# Patient Record
Sex: Male | Born: 2000
Health system: Southern US, Community
[De-identification: ages and names within clinical notes are randomized; demographics above are authoritative.]

## PROBLEM LIST (undated history)

## (undated) DIAGNOSIS — R0902 Hypoxemia: Secondary | ICD-10-CM

## (undated) DIAGNOSIS — D573 Sickle-cell trait: Secondary | ICD-10-CM

## (undated) DIAGNOSIS — F840 Autistic disorder: Secondary | ICD-10-CM

## (undated) DIAGNOSIS — T85598A Other mechanical complication of other gastrointestinal prosthetic devices, implants and grafts, initial encounter: Secondary | ICD-10-CM

## (undated) DIAGNOSIS — Z0189 Encounter for other specified special examinations: Secondary | ICD-10-CM

## (undated) DIAGNOSIS — Z978 Presence of other specified devices: Secondary | ICD-10-CM

## (undated) DIAGNOSIS — R569 Unspecified convulsions: Secondary | ICD-10-CM

## (undated) HISTORY — DX: Sickle-cell trait: D57.3

## (undated) HISTORY — DX: Autistic disorder: F84.0

## (undated) HISTORY — DX: Presence of other specified devices: Z97.8

## (undated) HISTORY — DX: Encounter for other specified special examinations: Z01.89

## (undated) HISTORY — DX: Other mechanical complication of other gastrointestinal prosthetic devices, implants and grafts, initial encounter: T85.598A

## (undated) HISTORY — DX: Hypoxemia: R09.02

---
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
## 2018-01-10 ENCOUNTER — Other Ambulatory Visit: Payer: Self-pay

## 2018-01-10 MED ORDER — LEVETIRACETAM 500 MG PO TABS: 500.0000 mg | ORAL_TABLET | Freq: Two times a day (BID) | ORAL | 3 refills | Status: DC

## 2018-01-10 NOTE — Telephone Encounter (Signed)
Pts mother called nurse line requesting a refill on pts Keppra. Please advise.

## 2018-01-14 ENCOUNTER — Inpatient Hospital Stay (HOSPITAL_COMMUNITY)
Admission: EM | Admit: 2018-01-14 | Discharge: 2018-01-19 | DRG: 208 | Disposition: A | Discharge status: Home / Self Care | Payer: Medicaid Other | Attending: Family Medicine | Admitting: Family Medicine

## 2018-01-14 ENCOUNTER — Emergency Department (HOSPITAL_COMMUNITY): Payer: Medicaid Other

## 2018-01-14 ENCOUNTER — Inpatient Hospital Stay (HOSPITAL_COMMUNITY): Payer: Medicaid Other

## 2018-01-14 ENCOUNTER — Encounter (HOSPITAL_COMMUNITY): Payer: Self-pay | Admitting: Emergency Medicine

## 2018-01-14 ENCOUNTER — Other Ambulatory Visit: Payer: Self-pay

## 2018-01-14 DIAGNOSIS — I959 Hypotension, unspecified: Secondary | ICD-10-CM | POA: Diagnosis present

## 2018-01-14 DIAGNOSIS — F840 Autistic disorder: Secondary | ICD-10-CM

## 2018-01-14 DIAGNOSIS — K5900 Constipation, unspecified: Secondary | ICD-10-CM | POA: Diagnosis not present

## 2018-01-14 DIAGNOSIS — D571 Sickle-cell disease without crisis: Secondary | ICD-10-CM | POA: Diagnosis not present

## 2018-01-14 DIAGNOSIS — G40909 Epilepsy, unspecified, not intractable, without status epilepticus: Secondary | ICD-10-CM | POA: Diagnosis present

## 2018-01-14 DIAGNOSIS — Z978 Presence of other specified devices: Secondary | ICD-10-CM

## 2018-01-14 DIAGNOSIS — E876 Hypokalemia: Secondary | ICD-10-CM | POA: Diagnosis not present

## 2018-01-14 DIAGNOSIS — E872 Acidosis: Secondary | ICD-10-CM | POA: Diagnosis not present

## 2018-01-14 DIAGNOSIS — Z8249 Family history of ischemic heart disease and other diseases of the circulatory system: Secondary | ICD-10-CM

## 2018-01-14 DIAGNOSIS — D573 Sickle-cell trait: Secondary | ICD-10-CM | POA: Diagnosis present

## 2018-01-14 DIAGNOSIS — R000 Tachycardia, unspecified: Secondary | ICD-10-CM | POA: Diagnosis not present

## 2018-01-14 DIAGNOSIS — J9601 Acute respiratory failure with hypoxia: Secondary | ICD-10-CM | POA: Diagnosis not present

## 2018-01-14 DIAGNOSIS — Z825 Family history of asthma and other chronic lower respiratory diseases: Secondary | ICD-10-CM | POA: Diagnosis not present

## 2018-01-14 DIAGNOSIS — R042 Hemoptysis: Secondary | ICD-10-CM | POA: Diagnosis present

## 2018-01-14 DIAGNOSIS — Z79899 Other long term (current) drug therapy: Secondary | ICD-10-CM | POA: Diagnosis not present

## 2018-01-14 DIAGNOSIS — R0603 Acute respiratory distress: Secondary | ICD-10-CM | POA: Diagnosis present

## 2018-01-14 DIAGNOSIS — R739 Hyperglycemia, unspecified: Secondary | ICD-10-CM | POA: Diagnosis not present

## 2018-01-14 DIAGNOSIS — Z781 Physical restraint status: Secondary | ICD-10-CM

## 2018-01-14 DIAGNOSIS — I10 Essential (primary) hypertension: Secondary | ICD-10-CM | POA: Diagnosis not present

## 2018-01-14 DIAGNOSIS — J189 Pneumonia, unspecified organism: Secondary | ICD-10-CM | POA: Diagnosis present

## 2018-01-14 DIAGNOSIS — J9690 Respiratory failure, unspecified, unspecified whether with hypoxia or hypercapnia: Secondary | ICD-10-CM

## 2018-01-14 DIAGNOSIS — R40241 Glasgow coma scale score 13-15, unspecified time: Secondary | ICD-10-CM | POA: Diagnosis present

## 2018-01-14 DIAGNOSIS — R569 Unspecified convulsions: Secondary | ICD-10-CM

## 2018-01-14 DIAGNOSIS — Z9289 Personal history of other medical treatment: Secondary | ICD-10-CM

## 2018-01-14 HISTORY — DX: Hemoptysis: R04.2

## 2018-01-14 LAB — I-STAT VENOUS BLOOD GAS, ED
Acid-base deficit: 2 mmol/L (ref 0.0–2.0)
Bicarbonate: 26.1 mmol/L (ref 20.0–28.0)
O2 Saturation: 59 %
TCO2: 28 mmol/L (ref 22–32)
pCO2, Ven: 55 mmHg (ref 44.0–60.0)
pH, Ven: 7.284 (ref 7.250–7.430)
pO2, Ven: 35 mmHg (ref 32.0–45.0)

## 2018-01-14 LAB — I-STAT ARTERIAL BLOOD GAS, ED
ACID-BASE DEFICIT: 5 mmol/L — AB (ref 0.0–2.0)
Bicarbonate: 25.4 mmol/L (ref 20.0–28.0)
O2 Saturation: 100 %
PO2 ART: 224 mmHg — AB (ref 83.0–108.0)
TCO2: 27 mmol/L (ref 22–32)
pCO2 arterial: 67.4 mmHg (ref 32.0–48.0)
pH, Arterial: 7.183 — CL (ref 7.350–7.450)

## 2018-01-14 LAB — COMPREHENSIVE METABOLIC PANEL
ALK PHOS: 92 U/L (ref 38–126)
ALT: 33 U/L (ref 0–44)
ANION GAP: 9 (ref 5–15)
AST: 33 U/L (ref 15–41)
Albumin: 3.6 g/dL (ref 3.5–5.0)
BUN: 9 mg/dL (ref 6–20)
CALCIUM: 9.3 mg/dL (ref 8.9–10.3)
CO2: 26 mmol/L (ref 22–32)
CREATININE: 1.08 mg/dL (ref 0.61–1.24)
Chloride: 103 mmol/L (ref 98–111)
GFR calc Af Amer: >60 mL/min (ref >60)
Glucose, Bld: 111 mg/dL — ABNORMAL HIGH (ref 70–99)
Potassium: 4 mmol/L (ref 3.5–5.1)
Sodium: 138 mmol/L (ref 135–145)
TOTAL PROTEIN: 7.1 g/dL (ref 6.5–8.1)
Total Bilirubin: 0.5 mg/dL (ref 0.3–1.2)

## 2018-01-14 LAB — CBC WITH DIFFERENTIAL/PLATELET
Abs Immature Granulocytes: 0.1 10*3/uL (ref 0.0–0.1)
BASOS ABS: 0 10*3/uL (ref 0.0–0.1)
BASOS PCT: 0 %
EOS ABS: 0 10*3/uL (ref 0.0–0.7)
Eosinophils Relative: 0 %
HCT: 47.1 % (ref 39.0–52.0)
Hemoglobin: 15 g/dL (ref 13.0–17.0)
IMMATURE GRANULOCYTES: 1 %
Lymphocytes Relative: 32 %
Lymphs Abs: 3 10*3/uL (ref 0.7–4.0)
MCH: 24.9 pg — ABNORMAL LOW (ref 26.0–34.0)
MCHC: 31.8 g/dL (ref 30.0–36.0)
MCV: 78.1 fL (ref 78.0–100.0)
MONOS PCT: 8 %
Monocytes Absolute: 0.8 10*3/uL (ref 0.1–1.0)
NEUTROS PCT: 59 %
Neutro Abs: 5.4 10*3/uL (ref 1.7–7.7)
PLATELETS: 292 10*3/uL (ref 150–400)
RBC: 6.03 MIL/uL — AB (ref 4.22–5.81)
RDW: 15.9 % — AB (ref 11.5–15.5)
WBC: 9.2 10*3/uL (ref 4.0–10.5)

## 2018-01-14 LAB — GLUCOSE, CAPILLARY: GLUCOSE-CAPILLARY: 126 mg/dL — AB (ref 70–99)

## 2018-01-14 LAB — C-REACTIVE PROTEIN: CRP: 0.8 mg/dL (ref <1.0)

## 2018-01-14 LAB — PROTIME-INR
INR: 1.12
PROTHROMBIN TIME: 14.3 s (ref 11.4–15.2)

## 2018-01-14 LAB — D-DIMER, QUANTITATIVE (NOT AT ARMC): D DIMER QUANT: 4.47 ug{FEU}/mL — AB (ref 0.00–0.50)

## 2018-01-14 LAB — TRIGLYCERIDES: Triglycerides: 184 mg/dL — ABNORMAL HIGH (ref <150)

## 2018-01-14 LAB — LACTIC ACID, PLASMA: LACTIC ACID, VENOUS: 2.9 mmol/L — AB (ref 0.5–1.9)

## 2018-01-14 LAB — PROCALCITONIN

## 2018-01-14 LAB — I-STAT CG4 LACTIC ACID, ED: Lactic Acid, Venous: 3.35 mmol/L (ref 0.5–1.9)

## 2018-01-14 LAB — SEDIMENTATION RATE: Sed Rate: 1 mm/hr (ref 0–16)

## 2018-01-14 MED ORDER — FENTANYL CITRATE (PF) 100 MCG/2ML IJ SOLN: 50.0000 ug | Freq: Once | INTRAMUSCULAR | Status: DC

## 2018-01-14 MED ORDER — DOCUSATE SODIUM 50 MG/5ML PO LIQD: 100.0000 mg | Freq: Two times a day (BID) | ORAL | Status: DC | PRN

## 2018-01-14 MED ORDER — SODIUM CHLORIDE 0.9 % IV SOLN: 1.0000 g | Freq: Once | INTRAVENOUS | Status: DC

## 2018-01-14 MED ORDER — PROPOFOL 10 MG/ML IV BOLUS
INTRAVENOUS | Status: AC
  Filled 2018-01-14: qty 20

## 2018-01-14 MED ORDER — FENTANYL BOLUS VIA INFUSION
50.0000 ug | INTRAVENOUS | Status: DC | PRN
  Administered 2018-01-15 – 2018-01-17 (×6): 50 ug via INTRAVENOUS
  Filled 2018-01-14: qty 50

## 2018-01-14 MED ORDER — VANCOMYCIN HCL IN DEXTROSE 1-5 GM/200ML-% IV SOLN: 1000.0000 mg | Freq: Three times a day (TID) | INTRAVENOUS | Status: DC

## 2018-01-14 MED ORDER — PROPOFOL 1000 MG/100ML IV EMUL
5.0000 ug/kg/min | INTRAVENOUS | Status: DC
  Administered 2018-01-14: 1 mL via INTRAVENOUS
  Administered 2018-01-14: 20 ug/kg/min via INTRAVENOUS
  Filled 2018-01-14: qty 100

## 2018-01-14 MED ORDER — ROCURONIUM BROMIDE 50 MG/5ML IV SOLN
100.0000 mg | Freq: Once | INTRAVENOUS | Status: AC
  Administered 2018-01-14: 100 mg via INTRAVENOUS

## 2018-01-14 MED ORDER — IOPAMIDOL (ISOVUE-370) INJECTION 76%
100.0000 mL | Freq: Once | INTRAVENOUS | Status: AC | PRN
  Administered 2018-01-14: 79 mL via INTRAVENOUS

## 2018-01-14 MED ORDER — PIPERACILLIN-TAZOBACTAM 3.375 G IVPB 30 MIN
3.3750 g | Freq: Once | INTRAVENOUS | Status: AC
  Administered 2018-01-14: 3.375 g via INTRAVENOUS
  Filled 2018-01-14: qty 50

## 2018-01-14 MED ORDER — SODIUM CHLORIDE 0.9 % IV SOLN: 500.0000 mg | Freq: Once | INTRAVENOUS | Status: DC

## 2018-01-14 MED ORDER — SODIUM CHLORIDE 0.9 % IV BOLUS
500.0000 mL | Freq: Once | INTRAVENOUS | Status: AC
  Administered 2018-01-14: 500 mL via INTRAVENOUS

## 2018-01-14 MED ORDER — MIDAZOLAM HCL 2 MG/2ML IJ SOLN
2.0000 mg | INTRAMUSCULAR | Status: DC | PRN
  Administered 2018-01-15: 2 mg via INTRAVENOUS
  Filled 2018-01-14 (×2): qty 2

## 2018-01-14 MED ORDER — LORAZEPAM 2 MG/ML IJ SOLN: 0.5000 mg | Freq: Four times a day (QID) | INTRAMUSCULAR | Status: DC | PRN

## 2018-01-14 MED ORDER — PROPOFOL 1000 MG/100ML IV EMUL
INTRAVENOUS | Status: AC
  Filled 2018-01-14: qty 100

## 2018-01-14 MED ORDER — SODIUM CHLORIDE 0.9 % IV SOLN: INTRAVENOUS | Status: DC | PRN

## 2018-01-14 MED ORDER — LEVETIRACETAM IN NACL 500 MG/100ML IV SOLN
500.0000 mg | Freq: Once | INTRAVENOUS | Status: DC
  Filled 2018-01-14: qty 100

## 2018-01-14 MED ORDER — SODIUM CHLORIDE 0.9 % IV SOLN
2000.0000 mg | Freq: Once | INTRAVENOUS | Status: DC
  Filled 2018-01-14: qty 2000

## 2018-01-14 MED ORDER — PROPOFOL 10 MG/ML IV BOLUS
100.0000 mg | Freq: Once | INTRAVENOUS | Status: AC
  Administered 2018-01-14: 100 mg via INTRAVENOUS

## 2018-01-14 MED ORDER — PIPERACILLIN-TAZOBACTAM 3.375 G IVPB
3.3750 g | Freq: Three times a day (TID) | INTRAVENOUS | Status: DC
  Administered 2018-01-15 – 2018-01-18 (×10): 3.375 g via INTRAVENOUS
  Filled 2018-01-14 (×11): qty 50

## 2018-01-14 MED ORDER — FENTANYL 2500MCG IN NS 250ML (10MCG/ML) PREMIX INFUSION
0.0000 ug/h | INTRAVENOUS | Status: DC
Start: 2018-01-14 — End: 2018-01-14

## 2018-01-14 MED ORDER — LORAZEPAM 2 MG/ML IJ SOLN
INTRAMUSCULAR | Status: AC
  Filled 2018-01-14: qty 1

## 2018-01-14 MED ORDER — FENTANYL 2500MCG IN NS 250ML (10MCG/ML) PREMIX INFUSION
25.0000 ug/h | INTRAVENOUS | Status: DC
  Administered 2018-01-14 – 2018-01-15 (×2): 400 ug/h via INTRAVENOUS
  Administered 2018-01-15: 300 ug/h via INTRAVENOUS
  Administered 2018-01-15: 400 ug/h via INTRAVENOUS
  Administered 2018-01-16: 300 ug/h via INTRAVENOUS
  Administered 2018-01-16: 175 ug/h via INTRAVENOUS
  Administered 2018-01-17: 250 ug/h via INTRAVENOUS
  Filled 2018-01-14 (×6): qty 250

## 2018-01-14 MED ORDER — LEVETIRACETAM IN NACL 500 MG/100ML IV SOLN
500.0000 mg | Freq: Two times a day (BID) | INTRAVENOUS | Status: DC
  Administered 2018-01-14 – 2018-01-15 (×2): 500 mg via INTRAVENOUS
  Filled 2018-01-14 (×2): qty 100

## 2018-01-14 MED ORDER — SODIUM CHLORIDE 0.9 % IV SOLN
INTRAVENOUS | Status: DC
  Administered 2018-01-14 – 2018-01-16 (×4): via INTRAVENOUS

## 2018-01-14 MED ORDER — PIPERACILLIN-TAZOBACTAM 3.375 G IVPB: 3.3750 g | Freq: Three times a day (TID) | INTRAVENOUS | Status: DC

## 2018-01-14 MED ORDER — FENTANYL CITRATE (PF) 100 MCG/2ML IJ SOLN
50.0000 ug | Freq: Once | INTRAMUSCULAR | Status: DC
  Filled 2018-01-14: qty 2

## 2018-01-14 MED ORDER — FENTANYL CITRATE (PF) 100 MCG/2ML IJ SOLN
100.0000 ug | Freq: Once | INTRAMUSCULAR | Status: AC
  Administered 2018-01-14: 100 ug via INTRAVENOUS
  Filled 2018-01-14: qty 2

## 2018-01-14 MED ORDER — PROPOFOL 1000 MG/100ML IV EMUL
0.0000 ug/kg/min | INTRAVENOUS | Status: DC
  Administered 2018-01-14 – 2018-01-15 (×4): 50 ug/kg/min via INTRAVENOUS
  Filled 2018-01-14 (×2): qty 100

## 2018-01-14 MED ORDER — FENTANYL 2500MCG IN NS 250ML (10MCG/ML) PREMIX INFUSION
25.0000 ug/h | INTRAVENOUS | Status: DC
  Administered 2018-01-14: 100 ug/h via INTRAVENOUS
  Filled 2018-01-14: qty 250

## 2018-01-14 MED ORDER — SODIUM CHLORIDE 0.9 % IV SOLN: 250.0000 mL | INTRAVENOUS | Status: DC | PRN

## 2018-01-14 MED ORDER — FENTANYL BOLUS VIA INFUSION
50.0000 ug | INTRAVENOUS | Status: DC | PRN
  Filled 2018-01-14: qty 50

## 2018-01-14 MED ORDER — MIDAZOLAM HCL 2 MG/2ML IJ SOLN: 2.0000 mg | INTRAMUSCULAR | Status: DC | PRN

## 2018-01-14 MED ORDER — IPRATROPIUM-ALBUTEROL 0.5-2.5 (3) MG/3ML IN SOLN
RESPIRATORY_TRACT | Status: AC
  Administered 2018-01-14: 3 mL via RESPIRATORY_TRACT
  Filled 2018-01-14: qty 3

## 2018-01-14 MED ORDER — IPRATROPIUM-ALBUTEROL 0.5-2.5 (3) MG/3ML IN SOLN
3.0000 mL | Freq: Once | RESPIRATORY_TRACT | Status: AC
  Administered 2018-01-14: 3 mL via RESPIRATORY_TRACT

## 2018-01-14 MED ORDER — METHYLPREDNISOLONE SODIUM SUCC 125 MG IJ SOLR
125.0000 mg | Freq: Four times a day (QID) | INTRAMUSCULAR | Status: DC
  Administered 2018-01-14 – 2018-01-17 (×11): 125 mg via INTRAVENOUS
  Filled 2018-01-14 (×12): qty 2

## 2018-01-14 MED ORDER — LORAZEPAM 2 MG/ML IJ SOLN
1.0000 mg | Freq: Once | INTRAMUSCULAR | Status: AC
  Administered 2018-01-14: 1 mg via INTRAMUSCULAR

## 2018-01-14 NOTE — ED Notes (Signed)
Critical Care at bedside.  

## 2018-01-14 NOTE — H&P (Addendum)
.. ..  Name: Billy Ayala MRN: [id number] DOB: 2000-10-27    ADMISSION DATE:  01/14/2018 CONSULTATION DATE:  01/14/18  REFERRING MD :  EDP  CHIEF COMPLAINT:  Seizure and hemoptysis  BRIEF PATIENT DESCRIPTION: 17 yr old male with PMHx significant for Sickle cell trait and Autism presents after found having a seizure that lasted 1 minute. He was confused post event and on presentation to the ED began having hemoptysis and SOB. PCCM consulted.  SIGNIFICANT EVENTS  Seizure SOB  STUDIES:  CXR CT chest   HISTORY OF PRESENT ILLNESS:  (History obtained from EMR, patient's family including mother and grandmother and the account of other providers)  17 yr old male with PMHx significant for Sickle cell trait and Autism presents after found having a seizure that lasted 1 minute. He was confused post event for a total of 15 mins. On presentation to the ED began having hemoptysis and SOB. PCCM consulted.  Per the patient's mother he was at his baseline usual health since his last admission in June with no complaints.  A few days ago he had an abdominal pain but she denies any hematemesis or melena, diarrhea or constipation.  She reports that the patent was able to attend family events and church functions with no problem. He attended church today with no issues, it was not until afterwards his father found him on the floor with both legs shaking and not responding. He was altered for fifteen minutes following the event. He was brought to the ED  His mom reports compliance with his Keppra and states that they had a Neuro appointment for August (which was the first available). She states that there was no planned follow-up with Pulmonology. It was not until he arrived to the ED that he began having hemoptysis.   PAST MEDICAL HISTORY :   has a past medical history of Autism and Sickle cell trait (HCC).  has no past surgical history on file. Prior to Admission medications   Medication Sig Start  Date End Date Taking? Authorizing Provider  levETIRAcetam (KEPPRA) 500 MG tablet Take 1 tablet (500 mg total) by mouth 2 (two) times daily. 01/10/18  Yes Riccio, Marylene Land C, DO   No Known Allergies  FAMILY HISTORY:  family history includes Asthma in his mother and sister; Hypertension in his mother. SOCIAL HISTORY:  reports that he has never smoked. He has never used smokeless tobacco.  REVIEW OF SYSTEMS:  ( unable to obtain prior to intubation not very verbal and not c/o pain) Constitutional: Negative for fever, chills, weight loss, malaise/fatigue and diaphoresis.  HENT: Negative for hearing loss, ear pain, nosebleeds, congestion, sore throat, neck pain, tinnitus and ear discharge.   Eyes: Negative for blurred vision, double vision, photophobia, pain, discharge and redness.  Respiratory: Negative for cough, hemoptysis, sputum production, shortness of breath, wheezing and stridor.   Cardiovascular: Negative for chest pain, palpitations, orthopnea, claudication, leg swelling and PND.  Gastrointestinal: Negative for heartburn, nausea, vomiting, abdominal pain, diarrhea, constipation, blood in stool and melena.  Genitourinary: Negative for dysuria, urgency, frequency, hematuria and flank pain.  Musculoskeletal: Negative for myalgias, back pain, joint pain and falls.  Skin: Negative for itching and rash.  Neurological: Negative for dizziness, tingling, tremors, sensory change, speech change, focal weakness, seizures, loss of consciousness, weakness and headaches.  Endo/Heme/Allergies: Negative for environmental allergies and polydipsia. Does not bruise/bleed easily.  SUBJECTIVE:   VITAL SIGNS: Temp:  [99.8 F (37.7 C)] 99.8 F (37.7 C) (07/14 1849) Pulse  Rate:  [116-139] 126 (07/14 2155) Resp:  [16-55] 19 (07/14 2155) BP: (124-167)/(69-97) 131/69 (07/14 2155) SpO2:  [85 %-100 %] 100 % (07/14 2155) FiO2 (%):  [90 %] 90 % (07/14 2125) Weight:  [99.8 kg (220 lb)] 99.8 kg (220 lb) (07/14  1849)  PHYSICAL EXAMINATION: General:  Intubated and sedated Neuro:  GCS 15 prior to intubation, AAO followed commands. No focal deficits HEENT:  NCAT, ETT in oropharynx. Fundoscopic exam shows tortuous vessels no exudates or hemorrhages noted. Also examined both nares boggy mucosa small amount of blood in left nare no polyps. Cardiovascular:  Tachycardic regular rhythm S1 and S2 appreciated Lungs:  Coarse breath sounds bilaterally Abdomen:  Soft, nontender + BS Musculoskeletal:  No atrophy, no lower ext edema Skin:  Minor abrasion to posterior aspect of the left shoulder otherwise grossly intact  Recent Labs  Lab 01/14/18 1925  NA 138  K 4.0  CL 103  CO2 26  BUN 9  CREATININE 1.08  GLUCOSE 111*   Recent Labs  Lab 01/14/18 1925  HGB 15.0  HCT 47.1  WBC 9.2  PLT 292   Dg Chest Portable 1 View  Result Date: 01/14/2018 CLINICAL DATA:  Pneumonia. Evaluate endotracheal and nasogastric tube placements. EXAM: PORTABLE CHEST 1 VIEW COMPARISON:  Earlier today at 1915 hours. FINDINGS: 2146 hours. Endotracheal tube terminates 3.7 cm cm above carina.Nasogastric tube is looped in the stomach with the side port extending below the inferior aspect of the film. Normal heart size. Mild right hemidiaphragm elevation. No pleural effusion or pneumothorax. Right greater than left airspace disease may be slightly improved on the left. Relative sparing of the subpleural spaces. IMPRESSION: Appropriate position of endotracheal tube. Nasogastric tube extends beyond the inferior aspect of the film. Multifocal airspace disease, with possible improvement on the left. Electronically Signed   By: Jeronimo GreavesKyle  Talbot M.D.   On: 01/14/2018 22:09   Dg Chest Port 1 View  Result Date: 01/14/2018 CLINICAL DATA:  Cough, dyspnea and seizure.  Hemoptysis. EXAM: PORTABLE CHEST 1 VIEW COMPARISON:  12/16/2017 FINDINGS: The heart size and mediastinal contours are within normal limits. Multilobar airspace opacities throughout  both lungs consistent with multilobar pneumonia possibly related to aspiration given history of seizure. Given history of hemoptysis, pulmonary hemorrhage or stigmata of ARDS might also account for some of the opacities seen. Stigmata of pulmonary edema is within the differential as well. The visualized skeletal structures are unremarkable. IMPRESSION: Multilobar airspace disease compatible with multilobar pneumonia possibly aspiration related given history of seizure. Other possibilities may include pulmonary hemorrhage, ARDS and/or pulmonary edema. Electronically Signed   By: Tollie Ethavid  Kwon M.D.   On: 01/14/2018 19:48    ASSESSMENT / PLAN: NEURO: Seizure disorder vs syncopal episode Could be secondary to hypoxia Reviewed Neurology notes from previous admission, EEG and MRI Continue Keppra  GCS 15 Neurochecks Seizure precautions Sedation Propofol ggt and fentanyl ggt Continue with restraints  CARDIAC: Hemodynamically stable Not on any vasopressors ECHO from June 2019: EF 60-65% no evidence of systolic or diastolic dysfunction PA pressure was 37 Continue Cardiac monitoring Tachycardia - explained by inadequate sedation - HR improved with Fentanyl   PULMONARY: Acute Hypoxic Respiratory Failure Hemoptysis Diffuse Alveolar Hemorrhage? Prev bronch with serial bloody aliquots  Previous workup in June-> after high dose steroids patient had rapid improvement  No etiology was determined -  Repeated autoimmune workup No evidence of renal involvement Intubated requiring PEEP 10 TV 8 cc/kg Respiratory acidosis on initial ABG CTA- R/O PE- pending official read  Started on high dose steroids in the setting of suspected hemorrhage   ID: Sepsis- Sent following to r/o active infection RVP Blood cx UA PCT Will continue on empiric antibiotics  Endocrine: No h/o insulin dependence Check TSH If BG exceeds 180mg /dl - Phas 1 ICU gylcemic protocol in setting of high dose steroids- may need  SSI   GI: NPO OGT placed If pt remains intubated for >24 hrs will evaluate for TF H/o abdominal pain- post OGT placement no dark output from tube No signs of an active GIB Started on Pepcid for ppx  Heme: Possibly Diffuse Alveolar Hemorrhage ( autoimmune?)- sent full workup Noted family h/o scleroderma ..hemoglobin 15 ..platelets 292 Hgb<7 transfuse PRBCs Active hemoptysis No h/o coagulopathy DVT PPx-> SCDs and Lovenox [place]  RENAL Will check UA for protein In setting of alveolar hemorrhage r/o renal involvement No h/o CKD Lab Results  Component Value Date   CREATININE 1.08 01/14/2018   CREATININE 0.69 12/16/2017   CREATININE 0.80 12/15/2017  check electrolytes - goal K= 4, Mg 2 Phos 2 Pt did received IV contrast for PE study- continue on gentle IV hydration Will place indwelling foley catheter - critically ill pt for 1st 24 hrs.    I, Dr Newell Coral have personally reviewed patient's available data, including medical history, events of note, physical examination and test results as part of my evaluation. I have discussed with NP Eubanksand other care providers such as RNs and EDP. The patient is critically ill with multiple organ systems failure and requires high complexity decision making for assessment and support, frequent evaluation and titration of therapies, application of advanced monitoring technologies and extensive interpretation of multiple databases. Critical Care Time devoted to patient care services described in this note is 75 Minutes. This time reflects time of care of this signee Dr Newell Coral. This critical care time does not reflect procedure time, or teaching time or supervisory time of NP but could involve care discussion time    DISPOSITION: ICU CC TIME: 75 mins CODE STATUS: full PROGNOSIS: Guarded FAMILY:Mother and Grandmother at bedside. Mother describes that it is difficult for patient to communicate when something is wrong due to his  autism. She states that he has been well and returned to his baseline since the last admission. She expressed concern regarding his prognosis. We had an interactive dialogue regarding management, workup and expectations, she expressed understanding and agreement with care plan.   Signed Dr Newell Coral Pulmonary Critical Care Locums  01/14/2018, 10:40 PM

## 2018-01-14 NOTE — ED Notes (Signed)
I Stat [place] Acid results of 3.35 reported to Dole FoodShawn Joy PA

## 2018-01-14 NOTE — Procedures (Signed)
Intubation Procedure Note Billy Ayala [id number] [birth year]  Procedure: Intubation Indications: Airway protection and maintenance  Procedure Details Consent: Risks of procedure as well as the alternatives and risks of each were explained to the (patient/caregiver).  Consent for procedure obtained. Time Out: Verified patient identification, verified procedure, site/side was marked, verified correct patient position, special equipment/implants available, medications/allergies/relevent history reviewed, required imaging and test results available.  Performed  Maximum sterile technique was used including gloves.  MAC    Evaluation Hemodynamic Status: BP stable throughout; O2 sats: stable throughout Patient's Current Condition: stable Complications: No apparent complications Patient did tolerate procedure well. Chest X-ray ordered to verify placement.  CXR: tube position acceptable.   Chriss Driver [facility] 01/14/2018

## 2018-01-14 NOTE — ED Provider Notes (Signed)
[facility] Provider Note   CSN: [id number] Arrival date & time: 01/14/18  [id number]     History   Chief Complaint Chief Complaint  Patient presents with  . Seizures    HPI Billy Ayala is a 17 y.o. male.  HPI   Level V caveat due to patient acuity.   Billy Ayala is a 17 y.o. male, with a history of autism, presenting to the ED with reported seizure that occurred around 6 PM this evening.  Patient's father walked into the room and noted patient was lying on his back on the floor with full body shaking.  This shaking lasted for approximately 1 minute.  Patient was confused and disoriented for approximately 15 minutes afterward.  Patient is accompanied by his mother at the bedside who states patient had his first seizure June 10.  He was hospitalized at that time for pneumonia and respiratory failure.  He was placed on Keppra and told to follow-up with neurology.  They have an appointment in August, which was the earliest available.  Upon arrival in ED, patient had an episode of hemoptysis.  Complaining of shortness of breath.    Past Medical History:  Diagnosis Date  . Autism   . Sickle cell trait [facility](HCC)     Patient Active Problem List   Diagnosis Date Noted  . Seizure-like activity (HCC) 12/21/2017  . Aspiration pneumonia of both lungs due to gastric secretions (HCC)   . Obesity 08/25/2011  . Active autistic disorder 09/17/2007  . SICKLE CELL TRAIT 08/31/2006    No past surgical history on file.      Home Medications    Prior to Admission medications   Medication Sig Start Date End Date Taking? Authorizing Provider  levETIRAcetam (KEPPRA) 500 MG tablet Take 1 tablet (500 mg total) by mouth 2 (two) times daily. 01/10/18  Yes Tillman Sersiccio, Angela C, DO    Family History Family History  Problem Relation Age of Onset  . Asthma Mother   . Hypertension Mother   . Asthma Sister   . Early death Neg Hx     Social  History Social History   Tobacco Use  . Smoking status: Never Smoker  . Smokeless tobacco: Never Used  Substance Use Topics  . Alcohol use: Not on file  . Drug use: Not on file     Allergies   Patient has no known allergies.   Review of Systems Review of Systems  Unable to perform ROS: Acuity of condition     Physical Exam Updated Vital Signs Pulse (!) 128   Temp 99.8 F (37.7 C)   Resp (!) 55   SpO2 96%   Physical Exam  Constitutional: He appears well-developed and well-nourished. No distress.  HENT:  Head: Normocephalic and atraumatic.  Mouth/Throat: Oropharynx is clear and moist.  Airway appears clear.  No noted intraoral trauma or swelling.  No noted active hemorrhage.  Eyes: Conjunctivae are normal.  Neck: Neck supple.  Cardiovascular: Regular rhythm, normal heart sounds and intact distal pulses. Tachycardia present.  Pulmonary/Chest: Tachypnea noted. He is in respiratory distress. He has rhonchi (global).  Abdominal: Soft. There is no tenderness. There is no guarding.  Musculoskeletal: He exhibits no edema.  Lymphadenopathy:    He has no cervical adenopathy.  Neurological: He is alert.  Skin: Skin is warm and dry. He is not diaphoretic.     Hot to the touch  Psychiatric: He has a normal mood and affect. His  behavior is normal.  Nursing note and vitals reviewed.    ED Treatments / Results  Labs (all labs ordered are listed, but only abnormal results are displayed) Labs Reviewed  COMPREHENSIVE METABOLIC PANEL - Abnormal; Notable for the following components:      Result Value   Glucose, Bld 111 (*)    All other components within normal limits  CBC WITH DIFFERENTIAL/PLATELET - Abnormal; Notable for the following components:   RBC 6.03 (*)    MCH 24.9 (*)    RDW 15.9 (*)    All other components within normal limits  I-STAT CG4 LACTIC ACID, ED - Abnormal; Notable for the following components:   Lactic Acid, Venous 3.35 (*)    All other components  within normal limits  CULTURE, BLOOD (ROUTINE X 2)  CULTURE, BLOOD (ROUTINE X 2)  URINALYSIS, ROUTINE W REFLEX MICROSCOPIC  PROCALCITONIN  PROTIME-INR  D-DIMER, QUANTITATIVE (NOT AT [facility]  LEVETIRACETAM LEVEL  BASIC METABOLIC PANEL  LACTIC ACID, PLASMA  LACTIC ACID, PLASMA  I-STAT VENOUS BLOOD GAS, ED    EKG None  Radiology Dg Chest Port 1 View  Result Date: 01/14/2018 CLINICAL DATA:  Cough, dyspnea and seizure.  Hemoptysis. EXAM: PORTABLE CHEST 1 VIEW COMPARISON:  12/16/2017 FINDINGS: The heart size and mediastinal contours are within normal limits. Multilobar airspace opacities throughout both lungs consistent with multilobar pneumonia possibly related to aspiration given history of seizure. Given history of hemoptysis, pulmonary hemorrhage or stigmata of ARDS might also account for some of the opacities seen. Stigmata of pulmonary edema is within the differential as well. The visualized skeletal structures are unremarkable. IMPRESSION: Multilobar airspace disease compatible with multilobar pneumonia possibly aspiration related given history of seizure. Other possibilities may include pulmonary hemorrhage, ARDS and/or pulmonary edema. Electronically Signed   By: Tollie Eth M.D.   On: 01/14/2018 19:48    Procedures .Critical Care Performed by: Anselm Pancoast, PA-C Authorized by: Anselm Pancoast, PA-C   Critical care provider statement:    Critical care time (minutes):  35   Critical care time was exclusive of:  Separately billable procedures and treating other patients   Critical care was necessary to treat or prevent imminent or life-threatening deterioration of the following conditions:  Respiratory failure   Critical care was time spent personally by me on the following activities:  Development of treatment plan with patient or surrogate, discussions with consultants, evaluation of patient's response to treatment, examination of patient, obtaining history from patient or surrogate,  ordering and performing treatments and interventions, ordering and review of laboratory studies, ordering and review of radiographic studies, pulse oximetry, re-evaluation of patient's condition and review of old charts   I assumed direction of critical care for this patient from another provider in my specialty: no     (including critical care time)  Medications Ordered in ED Medications  levETIRAcetam (KEPPRA) IVPB 500 mg/100 mL premix (has no administration in time range)  methylPREDNISolone sodium succinate (SOLU-MEDROL) 125 mg/2 mL injection 125 mg (has no administration in time range)  LORazepam (ATIVAN) injection 1 mg (1 mg Intramuscular Given 01/14/18 1913)  ipratropium-albuterol (DUONEB) 0.5-2.5 (3) MG/3ML nebulizer solution 3 mL (3 mLs Nebulization Given 01/14/18 1914)     Initial Impression / Assessment and Plan / ED Course  I have reviewed the triage vital signs and the nursing notes.  Pertinent labs & imaging results that were available during my care of the patient were reviewed by me and considered in my medical decision making (see  chart for details).  Clinical Course as of Jan 15 2035  Wynelle Link Jan 14, 2018  2025 Critical care took over patient care   [SJ]    Clinical Course User Index [SJ] Anselm Pancoast, [place]    Patient presents following seizure-like activity.  He arrived in respiratory distress, tachypneic, and tachycardic.  Room air SPO2 75 to 85%.  He also had episodes of hemoptysis here in the ED.  He was able to maintain his own airway and showed improvement in SPO2 on nonrebreather.  Despite improvement in oxygenation, patient still had agitation with attempts to use the already established IV.  Ativan was used to attempt to calm the patient first, but then soft restraints were needed for short amount of time.  They were removed at the earliest possible moment.   CXR consistent with ARDS versus multifocal pneumonia versus hemorrhage.  Aspiration prior to arrival is a  possibility.  Patient admitted via critical care.  Findings and plan of care discussed with Loren Racer, MD. Dr. Ranae Palms personally evaluated and examined this patient.  Vitals:   01/14/18 1912 01/14/18 1916 01/14/18 1945 01/14/18 2000  BP:   (!) 131/92 (!) 131/93  Pulse: (!) 128  (!) 116 (!) 122  Resp: (!) 55  (!) 39 (!) 47  Temp:      SpO2: 96% 90% 99% 100%  Weight:      Height:         Final Clinical Impressions(s) / ED Diagnoses   Final diagnoses:  Seizure-like activity [facility])  Respiratory distress  Hemoptysis    ED Discharge Orders    None       Concepcion Living 01/14/18 2040    Loren Racer, MD 01/14/18 2319

## 2018-01-14 NOTE — ED Triage Notes (Signed)
Patient had witnessed seizure lasted approx 1 min by father. Per mother this was second seizure in last few months. Upon arrival patient coughed up blood.

## 2018-01-14 NOTE — Progress Notes (Addendum)
Pharmacy Antibiotic Note  Billy Ayala is a 17 y.o. male admitted on 01/14/2018 with pneumonia.  Pharmacy has been originally consulted for ceftriaxone, azithromycin, and vancomycin dosing >> changed to zosyn therapy.  WBC WNL, LA 3.35, afebrile. Presents following seizure. CXR showing multilobar airspace disease compatible with possible aspiration.   Plan: Start zosyn 3.375 g IV every 8 hours via extended infusion Monitor renal function, clinical pic, cx results.  Height: 6\' 1"  (185.4 cm) Weight: 220 lb (99.8 kg) IBW/kg (Calculated) : 79.9  Temp (24hrs), Avg:99.8 F (37.7 C), Min:99.8 F (37.7 C), Max:99.8 F (37.7 C)  Recent Labs  Lab 01/14/18 1925 01/14/18 1933  WBC 9.2  --   CREATININE 1.08  --   LATICACIDVEN  --  3.35*    Estimated Creatinine Clearance: 137.9 mL/min (by C-G formula based on SCr of 1.08 mg/dL).    No Known Allergies  Antimicrobials this admission: Zosyn 7/14>>  Dose adjustments this admission: N/A  Microbiology results: 7/14 BCx: sent  Thank you for allowing pharmacy to be a part of this patient's care.  Girard CooterKimberly Perkins, PharmD Clinical Pharmacist  Pager: [phone] Phone: [phone] 01/14/2018 8:24 PM

## 2018-01-15 DIAGNOSIS — J9601 Acute respiratory failure with hypoxia: Principal | ICD-10-CM

## 2018-01-15 LAB — URINALYSIS, ROUTINE W REFLEX MICROSCOPIC
BACTERIA UA: NONE SEEN
BILIRUBIN URINE: NEGATIVE
Glucose, UA: NEGATIVE mg/dL
KETONES UR: NEGATIVE mg/dL
LEUKOCYTES UA: NEGATIVE
Nitrite: NEGATIVE
Protein, ur: NEGATIVE mg/dL
Specific Gravity, Urine: 1.018 (ref 1.005–1.030)
pH: 5 (ref 5.0–8.0)

## 2018-01-15 LAB — POCT I-STAT 3, ART BLOOD GAS (G3+)
ACID-BASE DEFICIT: 2 mmol/L (ref 0.0–2.0)
ACID-BASE DEFICIT: 4 mmol/L — AB (ref 0.0–2.0)
ACID-BASE DEFICIT: 3 mmol/L — AB (ref 0.0–2.0)
Acid-base deficit: 6 mmol/L — ABNORMAL HIGH (ref 0.0–2.0)
BICARBONATE: 20.8 mmol/L (ref 20.0–28.0)
Bicarbonate: 24 mmol/L (ref 20.0–28.0)
Bicarbonate: 23 mmol/L (ref 20.0–28.0)
Bicarbonate: 20.8 mmol/L (ref 20.0–28.0)
O2 SAT: 92 %
O2 Saturation: 100 %
O2 Saturation: 99 %
O2 Saturation: 98 %
PCO2 ART: 46.6 mmHg (ref 32.0–48.0)
PCO2 ART: 43.8 mmHg (ref 32.0–48.0)
PH ART: 7.302 — AB (ref 7.350–7.450)
PH ART: 7.285 — AB (ref 7.350–7.450)
PH ART: 7.392 (ref 7.350–7.450)
PO2 ART: 483 mmHg — AB (ref 83.0–108.0)
PO2 ART: 153 mmHg — AB (ref 83.0–108.0)
PO2 ART: 109 mmHg — AB (ref 83.0–108.0)
Patient temperature: 98.6
Patient temperature: 98.6
TCO2: 25 mmol/L (ref 22–32)
TCO2: 24 mmol/L (ref 22–32)
TCO2: 22 mmol/L (ref 22–32)
TCO2: 22 mmol/L (ref 22–32)
pCO2 arterial: 46.8 mmHg (ref 32.0–48.0)
pCO2 arterial: 34.1 mmHg (ref 32.0–48.0)
pH, Arterial: 7.317 — ABNORMAL LOW (ref 7.350–7.450)
pO2, Arterial: 72 mmHg — ABNORMAL LOW (ref 83.0–108.0)

## 2018-01-15 LAB — CBC
HEMATOCRIT: 42.1 % (ref 39.0–52.0)
HEMOGLOBIN: 13.7 g/dL (ref 13.0–17.0)
MCH: 25.1 pg — AB (ref 26.0–34.0)
MCHC: 32.5 g/dL (ref 30.0–36.0)
MCV: 77.1 fL — ABNORMAL LOW (ref 78.0–100.0)
Platelets: 262 10*3/uL (ref 150–400)
RBC: 5.46 MIL/uL (ref 4.22–5.81)
RDW: 15.6 % — ABNORMAL HIGH (ref 11.5–15.5)
WBC: 21.4 10*3/uL — ABNORMAL HIGH (ref 4.0–10.5)

## 2018-01-15 LAB — GLUCOSE, CAPILLARY
GLUCOSE-CAPILLARY: 140 mg/dL — AB (ref 70–99)
GLUCOSE-CAPILLARY: 126 mg/dL — AB (ref 70–99)
Glucose-Capillary: 142 mg/dL — ABNORMAL HIGH (ref 70–99)
Glucose-Capillary: 132 mg/dL — ABNORMAL HIGH (ref 70–99)
Glucose-Capillary: 153 mg/dL — ABNORMAL HIGH (ref 70–99)
Glucose-Capillary: 154 mg/dL — ABNORMAL HIGH (ref 70–99)

## 2018-01-15 LAB — BASIC METABOLIC PANEL
ANION GAP: 8 (ref 5–15)
BUN: 9 mg/dL (ref 6–20)
CHLORIDE: 108 mmol/L (ref 98–111)
CO2: 23 mmol/L (ref 22–32)
Calcium: 8.5 mg/dL — ABNORMAL LOW (ref 8.9–10.3)
Creatinine, Ser: 1.13 mg/dL (ref 0.61–1.24)
GFR calc Af Amer: >60 mL/min (ref >60)
GLUCOSE: 140 mg/dL — AB (ref 70–99)
POTASSIUM: 5 mmol/L (ref 3.5–5.1)
Sodium: 139 mmol/L (ref 135–145)

## 2018-01-15 LAB — MRSA PCR SCREENING: MRSA by PCR: NEGATIVE

## 2018-01-15 LAB — IRON AND TIBC
IRON: 25 ug/dL — AB (ref 45–182)
Saturation Ratios: 8 % — ABNORMAL LOW (ref 17.9–39.5)
TIBC: 319 ug/dL (ref 250–450)
UIBC: 294 ug/dL

## 2018-01-15 LAB — FERRITIN: Ferritin: 71 ng/mL (ref 24–336)

## 2018-01-15 LAB — RESPIRATORY PANEL BY PCR
ADENOVIRUS-RVPPCR: NOT DETECTED
BORDETELLA PERTUSSIS-RVPCR: NOT DETECTED
CHLAMYDOPHILA PNEUMONIAE-RVPPCR: NOT DETECTED
CORONAVIRUS 229E-RVPPCR: NOT DETECTED
CORONAVIRUS HKU1-RVPPCR: NOT DETECTED
CORONAVIRUS NL63-RVPPCR: NOT DETECTED
Coronavirus OC43: NOT DETECTED
Influenza A: NOT DETECTED
Influenza B: NOT DETECTED
MYCOPLASMA PNEUMONIAE-RVPPCR: NOT DETECTED
Metapneumovirus: NOT DETECTED
Parainfluenza Virus 1: NOT DETECTED
Parainfluenza Virus 2: NOT DETECTED
Parainfluenza Virus 3: NOT DETECTED
Parainfluenza Virus 4: NOT DETECTED
Respiratory Syncytial Virus: NOT DETECTED
Rhinovirus / Enterovirus: NOT DETECTED

## 2018-01-15 LAB — LACTIC ACID, PLASMA
LACTIC ACID, VENOUS: 3.8 mmol/L — AB (ref 0.5–1.9)
Lactic Acid, Venous: 3.6 mmol/L (ref 0.5–1.9)
Lactic Acid, Venous: 4.3 mmol/L (ref 0.5–1.9)
Lactic Acid, Venous: 4.2 mmol/L (ref 0.5–1.9)

## 2018-01-15 LAB — FOLATE: FOLATE: 12.6 ng/mL (ref >5.9)

## 2018-01-15 LAB — VITAMIN B12: Vitamin B-12: 248 pg/mL (ref 180–914)

## 2018-01-15 LAB — HEMOGLOBIN AND HEMATOCRIT, BLOOD
HCT: 38.2 % — ABNORMAL LOW (ref 39.0–52.0)
HEMOGLOBIN: 12.2 g/dL — AB (ref 13.0–17.0)

## 2018-01-15 LAB — PHOSPHORUS: Phosphorus: 3.2 mg/dL (ref 2.5–4.6)

## 2018-01-15 LAB — RETICULOCYTES
RBC.: 4.92 MIL/uL (ref 4.22–5.81)
RETIC COUNT ABSOLUTE: 68.9 10*3/uL (ref 19.0–186.0)
Retic Ct Pct: 1.4 % (ref 0.4–3.1)

## 2018-01-15 LAB — MAGNESIUM: MAGNESIUM: 2.1 mg/dL (ref 1.7–2.4)

## 2018-01-15 MED ORDER — SODIUM CHLORIDE 0.9 % IV BOLUS
1000.0000 mL | Freq: Once | INTRAVENOUS | Status: AC
  Administered 2018-01-15: 1000 mL via INTRAVENOUS

## 2018-01-15 MED ORDER — ORAL CARE MOUTH RINSE
15.0000 mL | OROMUCOSAL | Status: DC
  Administered 2018-01-15 – 2018-01-17 (×26): 15 mL via OROMUCOSAL

## 2018-01-15 MED ORDER — LACTATED RINGERS IV BOLUS
1000.0000 mL | Freq: Once | INTRAVENOUS | Status: AC
  Administered 2018-01-15: 1000 mL via INTRAVENOUS

## 2018-01-15 MED ORDER — SODIUM CHLORIDE 0.9 % IV SOLN
750.0000 mg | Freq: Two times a day (BID) | INTRAVENOUS | Status: DC
  Administered 2018-01-15 – 2018-01-19 (×8): 750 mg via INTRAVENOUS
  Filled 2018-01-15 (×10): qty 7.5

## 2018-01-15 MED ORDER — DEXMEDETOMIDINE HCL IN NACL 400 MCG/100ML IV SOLN
0.4000 ug/kg/h | INTRAVENOUS | Status: DC
  Administered 2018-01-15 (×2): 0.4 ug/kg/h via INTRAVENOUS
  Administered 2018-01-16 (×2): 0.6 ug/kg/h via INTRAVENOUS
  Administered 2018-01-16: 0.4 ug/kg/h via INTRAVENOUS
  Administered 2018-01-17: 1 ug/kg/h via INTRAVENOUS
  Administered 2018-01-17: 0.8 ug/kg/h via INTRAVENOUS
  Administered 2018-01-17: 0.6 ug/kg/h via INTRAVENOUS
  Filled 2018-01-15 (×9): qty 100

## 2018-01-15 MED ORDER — PHENYLEPHRINE HCL-NACL 10-0.9 MG/250ML-% IV SOLN
0.0000 ug/min | INTRAVENOUS | Status: DC
  Administered 2018-01-15: 50 ug/min via INTRAVENOUS
  Administered 2018-01-15: 15 ug/min via INTRAVENOUS
  Administered 2018-01-15: 50 ug/min via INTRAVENOUS
  Administered 2018-01-15: 40 ug/min via INTRAVENOUS
  Administered 2018-01-15: 10 ug/min via INTRAVENOUS
  Filled 2018-01-15: qty 250
  Filled 2018-01-15: qty 10
  Filled 2018-01-15 (×2): qty 250

## 2018-01-15 MED ORDER — CHLORHEXIDINE GLUCONATE 0.12% ORAL RINSE (MEDLINE KIT)
15.0000 mL | Freq: Two times a day (BID) | OROMUCOSAL | Status: DC
  Administered 2018-01-15 – 2018-01-19 (×7): 15 mL via OROMUCOSAL

## 2018-01-15 MED ORDER — MIDAZOLAM HCL 2 MG/2ML IJ SOLN
INTRAMUSCULAR | Status: AC
  Administered 2018-01-15: 4 mg
  Filled 2018-01-15: qty 4

## 2018-01-15 NOTE — Procedures (Signed)
Bedside Bronchoscopy Procedure Note Billy Ayala [id number] 12-03-2000  Procedure: Bronchoscopy Indications: Diagnostic evaluation of the airways, Obtain specimens for culture and/or other diagnostic studies and Remove secretions  Procedure Details: ET Tube Size: ET Tube secured at lip (cm): Bite block in place: No In preparation for procedure, Patient hyper-oxygenated with 100 % FiO2 and Saline given via ETT (60 ml) Airway entered and the following bronchi were examined: RUL, RML, RLL, LUL, LLL and Bronchi.   Bronchoscope removed.  , Patient placed back on 40% FiO2 at conclusion of procedure.    Evaluation BP 131/81   Pulse 98   Temp 99 F (37.2 C) (Axillary)   Resp (!) 22   Ht 6\' 1"  (1.854 m)   Wt 233 lb 7.5 oz (105.9 kg)   SpO2 96%   BMI 30.80 kg/m  Breath Sounds:Rhonch O2 sats: stable throughout Patient's Current Condition: stable Specimens:  Sent  Complications: No apparent complications Patient did tolerate procedure well.   Cherylin MylarDoyle, Aaliayah Miao 01/15/2018, 1:34 PM

## 2018-01-15 NOTE — Progress Notes (Signed)
eLink Physician-Brief Progress Note Patient Name: Gabriel CarinaJustice Q Mahajan DOB: January 20, 2001 MRN: [id number]   Date of Service  01/15/2018  HPI/Events of Note  Lactic Acid = 2.9 --> 3.6. Likely d/t seizure. Hgb = 13.7. LVEF = 60-65%.  eICU Interventions  Will order: 1. Bolus with 0.9 NaCl 1 liter IV over 1 hour now.      Intervention Category Major Interventions: Acid-Base disturbance - evaluation and management  Sommer,Steven Eugene 01/15/2018, 3:34 AM

## 2018-01-15 NOTE — ED Notes (Signed)
Pt to be pushed to CT then to ICU.

## 2018-01-15 NOTE — Progress Notes (Signed)
eLink Physician-Brief Progress Note Patient Name: Billy Ayala DOB: [birth year] MRN: [id number]   Date of Service  01/15/2018  HPI/Events of Note  Lactic Adid = 3.8 --> 4.3. Hgb = 12.2 and last LVEF = 60% to 65%. No CVL or CVP.  eICU Interventions  Will order: 1. Bolus with 0.9 NaCl 1 liter IV over 1 hour now.      Intervention Category Major Interventions: Acid-Base disturbance - evaluation and management  Sommer,Steven Eugene 01/15/2018, 7:49 PM

## 2018-01-15 NOTE — Progress Notes (Addendum)
.. ..  Name: CHENEY EWART MRN: [id number] DOB: [birth year]    ADMISSION DATE:  01/14/2018 CONSULTATION DATE:  01/14/18  REFERRING MD :  EDP  CHIEF COMPLAINT:  Seizure and hemoptysis   HISTORY OF PRESENT ILLNESS:  (History obtained from EMR, patient's family including mother and grandmother and the account of other providers)  17 yr old male with PMHx significant for Sickle cell trait and Autism presents after found having a seizure that lasted 1 minute. He was confused post event for a total of 15 mins.  Mother reports compliance with his Keppra and states that they had a Neuro appointment for August (which was the first available); no planned follow-up with Pulmonology.  It was not until he arrived to the ED that he began having hemoptysis and SOB, requiring intubation.   SUBJECTIVE:  Total ~30 ml of suctioned blood since ER, not significant No other bleeding noted Remains on low dose neo for BP support On wakeup assessment this am, f/c- near baseline mental status  VITAL SIGNS: Temp:  [98.3 F (36.8 C)-99.9 F (37.7 C)] 98.9 F (37.2 C) (07/15 0743) Pulse Rate:  [67-139] 68 (07/15 0800) Resp:  [0-55] 16 (07/15 0800) BP: (75-167)/(38-97) 105/54 (07/15 0800) SpO2:  [85 %-100 %] 99 % (07/15 0800) Arterial Line BP: (80-120)/(47-64) 114/59 (07/15 0800) FiO2 (%):  [40 %-100 %] 40 % (07/15 0747) Weight:  [220 lb (99.8 kg)-233 lb 7.5 oz (105.9 kg)] 233 lb 7.5 oz (105.9 kg) (07/15 0405)  PHYSICAL EXAMINATION: General:  Critically ill young adult AA male on MV in NAD HEENT: MM pink/moist, pupils 4/reactive, anicteric, ETT/ OGT Neuro: Wakes up to verbal- mildly anxious, f/c, MAE CV:  rrr, no m/r/g PULM: even/non-labored on MV, lungs bilaterally clear- sxn without bloody secretions GI: soft, non-tender, bs active  Extremities: warm/dry, no edema  Skin: no rashes    Recent Labs  Lab 01/14/18 1925 01/15/18 0213  NA 138 139  K 4.0 5.0  CL 103 108  CO2 26 23  BUN 9 9    CREATININE 1.08 1.13  GLUCOSE 111* 140*   Recent Labs  Lab 01/14/18 1925 01/15/18 0213  HGB 15.0 13.7  HCT 47.1 42.1  WBC 9.2 21.4*  PLT 292 262   Ct Angio Chest Pe W Or Wo Contrast  Result Date: 01/14/2018 CLINICAL DATA:  17 year old male with seizure and hemoptysis. EXAM: CT ANGIOGRAPHY CHEST WITH CONTRAST TECHNIQUE: Multidetector CT imaging of the chest was performed using the standard protocol during bolus administration of intravenous contrast. Multiplanar CT image reconstructions and MIPs were obtained to evaluate the vascular anatomy. CONTRAST:  70m ISOVUE-370 IOPAMIDOL (ISOVUE-370) INJECTION 76% COMPARISON:  Chest radiograph dated 01/14/2018 FINDINGS: Cardiovascular: There is no cardiomegaly or pericardial effusion. The thoracic aorta is unremarkable. The origins of the great vessels of the aortic arch appear patent. No CT evidence of pulmonary embolism. Mediastinum/Nodes: There is no hilar or mediastinal adenopathy. No mediastinal fluid collection or hematoma. Anterior mediastinal soft tissue likely residual thymic gland. Lungs/Pleura: An endotracheal tube is noted with tip approximately 3 cm above the carina. The central airways are patent. There are large areas of consolidative changes in the right upper lobe and right lower lobe as well as diffuse bilateral confluent ground-glass airspace opacities relatively similar to prior CT of 12/12/2017. Findings may represent aspiration and ARDS. Clinical correlation is recommended. Upper Abdomen: Enteric tube within the esophagus extends into the stomach. The tube makes a single loop in the gastric fundus and extends distally into  the body of the stomach with tip beyond the inferior margin of the image but likely in the mid body of the stomach. Musculoskeletal: No chest wall abnormality. No acute or significant osseous findings. Review of the MIP images confirms the above findings. IMPRESSION: 1. Bilateral airspace consolidation and confluent  ground-glass densities relatively similar to the CT of 12/12/2017. Findings likely represent aspiration and ARDS given provided history of seizure. Clinical correlation is recommended. 2. No large pulmonary embolus. 3. Endotracheal tube above the carina and enteric tube in the stomach. Electronically Signed   By: Anner Crete M.D.   On: 01/14/2018 23:18   Dg Chest Portable 1 View  Result Date: 01/14/2018 CLINICAL DATA:  Pneumonia. Evaluate endotracheal and nasogastric tube placements. EXAM: PORTABLE CHEST 1 VIEW COMPARISON:  Earlier today at 1915 hours. FINDINGS: 2146 hours. Endotracheal tube terminates 3.7 cm cm above carina.Nasogastric tube is looped in the stomach with the side port extending below the inferior aspect of the film. Normal heart size. Mild right hemidiaphragm elevation. No pleural effusion or pneumothorax. Right greater than left airspace disease may be slightly improved on the left. Relative sparing of the subpleural spaces. IMPRESSION: Appropriate position of endotracheal tube. Nasogastric tube extends beyond the inferior aspect of the film. Multifocal airspace disease, with possible improvement on the left. Electronically Signed   By: Abigail Miyamoto M.D.   On: 01/14/2018 22:09   Dg Chest Port 1 View  Result Date: 01/14/2018 CLINICAL DATA:  Cough, dyspnea and seizure.  Hemoptysis. EXAM: PORTABLE CHEST 1 VIEW COMPARISON:  12/16/2017 FINDINGS: The heart size and mediastinal contours are within normal limits. Multilobar airspace opacities throughout both lungs consistent with multilobar pneumonia possibly related to aspiration given history of seizure. Given history of hemoptysis, pulmonary hemorrhage or stigmata of ARDS might also account for some of the opacities seen. Stigmata of pulmonary edema is within the differential as well. The visualized skeletal structures are unremarkable. IMPRESSION: Multilobar airspace disease compatible with multilobar pneumonia possibly aspiration related  given history of seizure. Other possibilities may include pulmonary hemorrhage, ARDS and/or pulmonary edema. Electronically Signed   By: Ashley Royalty M.D.   On: 01/14/2018 19:48    SIGNIFICANT EVENTS  7/14 Admit - Seizure/ SOB/ Hemoptysis/ intubated  STUDIES:  7/14 CTA PE >> 1. Bilateral airspace consolidation and confluent ground-glass densities relatively similar to the CT of 12/12/2017. Findings likely represent aspiration and ARDS given provided history of seizure. Clinical correlation is recommended. 2. No large pulmonary embolus. 3. Endotracheal tube above the carina and enteric tube in the Stomach.  CULTURES:  7/14 BCx2  >> 7/14 MRSA PCR >> neg   7/15 RVP >> 7/15 trach asp >>  Antibiotics:  7/14 zosyn >>  BRIEF PATIENT DESCRIPTION: 17 yr old male with PMHx significant for Sickle cell trait and Autism presents after found having a seizure that lasted 1 minute. He was confused post event and on presentation to the ED began having hemoptysis and SOB. PCCM consulted.  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, prev bronch in June with serial bloody aliquots; rapid improvement after steroids/ tx for aspiration; no etiology determined, autoimmune neg in June - CTA PE neg for PE; bilateral infiltrates/ GGO P:  Continue PRVC -currently on 7cc/kg (651m), rate 18 -repeat ABG noted, will increase TV to 8cc/kg, peep down to 8 and repeat ABG in  1 hour Wean PEEP / FiO2 for sats >92% CXR in am CTA- R/O PE neg Continue high dose steroids in the setting of suspected hemorrhage, solumedrol [id number] Will discuss with attending r/t bronchoscopy  Pending repeat autoimmune panel VAP bundle Pepcid for SUP See ID  ID: R/o Sepsis Send RVP Following Blood cx; add sputum cx  UA neg Continue empiric zosyn for now- PCT neg, repeat in AM, if neg AND remains afebrile, consider d/c zosyn   Endocrine: No h/o insulin dependence TSH -0.422 Continue CBG q 4 May need SSI secondary to high dose steroids  GI: No acute issues  P: NPO/ OGT Start TF in am if remains intubated Bowel regimen for PAD protocol   Heme: R/o Diffuse Alveolar Hemorrhage - Noted family h/o scleroderma - hemoglobin 15 -> 13.7 - normal coags  P:  Repeat H/H at 1600 CBC in am Check anemia panel given - low MCV Repeat autoimmune panel pending; neg CRP/ ESR Hgb<7 transfuse PRBCs Monitor bleeding DVT PPx-> SCDs and Lovenox [place]  RENAL Lactic acidosis  P:  UA neg for protein NS at 75 ml/hr Continue foley  Trend BMP / mag/ phos/ daily wt/ urinary output Replace electrolytes as indicated Avoid nephrotoxic agents, ensure adequate renal perfusion   DISPOSITION: ICU CC TIME: 75 mins CODE STATUS: full PROGNOSIS: Guarded FAMILY:  Mother updated at bedside 7/15  CCT: 4529ins  BrKennieth RadAGACNP-BC LeMatthewsgr: [phone] if no answer [phone]/15/2019, 9:44 AM

## 2018-01-15 NOTE — Procedures (Signed)
Intubation Procedure Note JENARO SOUDER [id number] 02-07-2001  Procedure: Intubation Indications: Respiratory insufficiency and Airway protection  Procedure Details Consent: Risks of procedure as well as the alternatives and risks of each were explained to the (patient/caregiver).  Consent for procedure obtained.-> explained in detail to mother and grandmother. Time Out: Verified patient identification, verified procedure, site/side was marked, verified correct patient position, special equipment/implants available, medications/allergies/relevent history reviewed, required imaging and test results available.  Performed  Maximum sterile technique was used including gloves, hand hygiene and mask.  MAC and 3    Evaluation Hemodynamic Status: BP stable throughout; O2 sats: transiently fell during during procedure Patient's Current Condition: stable Complications: No apparent complications Patient did tolerate procedure well. Chest X-ray ordered to verify placement.  CXR: tube position acceptable.   Rise Paganini Breasia Karges 01/15/2018

## 2018-01-15 NOTE — Progress Notes (Signed)
Per [place], MD keep patient on IV Propofol at and Fentanyl at over night.

## 2018-01-15 NOTE — Progress Notes (Signed)
CRITICAL VALUE ALERT  Critical Value:  Lactic acid 4.2  Date & Time Notied:  01/15/18 10:53 PM  Provider Notified: elink  Orders Received/Actions taken: see MAR

## 2018-01-15 NOTE — Progress Notes (Signed)
CRITICAL VALUE ALERT  Critical Value:  Lactic acid 4.3  Date & Time Notied:  01/15/18 7:14 PM  Provider Notified: elink  Orders Received/Actions taken: see MAR

## 2018-01-15 NOTE — Progress Notes (Signed)
CRITICAL VALUE ALERT  Critical Value:  Lactic acid 3.6  Date & Time Notied:  01/15/18  0255  Provider Notified: Pola CornELink   Orders Received/Actions taken:

## 2018-01-15 NOTE — Procedures (Signed)
Arterial Catheter Insertion Procedure Note Gabriel CarinaJustice Q Tarquinio [id number] 2001/03/27  Procedure: Insertion of Arterial Catheter  Indications: Blood pressure monitoring  Procedure Details Consent: Unable to obtain consent because of altered level of consciousness. Time Out: Verified patient identification, verified procedure, site/side was marked, verified correct patient position, special equipment/implants available, medications/allergies/relevent history reviewed, required imaging and test results available.  Performed  Maximum sterile technique was used including antiseptics, cap, gloves, gown, hand hygiene, mask and sheet. Skin prep: Chlorhexidine; local anesthetic administered 20 gauge catheter was inserted into left radial artery using the Seldinger technique. ULTRASOUND GUIDANCE USED: NO Evaluation Blood flow good; BP tracing good. Complications: No apparent complications.   Donato Schultzllis, Coralee Edberg [facility] 01/15/2018

## 2018-01-15 NOTE — Progress Notes (Signed)
eLink Physician-Brief Progress Note Patient Name: Billy Ayala DOB: 08-18-2000 MRN: [id number]   Date of Service  01/15/2018  HPI/Events of Note  Lactic Acid = 4.3 --> 4.2 post 0.9 NaCl bolus.   eICU Interventions  Will order: 1. Bolus with 0.9 NaCl 1 liter IV over 1 hour now.  2. Continue to trend Lactic Acid.      Intervention Category Major Interventions: Acid-Base disturbance - evaluation and management  Sommer,Steven Eugene 01/15/2018, 10:59 PM

## 2018-01-15 NOTE — Procedures (Signed)
Video Bronchoscopy Procedure Note  Date of Operation: 01/15/2018  Pre-op Diagnosis: Hemoptysis  Post-op Diagnosis: Same  Surgeon: Baltazar Apo  Assistants: none  Anesthesia: conscious sedation, moderate sedation  Meds Given: fentanyl 100 Mcg +400 mcg/h infusion, versed 5 mg in divided doses, Precedex infusion increased to 1.2  Operation: Flexible video fiberoptic bronchoscopy and biopsies.  Estimated Blood Loss: None  Complications: none noted  Indications and History: Billy Ayala is 17 y.o. with history of poorly understood bilateral diffuse alveolar infiltrates associated with hemoptysis.  He has been treated with corticosteroids with some resolution approximate 1 month ago.  Off immunosuppression his infiltrates and his hemoptysis have returned.  Recommendation was to perform video fiberoptic bronchoscopy. The risks, benefits, complications, treatment options and expected outcomes were discussed with the patient.  The possibilities of pneumothorax, pneumonia, reaction to medication, pulmonary aspiration, perforation of a viscus, bleeding, failure to diagnose a condition and creating a complication requiring transfusion or operation were discussed with the patient who freely signed the consent.    Description of Procedure: The patient was seen in the Preoperative Area, was examined and was deemed appropriate to proceed.  The patient was identified as [name] and the procedure verified as Flexible Video Fiberoptic Bronchoscopy.  A Time Out was held and the above information confirmed.   Conscious sedation was initiated as indicated above. The video fiberoptic bronchoscope was introduced via the endotracheal tube and a general inspection was performed which showed blood in all airways without any clear active source.  Normal trachea, normal main carina. The R sided airways were inspected and showed normal RUL, BI, RML and RLL with the exception of the bleeding.  There were no  endobronchial lesions. The L side was then inspected. The LLL, Lingular and LUL airways were normal with the exception of bleeding from every lobe, every segment..   Bronchoalveolar lavage was performed in the anterior segment of the right upper lobe with 60 cc of normal saline instilled and approximately 20 cc of bloody return.  This will be sent for hematocrit, microbiology, cytology.  The patient tolerated the procedure well. The bronchoscope was removed. There were no obvious complications.   Samples: 1.  BAL from the anterior segment of the right upper lobe  Plans:  We will review the cytology, microbiology results with the patient and family when they become available.   Baltazar Apo, MD, PhD 01/15/2018, 1:33 PM [facility] [phone] or if no answer [phone]

## 2018-01-15 NOTE — Progress Notes (Signed)
  Social Note  Came by to see Billy Ayala. Unfortunately he has had a second episode of presumed seizure with more hemoptysis. Prior to this, family was questioning the seizure diagnosis after last hospital stay. Hard to say at this point if seizures are main issue or there is an underlying lung problem that is causing hypoxia and then seizures. Discussed this with mother at bedside. Hopeful for answers. Appreciate excellent care by CCM. Will assume care once he is transitioned out of the ICU.  Dolores PattyAngela Tela Kotecki, DO PGY-2, [facility] 01/15/2018 7:51 AM  FAMILY MEDICINE TEACHING SERVICE Patient  - Please contact intern pager [phone] (via phone or AMION, login: [email or web address]) for questions regarding care. Text pages welcome. Please DO NOT page attending.

## 2018-01-16 ENCOUNTER — Inpatient Hospital Stay (HOSPITAL_COMMUNITY): Payer: Medicaid Other

## 2018-01-16 ENCOUNTER — Encounter (HOSPITAL_COMMUNITY): Payer: Self-pay | Admitting: *Deleted

## 2018-01-16 LAB — BLOOD GAS, ARTERIAL
ACID-BASE DEFICIT: 2.4 mmol/L — AB (ref 0.0–2.0)
BICARBONATE: 21.3 mmol/L (ref 20.0–28.0)
Drawn by: 347191
FIO2: 40
LHR: 22 {breaths}/min
MECHVT: 640 mL
O2 Saturation: 95.4 %
PEEP/CPAP: 8 cmH2O
Patient temperature: 99.6
pCO2 arterial: 34.3 mmHg (ref 32.0–48.0)
pH, Arterial: 7.413 (ref 7.350–7.450)
pO2, Arterial: 80.1 mmHg — ABNORMAL LOW (ref 83.0–108.0)

## 2018-01-16 LAB — LACTIC ACID, PLASMA
LACTIC ACID, VENOUS: 4.3 mmol/L — AB (ref 0.5–1.9)
LACTIC ACID, VENOUS: 2.9 mmol/L — AB (ref 0.5–1.9)
Lactic Acid, Venous: 3.7 mmol/L (ref 0.5–1.9)

## 2018-01-16 LAB — GLUCOSE, CAPILLARY
GLUCOSE-CAPILLARY: 162 mg/dL — AB (ref 70–99)
GLUCOSE-CAPILLARY: 176 mg/dL — AB (ref 70–99)
GLUCOSE-CAPILLARY: 181 mg/dL — AB (ref 70–99)
GLUCOSE-CAPILLARY: 222 mg/dL — AB (ref 70–99)
Glucose-Capillary: 180 mg/dL — ABNORMAL HIGH (ref 70–99)
Glucose-Capillary: 163 mg/dL — ABNORMAL HIGH (ref 70–99)

## 2018-01-16 LAB — MICROALBUMIN / CREATININE URINE RATIO
Creatinine, Urine: 62.1 mg/dL
Microalb Creat Ratio: 93.6 mg/g creat — ABNORMAL HIGH (ref 0.0–30.0)
Microalb, Ur: 58.1 ug/mL — ABNORMAL HIGH

## 2018-01-16 LAB — RENAL FUNCTION PANEL
ANION GAP: 10 (ref 5–15)
Albumin: 3 g/dL — ABNORMAL LOW (ref 3.5–5.0)
BUN: 16 mg/dL (ref 6–20)
CALCIUM: 8.7 mg/dL — AB (ref 8.9–10.3)
CHLORIDE: 112 mmol/L — AB (ref 98–111)
CO2: 21 mmol/L — AB (ref 22–32)
CREATININE: 1.03 mg/dL (ref 0.61–1.24)
GFR calc Af Amer: >60 mL/min (ref >60)
GFR calc non Af Amer: >60 mL/min (ref >60)
GLUCOSE: 180 mg/dL — AB (ref 70–99)
Phosphorus: 2.6 mg/dL (ref 2.5–4.6)
Potassium: 4 mmol/L (ref 3.5–5.1)
Sodium: 143 mmol/L (ref 135–145)

## 2018-01-16 LAB — ANTI-DNA ANTIBODY, DOUBLE-STRANDED

## 2018-01-16 LAB — MAGNESIUM
MAGNESIUM: 2.1 mg/dL (ref 1.7–2.4)
Magnesium: 1.9 mg/dL (ref 1.7–2.4)
Magnesium: 2.1 mg/dL (ref 1.7–2.4)

## 2018-01-16 LAB — HEPATIC FUNCTION PANEL
ALBUMIN: 2.9 g/dL — AB (ref 3.5–5.0)
ALK PHOS: 63 U/L (ref 38–126)
ALT: 25 U/L (ref 0–44)
AST: 23 U/L (ref 15–41)
BILIRUBIN TOTAL: 1.1 mg/dL (ref 0.3–1.2)
Bilirubin, Direct: 0.2 mg/dL (ref 0.0–0.2)
Indirect Bilirubin: 0.9 mg/dL (ref 0.3–0.9)
Total Protein: 6.2 g/dL — ABNORMAL LOW (ref 6.5–8.1)

## 2018-01-16 LAB — CBC
HCT: 37.4 % — ABNORMAL LOW (ref 39.0–52.0)
Hemoglobin: 12.1 g/dL — ABNORMAL LOW (ref 13.0–17.0)
MCH: 24.7 pg — ABNORMAL LOW (ref 26.0–34.0)
MCHC: 32.4 g/dL (ref 30.0–36.0)
MCV: 76.5 fL — ABNORMAL LOW (ref 78.0–100.0)
PLATELETS: 243 10*3/uL (ref 150–400)
RBC: 4.89 MIL/uL (ref 4.22–5.81)
RDW: 15.6 % — ABNORMAL HIGH (ref 11.5–15.5)
WBC: 15.5 10*3/uL — AB (ref 4.0–10.5)

## 2018-01-16 LAB — PHOSPHORUS
Phosphorus: 4.6 mg/dL (ref 2.5–4.6)
Phosphorus: 3.7 mg/dL (ref 2.5–4.6)

## 2018-01-16 LAB — C4 COMPLEMENT: COMPLEMENT C4, BODY FLUID: 26 mg/dL (ref 14–44)

## 2018-01-16 LAB — C3 COMPLEMENT: C3 COMPLEMENT: 145 mg/dL (ref 82–167)

## 2018-01-16 LAB — ANA: Anti Nuclear Antibody(ANA): NEGATIVE

## 2018-01-16 LAB — CYCLIC CITRUL PEPTIDE ANTIBODY, IGG/IGA: CCP Antibodies IgG/IgA: 12 units (ref 0–19)

## 2018-01-16 LAB — LEVETIRACETAM LEVEL: LEVETIRACETAM: 3.8 ug/mL — AB (ref 10.0–40.0)

## 2018-01-16 LAB — ANCA TITERS
C-ANCA: <1:20 {titer}
P-ANCA: <1:20 {titer}

## 2018-01-16 LAB — ANTI-SCLERODERMA ANTIBODY: Scleroderma (Scl-70) (ENA) Antibody, IgG: <0.2 AI (ref 0.0–0.9)

## 2018-01-16 LAB — MPO/PR-3 (ANCA) ANTIBODIES: ANCA Proteinase 3: <3.5 U/mL (ref 0.0–3.5)

## 2018-01-16 LAB — PROCALCITONIN: PROCALCITONIN: 3.51 ng/mL

## 2018-01-16 MED ORDER — VITAL AF 1.2 CAL PO LIQD
1500.0000 mL | ORAL | Status: DC
  Administered 2018-01-16: 1500 mL
  Filled 2018-01-16 (×3): qty 1500

## 2018-01-16 MED ORDER — FUROSEMIDE 10 MG/ML IJ SOLN
20.0000 mg | Freq: Once | INTRAMUSCULAR | Status: AC
  Administered 2018-01-16: 20 mg via INTRAVENOUS
  Filled 2018-01-16: qty 2

## 2018-01-16 MED ORDER — BISACODYL 10 MG RE SUPP: 10.0000 mg | Freq: Every day | RECTAL | Status: DC | PRN

## 2018-01-16 MED ORDER — SODIUM CHLORIDE 0.9 % IV BOLUS
1000.0000 mL | Freq: Once | INTRAVENOUS | Status: AC
  Administered 2018-01-16: 1000 mL via INTRAVENOUS

## 2018-01-16 MED ORDER — VITAL HIGH PROTEIN PO LIQD
1000.0000 mL | ORAL | Status: DC
  Administered 2018-01-16: 1000 mL

## 2018-01-16 MED ORDER — PRO-STAT SUGAR FREE PO LIQD
30.0000 mL | Freq: Two times a day (BID) | ORAL | Status: DC
  Administered 2018-01-16: 30 mL
  Filled 2018-01-16: qty 30

## 2018-01-16 MED ORDER — DOCUSATE SODIUM 50 MG/5ML PO LIQD
100.0000 mg | Freq: Two times a day (BID) | ORAL | Status: DC
  Administered 2018-01-16 – 2018-01-18 (×4): 100 mg
  Filled 2018-01-16 (×4): qty 10

## 2018-01-16 MED ORDER — FAMOTIDINE IN NACL 20-0.9 MG/50ML-% IV SOLN
20.0000 mg | INTRAVENOUS | Status: DC
  Administered 2018-01-16 – 2018-01-17 (×2): 20 mg via INTRAVENOUS
  Filled 2018-01-16 (×2): qty 50

## 2018-01-16 MED ORDER — HYDRALAZINE HCL 20 MG/ML IJ SOLN
10.0000 mg | Freq: Four times a day (QID) | INTRAMUSCULAR | Status: DC | PRN
  Administered 2018-01-16 – 2018-01-17 (×3): 10 mg via INTRAVENOUS
  Filled 2018-01-16 (×3): qty 1

## 2018-01-16 MED ORDER — LACTATED RINGERS IV SOLN
INTRAVENOUS | Status: DC
  Administered 2018-01-16 (×2): via INTRAVENOUS

## 2018-01-16 MED ORDER — INSULIN ASPART 100 UNIT/ML ~~LOC~~ SOLN
1.0000 [IU] | SUBCUTANEOUS | Status: DC
  Administered 2018-01-16 (×3): 2 [IU] via SUBCUTANEOUS
  Administered 2018-01-16: 3 [IU] via SUBCUTANEOUS
  Administered 2018-01-16: 2 [IU] via SUBCUTANEOUS
  Administered 2018-01-17: 3 [IU] via SUBCUTANEOUS
  Administered 2018-01-17: 2 [IU] via SUBCUTANEOUS

## 2018-01-16 NOTE — Progress Notes (Addendum)
.. ..  Name: Billy Ayala MRN: [id number] DOB: Oct 31, 2000    ADMISSION DATE:  01/14/2018 CONSULTATION DATE:  01/14/18  REFERRING MD :  EDP  CHIEF COMPLAINT:  Seizure and hemoptysis   HISTORY OF PRESENT ILLNESS:  (History obtained from EMR, patient's family including mother and grandmother and the account of other providers)  17 yr old male with PMHx significant for Sickle cell trait and Autism presents after found having a seizure that lasted 1 minute. He was confused post event for a total of 15 mins.  Mother reports compliance with his Keppra and states that they had a Neuro appointment for August (which was the first available); no planned follow-up with Pulmonology.  It was not until he arrived to the ED that he began having hemoptysis and SOB, requiring intubation.   SUBJECTIVE:  Increasing Lactic overnight treated with 3L NS, last lactic 3.7 Hemodynamically stable, mildly hypertensive Total suctioned secretions ~100 over last 24 hours, remains bloody Remains on precedex 0.6 and fentanyl gtt at 125 mcg/hr, requiring restraints as he frequently reaches for ETT, but otherwise has been able to f/c, no neurological events overnight Remains on PEEP 8/ 40%  VITAL SIGNS: Temp:  [98.9 F (37.2 C)-99.6 F (37.6 C)] 99.5 F (37.5 C) (07/16 0712) Pulse Rate:  [69-125] 89 (07/16 0712) Resp:  [0-23] 20 (07/16 0712) BP: (99-159)/(58-120) 150/120 (07/16 0712) SpO2:  [93 %-100 %] 98 % (07/16 0712) Arterial Line BP: (110-166)/(57-98) 166/98 (07/16 0700) FiO2 (%):  [40 %] 40 % (07/16 0712) Weight:  [240 lb 11.9 oz (109.2 kg)] 240 lb 11.9 oz (109.2 kg) (07/16 0500)  PHYSICAL EXAMINATION: General:  Critically ill young AA male on MV sedated, NAD HEENT: MM pink/moist, pupils 3/reactive, anicteric, ETT/ OGT  Neuro: sedated, opens eyes to voice, f/c in all extremities  CV: RR IR no m/r/g PULM: even/non-labored on MV, currently on PSV 10/8, lungs bilaterally coarse throughout, minimal  secretions GI: soft, non-tender, hypo BS  Extremities: warm/dry, trace edema Skin: no rashes    Recent Labs  Lab 01/14/18 1925 01/15/18 0213 01/16/18 0516  NA 138 139 143  K 4.0 5.0 4.0  CL 103 108 112*  CO2 26 23 21*  BUN _0 CREATININE 1.08 1.13 1.03  GLUCOSE 111* 140* 180*   Recent Labs  Lab 01/14/18 1925 01/15/18 0213 01/15/18 1223 01/16/18 0516  HGB 15.0 13.7 12.2* 12.1*  HCT 47.1 42.1 38.2* 37.4*  WBC 9.2 21.4*  --  15.5*  PLT 292 262  --  243   Ct Angio Chest Pe W Or Wo Contrast  Result Date: 01/14/2018 CLINICAL DATA:  17 year old male with seizure and hemoptysis. EXAM: CT ANGIOGRAPHY CHEST WITH CONTRAST TECHNIQUE: Multidetector CT imaging of the chest was performed using the standard protocol during bolus administration of intravenous contrast. Multiplanar CT image reconstructions and MIPs were obtained to evaluate the vascular anatomy. CONTRAST:  31m ISOVUE-370 IOPAMIDOL (ISOVUE-370) INJECTION 76% COMPARISON:  Chest radiograph dated 01/14/2018 FINDINGS: Cardiovascular: There is no cardiomegaly or pericardial effusion. The thoracic aorta is unremarkable. The origins of the great vessels of the aortic arch appear patent. No CT evidence of pulmonary embolism. Mediastinum/Nodes: There is no hilar or mediastinal adenopathy. No mediastinal fluid collection or hematoma. Anterior mediastinal soft tissue likely residual thymic gland. Lungs/Pleura: An endotracheal tube is noted with tip approximately 3 cm above the carina. The central airways are patent. There are large areas of consolidative changes in the right upper lobe and right lower lobe as  well as diffuse bilateral confluent ground-glass airspace opacities relatively similar to prior CT of 12/12/2017. Findings may represent aspiration and ARDS. Clinical correlation is recommended. Upper Abdomen: Enteric tube within the esophagus extends into the stomach. The tube makes a single loop in the gastric fundus and extends  distally into the body of the stomach with tip beyond the inferior margin of the image but likely in the mid body of the stomach. Musculoskeletal: No chest wall abnormality. No acute or significant osseous findings. Review of the MIP images confirms the above findings. IMPRESSION: 1. Bilateral airspace consolidation and confluent ground-glass densities relatively similar to the CT of 12/12/2017. Findings likely represent aspiration and ARDS given provided history of seizure. Clinical correlation is recommended. 2. No large pulmonary embolus. 3. Endotracheal tube above the carina and enteric tube in the stomach. Electronically Signed   By: Anner Crete M.D.   On: 01/14/2018 23:18   Dg Chest Port 1 View  Result Date: 01/16/2018 CLINICAL DATA:  17 year old male status post seizure.  Hemoptysis. EXAM: PORTABLE CHEST 1 VIEW COMPARISON:  Chest CTA 01/14/2018 and earlier. FINDINGS: Portable AP semi upright view at 0420 hours. Stable endotracheal tube, tip at the level the clavicles. Enteric tube loops in the proximal stomach and continues distally, tip not included. Stable lung volumes. Mediastinal contours remain within normal limits. Confluent bilateral basilar predominant pulmonary opacity seen to reflect a combination of consolidation and centrilobular ground-glass on 01/14/2018 appears regressed. Confluent residual in both lower lungs. No superimposed pneumothorax. No pleural effusion is evident. Paucity of bowel gas in the upper abdomen. No acute osseous abnormality identified. IMPRESSION: 1.  Stable lines and tubes. 2. Improved bilateral ventilation with regression of confluent bilateral pulmonary opacity since 01/14/2018, compatible with improving pneumonia or ARDS. Electronically Signed   By: Genevie Ann M.D.   On: 01/16/2018 06:21   Dg Chest Portable 1 View  Result Date: 01/14/2018 CLINICAL DATA:  Pneumonia. Evaluate endotracheal and nasogastric tube placements. EXAM: PORTABLE CHEST 1 VIEW COMPARISON:   Earlier today at 1915 hours. FINDINGS: 2146 hours. Endotracheal tube terminates 3.7 cm cm above carina.Nasogastric tube is looped in the stomach with the side port extending below the inferior aspect of the film. Normal heart size. Mild right hemidiaphragm elevation. No pleural effusion or pneumothorax. Right greater than left airspace disease may be slightly improved on the left. Relative sparing of the subpleural spaces. IMPRESSION: Appropriate position of endotracheal tube. Nasogastric tube extends beyond the inferior aspect of the film. Multifocal airspace disease, with possible improvement on the left. Electronically Signed   By: Abigail Miyamoto M.D.   On: 01/14/2018 22:09   Dg Chest Port 1 View  Result Date: 01/14/2018 CLINICAL DATA:  Cough, dyspnea and seizure.  Hemoptysis. EXAM: PORTABLE CHEST 1 VIEW COMPARISON:  12/16/2017 FINDINGS: The heart size and mediastinal contours are within normal limits. Multilobar airspace opacities throughout both lungs consistent with multilobar pneumonia possibly related to aspiration given history of seizure. Given history of hemoptysis, pulmonary hemorrhage or stigmata of ARDS might also account for some of the opacities seen. Stigmata of pulmonary edema is within the differential as well. The visualized skeletal structures are unremarkable. IMPRESSION: Multilobar airspace disease compatible with multilobar pneumonia possibly aspiration related given history of seizure. Other possibilities may include pulmonary hemorrhage, ARDS and/or pulmonary edema. Electronically Signed   By: Ashley Royalty M.D.   On: 01/14/2018 19:48    SIGNIFICANT EVENTS  7/14 Admit - Seizure/ SOB/ Hemoptysis/ intubated  STUDIES:  7/14 CTA PE >>  1. Bilateral airspace consolidation and confluent ground-glass densities relatively similar to the CT of 12/12/2017. Findings likely represent aspiration and ARDS given provided history of seizure. Clinical correlation is recommended. 2. No large  pulmonary embolus. 3. Endotracheal tube above the carina and enteric tube in the Stomach.  CULTURES:  7/14 BCx2  >>  7/14 MRSA PCR >> neg   7/15 RVP >> negative  7/15 trach asp >> 7/15 BAL >> 7/15 BAL CMV >> 7/15 BAL viral  >> 7/15 BAL cytology >  Antibiotics:  7/14 zosyn >>  BRIEF PATIENT DESCRIPTION: 17 yr old male with PMHx significant for Sickle cell trait and Autism presents after found having a seizure that lasted 1 minute. He was confused post event and on presentation to the ED began having hemoptysis and SOB, intubated for airway protection.  ASSESSMENT / PLAN:  PULMONARY: Acute Hypoxic Respiratory Failure Hemoptysis-  Concern for DAH vs vasculitis/ autoimmune process, prev bronch in June with serial bloody aliquots; rapid improvement after steroids/ tx for aspiration; no etiology determined, autoimmune neg in June - CTA PE neg for PE; bilateral infiltrates/ GGO - 7/16 CXR with stable ETT/OGT, improving bilateral opacities  P:  Continue PRVC - 8cc/kg, rate 22 ABG review- no changes  Daily SBT, will discuss with attending possibility of extubation today Intermittent CXR VAP bundle Pepcid for SUP  Continue solumedrol 196m q 6 for now See ID   Pending autoimmune panel C3/ C4 > normal ESR/ CRP  >  normal ANA >> ANCA titers>> ANCA antibodies >> Anti-phos lipid >> DS DNA >. CCP antibody >  sSa antibody >  NEURO: Seizure disorder - dx 12/2017 Hx Autism  - CTH and MRI 12/2017 normal  - spoke with neurology 7/15 with recs to increase keppra dose and if no further events, follow-up as outpatient as scheduled in August P: Continue Keppra 750 mg BID Ongoing neuro checks  Seizure precautions PAD protocol for RASS goal: 0/ -1  Attempt to minimize fentanyl , continue precedex gtt Daily wake up assessments if further seizure activity noted/suspected, will need to formally consult neurology   CARDIAC: Hypotension- resolved   - TTE 12/2017 - normal; PAP 37 P:    Tele monitoring Off neo MAP goal > 65 Apresoline PRN SBP > 180  ID: R/o Sepsis - PCT initially negative, today elevated at 3.51- unclear the significance given that patient remains afebrile, improving WBC, and improving CXR P:  RVP negative Following cultures - blood, sputum, BAL Continue  Zosyn, given MRSA PCR neg, we need to add vanc at this point Trend WBC/ fever curve    Endocrine: Hyperglycemia - in the setting of high dose steroids Continue CBG q 4 Add SSI  GI: No acute issues  - last BM prior to admit P: NPO Start TF Bowel regimen for PAD protocol - scheduled colace, prn dulcolax    Heme: Hemoptysis  - Noted family h/o scleroderma - hemoglobin 15 -> 13.7 -> 12.2 ->12.1  - near gram drop daily - will monitor  - iron studies show low iron and saturation ratios, normal TIBC/ ferritin P:  Monitor bleeding CBC in am autoimmune panel pending  Transfuse for Hgb<7  DVT PPx-> SCDs and Lovenox [place]  RENAL Lactic acidosis  Mild hyperchloremic acidosis  - continue elevated lactate despite IVF and patient being mildly hypertensive  - + 8.2 L/ wt up >10 lbs since admit if correct P:  Change from NS to LR at 75 ml/hr Will recheck LFTs to ensure normal  hepatic function but could be type A given significant hemoptysis  Trend BMP / mag/ phos/ daily wt/ urinary output Replace electrolytes as indicated Avoid nephrotoxic agents, ensure adequate renal perfusion   DISPOSITION: ICU CODE STATUS: Full  PROGNOSIS: Guarded FAMILY:  Mother updated at bedside 7/16.  CCT: 45 mins  Kennieth Rad, AGACNP-BC [facility] Pgr: [phone] or if no answer [phone] 01/16/2018, 9:52 AM

## 2018-01-16 NOTE — Progress Notes (Addendum)
CRITICAL VALUE ALERT  Critical Value:  Lactic acid 3.4  Date & Time Notied:  01/16/18 6:27 AM  Provider Notified: Dr. Arsenio LoaderSommer  Orders Received/Actions taken: continue to monitor at this time; trend lactic acids

## 2018-01-16 NOTE — Progress Notes (Signed)
CRITICAL VALUE ALERT  Critical Value:  Lactic acid 4.3  Date & Time Notied:  01/16/18 2:26 AM  Provider Notified: elink  Orders Received/Actions taken: see mar

## 2018-01-16 NOTE — Progress Notes (Signed)
Initial Nutrition Assessment  DOCUMENTATION CODES:   Obesity unspecified  INTERVENTION:    Vital AF 1.2 at 90 ml/h (2160 ml per day)  Provides 2592 kcal, 162 gm protein, 1752 ml free water daily  NUTRITION DIAGNOSIS:   Inadequate oral intake related to inability to eat as evidenced by NPO status.  GOAL:   Provide needs based on ASPEN/SCCM guidelines  MONITOR:   Vent status, TF tolerance, Labs, I & O's  REASON FOR ASSESSMENT:   Ventilator, Consult Enteral/tube feeding initiation and management  ASSESSMENT:   17 yo male with PMH of autism and sickle cell trait who was admitted on 7/14 s/p seizure, hemoptysis. Required intubation in the ED.   Discussed patient with RN today. Patient to remain intubated today. Blood was found in his sputum. Patient is currently intubated on ventilator support MV: 19.6 L/min Temp (24hrs), Avg:99.3 F (37.4 C), Min:98.9 F (37.2 C), Max:99.6 F (37.6 C)   Labs reviewed. Lactic acid 3.7 (H) --> 2.9 (H) CBG's: 180-163 Medications reviewed and include Colace, Solu-medrol. Weight is up ~11% within the past month.   NUTRITION - FOCUSED PHYSICAL EXAM:    Most Recent Value  Orbital Region  No depletion  Upper Arm Region  No depletion  Thoracic and Lumbar Region  Unable to assess  Buccal Region  No depletion  Temple Region  No depletion  Clavicle Bone Region  No depletion  Clavicle and Acromion Bone Region  No depletion  Scapular Bone Region  Unable to assess  Dorsal Hand  Unable to assess  Patellar Region  No depletion  Anterior Thigh Region  No depletion  Posterior Calf Region  No depletion  Edema (RD Assessment)  None  Hair  Reviewed  Eyes  Unable to assess  Mouth  Unable to assess  Skin  Reviewed  Nails  Unable to assess       Diet Order:   Diet Order           Diet NPO time specified  Diet effective now          EDUCATION NEEDS:   No education needs have been identified at this time  Skin:  Skin  Assessment: Reviewed RN Assessment  Last BM:  PTA  Height:   Ht Readings from Last 1 Encounters:  01/14/18 6\' 1"  (1.854 m) (90 %, Z= 1.26)*   * Growth percentiles are based on CDC (Boys, 2-20 Years) data.    Weight:   Wt Readings from Last 1 Encounters:  01/16/18 240 lb 11.9 oz (109.2 kg) (99 %, Z= 2.29)*   * Growth percentiles are based on CDC (Boys, 2-20 Years) data.    Ideal Body Weight:  83.6 kg  BMI:  Body mass index is 31.76 kg/m.  96th percentile on admission = obesity  Estimated Nutritional Needs:   Kcal:  2540   Protein:  165 gm  Fluid:  2.5 L    Joaquin CourtsKimberly Frederick Marro, RD, LDN, CNSC Pager [phone] After Hours Pager [phone]

## 2018-01-16 NOTE — Care Management Note (Signed)
Case Management Note  Patient Details  Name: Gabriel CarinaJustice Q Toelle MRN: [id number] Date of Birth: May 29, 2001  Subjective/Objective:  Form home, 17 yr old male with PMHx significant for Sickle cell trait and Autism presents after found having a seizure that lasted 1 minute. He was confused post event and on presentation to the ED began having hemoptysis and SOB, intubated for airway protection                   Action/Plan: NCM will follow for dc needs.   Expected Discharge Date:                  Expected Discharge Plan:     In-House Referral:     Discharge planning Services  CM Consult  Post Acute Care Choice:    Choice offered to:     DME Arranged:    DME Agency:     HH Arranged:    HH Agency:     Status of Service:  In process, will continue to follow  If discussed at Long Length of Stay Meetings, dates discussed:    Additional Comments:  Leone Havenaylor, Aleanna Menge Clinton, RN 01/16/2018, 5:08 PM

## 2018-01-16 NOTE — Progress Notes (Signed)
  Social Note  Came by to see Billy Ayala. He's intubated but awake and tracks me with his eyes. Mom at bedside, hopeful for answers. Appreciate CCM's excellent care. Will assume care once he is transitioned to the floor.   Dolores PattyAngela Joshuajames Moehring, DO PGY-2, [facility] 01/16/2018 7:33 AM   FAMILY MEDICINE TEACHING SERVICE Patient  - Please contact intern pager [phone] (via phone or AMION, login: [email or web address]) for questions regarding care. Text pages welcome. Please DO NOT page attending.

## 2018-01-16 NOTE — Progress Notes (Signed)
eLink Physician-Brief Progress Note Patient Name: Gabriel CarinaJustice Q Colgate DOB: [birth year] MRN: [id number]   Date of Service  01/16/2018  HPI/Events of Note  Lactic Acid = 4.2 --> 4.3 --> 3.4  eICU Interventions  Continue to trend lactic acid.      Intervention Category Major Interventions: Acid-Base disturbance - evaluation and management  Sommer,Steven Eugene 01/16/2018, 6:29 AM

## 2018-01-17 ENCOUNTER — Inpatient Hospital Stay (HOSPITAL_COMMUNITY): Payer: Medicaid Other

## 2018-01-17 LAB — RENAL FUNCTION PANEL
ALBUMIN: 2.9 g/dL — AB (ref 3.5–5.0)
ANION GAP: 10 (ref 5–15)
BUN: 20 mg/dL (ref 6–20)
CHLORIDE: 109 mmol/L (ref 98–111)
CO2: 24 mmol/L (ref 22–32)
Calcium: 8.5 mg/dL — ABNORMAL LOW (ref 8.9–10.3)
Creatinine, Ser: 0.99 mg/dL (ref 0.61–1.24)
GFR calc Af Amer: >60 mL/min (ref >60)
GLUCOSE: 231 mg/dL — AB (ref 70–99)
PHOSPHORUS: 1.2 mg/dL — AB (ref 2.5–4.6)
POTASSIUM: 3.3 mmol/L — AB (ref 3.5–5.1)
Sodium: 143 mmol/L (ref 135–145)

## 2018-01-17 LAB — CBC
HEMATOCRIT: 38.1 % — AB (ref 39.0–52.0)
HEMOGLOBIN: 12.2 g/dL — AB (ref 13.0–17.0)
MCH: 24.6 pg — ABNORMAL LOW (ref 26.0–34.0)
MCHC: 32 g/dL (ref 30.0–36.0)
MCV: 77 fL — ABNORMAL LOW (ref 78.0–100.0)
Platelets: 265 10*3/uL (ref 150–400)
RBC: 4.95 MIL/uL (ref 4.22–5.81)
RDW: 15.5 % (ref 11.5–15.5)
WBC: 16.3 10*3/uL — ABNORMAL HIGH (ref 4.0–10.5)

## 2018-01-17 LAB — GLUCOSE, CAPILLARY
GLUCOSE-CAPILLARY: 223 mg/dL — AB (ref 70–99)
GLUCOSE-CAPILLARY: 190 mg/dL — AB (ref 70–99)
GLUCOSE-CAPILLARY: 184 mg/dL — AB (ref 70–99)
Glucose-Capillary: 144 mg/dL — ABNORMAL HIGH (ref 70–99)
Glucose-Capillary: 128 mg/dL — ABNORMAL HIGH (ref 70–99)
Glucose-Capillary: 122 mg/dL — ABNORMAL HIGH (ref 70–99)

## 2018-01-17 LAB — ANTIPHOSPHOLIPID SYNDROME EVAL, BLD
DRVVT: 34.8 s (ref 0.0–47.0)
PTT Lupus Anticoagulant: 29.9 s (ref 0.0–51.9)
Phosphatydalserine, IgA: 3 APS IgA (ref 0–20)
Phosphatydalserine, IgG: 4 GPS IgG (ref 0–11)
Phosphatydalserine, IgM: 8 MPS IgM (ref 0–25)

## 2018-01-17 LAB — PHOSPHORUS: Phosphorus: 1.2 mg/dL — ABNORMAL LOW (ref 2.5–4.6)

## 2018-01-17 LAB — CULTURE, BAL-QUANTITATIVE: CULTURE: NO GROWTH

## 2018-01-17 LAB — MAGNESIUM: MAGNESIUM: 2.1 mg/dL (ref 1.7–2.4)

## 2018-01-17 LAB — CULTURE, BAL-QUANTITATIVE W GRAM STAIN

## 2018-01-17 LAB — PROCALCITONIN: PROCALCITONIN: 2.35 ng/mL

## 2018-01-17 MED ORDER — PREDNISONE 20 MG PO TABS
50.0000 mg | ORAL_TABLET | Freq: Every day | ORAL | Status: DC
  Administered 2018-01-18 – 2018-01-19 (×2): 50 mg via ORAL
  Filled 2018-01-17: qty 2
  Filled 2018-01-17: qty 1

## 2018-01-17 MED ORDER — ALBUTEROL SULFATE (2.5 MG/3ML) 0.083% IN NEBU: 2.5000 mg | INHALATION_SOLUTION | RESPIRATORY_TRACT | Status: DC | PRN

## 2018-01-17 MED ORDER — INSULIN ASPART 100 UNIT/ML ~~LOC~~ SOLN
2.0000 [IU] | SUBCUTANEOUS | Status: DC | PRN
  Administered 2018-01-17: 4 [IU] via SUBCUTANEOUS
  Administered 2018-01-17: 2 [IU] via SUBCUTANEOUS
  Filled 2018-01-17 (×2): qty 0.07

## 2018-01-17 MED ORDER — POTASSIUM PHOSPHATES 15 MMOLE/5ML IV SOLN
20.0000 mmol | Freq: Once | INTRAVENOUS | Status: AC
  Administered 2018-01-17: 20 mmol via INTRAVENOUS
  Filled 2018-01-17: qty 6.67

## 2018-01-17 MED ORDER — ACETAMINOPHEN 160 MG/5ML PO SOLN
650.0000 mg | Freq: Four times a day (QID) | ORAL | Status: DC | PRN
  Administered 2018-01-17: 650 mg
  Filled 2018-01-17: qty 20.3

## 2018-01-17 NOTE — Progress Notes (Addendum)
..  Name: Billy Ayala MRN: [id number] DOB: August 06, 2000    ADMISSION DATE:  01/14/2018 CONSULTATION DATE:  01/14/18  REFERRING MD :  EDP  CHIEF COMPLAINT:  Seizure and hemoptysis   HISTORY OF PRESENT ILLNESS:  (History obtained from EMR, patient's family including mother and grandmother and the account of other providers)  17 yr old male with PMHx significant for Sickle cell trait and Autism presents after found having a seizure that lasted 1 minute. He was confused post event for a total of 15 mins.  Mother reports compliance with his Keppra and states that they had a Neuro appointment for August (which was the first available); no planned follow-up with Pulmonology.  It was not until he arrived to the ED that he began having hemoptysis and SOB, requiring intubation.   SUBJECTIVE:  Tolerating 5/5 PS Remains on Precedex and fentanyl infusions Overnight course complicated by agitation.   VITAL SIGNS: Temp:  [99.1 F (37.3 C)-99.7 F (37.6 C)] 99.7 F (37.6 C) (07/17 0700) Pulse Rate:  [62-125] 83 (07/17 0830) Resp:  [11-30] 12 (07/17 0830) BP: (105-170)/(82-106) 142/96 (07/17 0830) SpO2:  [92 %-100 %] 98 % (07/17 0830) Arterial Line BP: (149-170)/(85-102) 167/94 (07/17 0830) FiO2 (%):  [40 %] 40 % (07/17 0705) Weight:  [108.6 kg (239 lb 6.7 oz)] 108.6 kg (239 lb 6.7 oz) (07/17 0335)  PHYSICAL EXAMINATION: General:  Teenage male in vent in NAD HEENT: [place]/AT, PERRL, no JVD Neuro: sedated, opens eyes to voice, interacts purposefully.  CV: IRIR, no MRG PULM: clear bilateral breath sounds with minimal bloody secretions.  GI: soft, non-tender, hypoactive, BS  Extremities: warm/dry, trace edema Skin: no rashes    Recent Labs  Lab 01/15/18 0213 01/16/18 0516 01/17/18 0334  NA 139 143 143  K 5.0 4.0 3.3*  CL 108 112* 109  CO2 23 21* 24  BUN _0 CREATININE 1.13 1.03 0.99  GLUCOSE 140* 180* 231*   Recent Labs  Lab 01/15/18 0213 01/15/18 1223 01/16/18 0516  01/17/18 0334  HGB 13.7 12.2* 12.1* 12.2*  HCT 42.1 38.2* 37.4* 38.1*  WBC 21.4*  --  15.5* 16.3*  PLT 262  --  243 265   Dg Chest Port 1 View  Result Date: 01/16/2018 CLINICAL DATA:  17 year old male status post seizure.  Hemoptysis. EXAM: PORTABLE CHEST 1 VIEW COMPARISON:  Chest CTA 01/14/2018 and earlier. FINDINGS: Portable AP semi upright view at 0420 hours. Stable endotracheal tube, tip at the level the clavicles. Enteric tube loops in the proximal stomach and continues distally, tip not included. Stable lung volumes. Mediastinal contours remain within normal limits. Confluent bilateral basilar predominant pulmonary opacity seen to reflect a combination of consolidation and centrilobular ground-glass on 01/14/2018 appears regressed. Confluent residual in both lower lungs. No superimposed pneumothorax. No pleural effusion is evident. Paucity of bowel gas in the upper abdomen. No acute osseous abnormality identified. IMPRESSION: 1.  Stable lines and tubes. 2. Improved bilateral ventilation with regression of confluent bilateral pulmonary opacity since 01/14/2018, compatible with improving pneumonia or ARDS. Electronically Signed   By: Genevie Ann M.D.   On: 01/16/2018 06:21    SIGNIFICANT EVENTS  7/14 Admit - Seizure/ SOB/ Hemoptysis/ intubated  STUDIES:  7/14 CTA PE >> 1. Bilateral airspace consolidation and confluent ground-glass densities relatively similar to the CT of 12/12/2017. Findings likely represent aspiration and ARDS given provided history of seizure. Clinical correlation is recommended. 2. No large pulmonary embolus. 3. Endotracheal tube above the carina and enteric  tube in the Stomach.  CULTURES:  7/14 BCx2  >>  7/14 MRSA PCR >> neg   7/15 RVP >> negative  7/15 trach asp >> 7/15 BAL >> 7/15 BAL CMV >> 7/15 BAL viral  >> 7/15 BAL cytology >  Antibiotics:  7/14 zosyn >>  BRIEF PATIENT DESCRIPTION: 17 yr old male with PMHx significant for Sickle cell trait and Autism  presents after found having a seizure that lasted 1 minute. He was confused post event and on presentation to the ED began having hemoptysis and SOB, intubated for airway protection.  ASSESSMENT / PLAN:  Acute Hypoxic Respiratory Failure Hemoptysis-  Concern for DAH vs vasculitis/ autoimmune process, prev bronch in June with serial bloody aliquots; rapid improvement after steroids/ tx for aspiration; no etiology determined, autoimmune neg in June and negative again here this admission. Responding to steroids. -Weaning PS 5/5 -Hopeful for extubation today -VAP bundle -Pepcid for SUP  -Continue solumedrol 162m q 6 for now  Pending autoimmune panel C3/ C4 > normal ESR/ CRP  >  normal ANA >> neg ANCA titers>> neg ANCA antibodies >> neg Anti-phos lipid >> neg, wnl DS DNA >.neg CCP antibody > wnl sSa antibody >  NEURO: Seizure disorder - dx 12/2017 Hx Autism  - CTH and MRI 12/2017 normal  - spoke with neurology 7/15 with recs to increase keppra dose and if no further events, follow-up as outpatient as scheduled in August P: -Continue increased Keppra dose 750 mg BID through outpatient follow up.  -Seizure precautions -PAD protocol for RASS goal: 0/ -1  -Attempt to minimize fentanyl , continue precedex gtt -Daily wake up assessments -If further seizure activity noted/suspected, will need to formally consult neurology   CARDIOLOGY Hypertension:  -Hope this will improve after extubation, if not will need to add some antihypertensives. -Tele monitoring -DC art line - May need diuresis at some point.   ID: R/o Sepsis - PCT initially negative, today elevated at 3.51- unclear the significance given that patient remains afebrile, improving WBC, and improving CXR P:  RVP negative Following cultures - blood, sputum, BAL Continue  Zosyn complete 5 day course. > 7/19 Trend WBC/ fever curve     Endocrine: Hyperglycemia - in the setting of high dose steroids Continue CBG q  4 Increase SSI to resistant scale  GI: No acute issues  - last BM prior to admit P: NPO TF on hold. Bowel regimen for PAD protocol - scheduled colace, prn dulcolax    Heme: Hemoptysis  - Noted family h/o scleroderma - hemoglobin 15 -> 13.7 -> 12.2 ->12.1  - near gram drop daily - will monitor  - iron studies show low iron and saturation ratios, normal TIBC/ ferritin P:  Monitor bleeding CBC in am autoimmune panel pending  Transfuse for Hgb<7  DVT PPx-> SCDs and Lovenox [place] If no clear etiology discovered should probably have rheumatology follow up as outpatient.   RENAL Hypokalemia Lactic acidosis  > improved - + 8.2 L/ wt up >10 lbs since admit if correct P:  Change from NS to LR at 75 ml/hr Trend BMP / mag/ phos/ daily wt/ urinary output Avoid nephrotoxic agents, ensure adequate renal perfusion Replace K today.   PGeorgann Housekeeper AGACNP-BC [facility] Pager [phone] [phone] 01/17/2018 9:36 AM   Attending Note:  I have examined patient, reviewed labs, studies and notes. I have discussed the case with PJaclynn Guarneri and I agree with the data and plans as amended above.  18 year old man with a history of sickle cell trait, autism.  He is hospitalized with his second bout of acute respiratory failure in the setting of acute quick onset bilateral pulmonary infiltrates and hemoptysis.  Etiology unclear.  He has a negative autoimmune panel from the initial hospitalization and no positive autoimmune studies at this time either.  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  Is also been speculated that he may have experienced intermittent aspiration events that caused an acute injury, infiltrates and bleeding.  On both occasions he is responded quickly and significantly to corticosteroids.  Respiratory virus panel negative, viral culture from his BAL as well as other cultures are all pending.  His chest x-ray  today is markedly improved.  He is tolerating pressure support and meets criteria for extubation.   Vitals:   01/17/18 1300 01/17/18 1330 01/17/18 1400 01/17/18 1500  BP: 139/90 (!) 149/97 129/88 139/85  Pulse: 65 (!) 56 (!) 55 (!) 55  Resp: 16 (!) 21 (!) 21 18  Temp: 99.1 F (37.3 C)     TempSrc: Oral     SpO2: 97% 98% 97% 97%  Weight:      Height:      On evaluation he is calm on mechanical ventilation, is tolerating pressure support without difficulty.  No significant blood seen his endotracheal tube.  He has some coarse bilateral breath sounds, no wheezing.  His heart regular without a murmur.  His abdomen is soft, nontender, benign with positive bowel sounds.  He has no significant edema.  Difficult to assess whether he can follow all commands but he is able to communicate with his mother who is at bedside.  We will plan to extubate today.  Continue his corticosteroids, decreased from high-dose Solu-Medrol to prednisone 50 mg daily beginning on 7/17.  We will need to plan for a stable chronic dose for him to continue for several weeks post hospitalization.  He will need to be followed for any recurrence once prednisone has been tapered off.  If he does have recurrent infiltrates it may be beneficial to consider VATS biopsy if we still do not have a diagnosis.  Clearly he will need to continue swallowing precautions.  His mother helps him with this. Continue zosyn for now but can likely d/c on 7/18 if no positive cx data from his BAL.   Ok to transfer out of the ICU 7/17 pm. We will ask FPTS to take on his care. We will continue to follow him as a pulm consult. He will need to be seen in our office as an outpt.    Independent critical care time is 35 minutes.   Baltazar Apo, MD, PhD 01/17/2018, 3:53 PM [facility] [phone] or if no answer [phone]

## 2018-01-17 NOTE — Procedures (Signed)
Extubation Procedure Note  Patient Details:   Name: Billy Ayala DOB: March 11, 2001 MRN: [id number]   Airway Documentation:    Vent end date: 01/17/18 Vent end time: 1155   Evaluation  O2 sats: stable throughout Complications: No apparent complications Patient did tolerate procedure well. Bilateral Breath Sounds: Clear, Diminished   No PT is non verbal  PT was extubated to a 3L [place]  Strong cough  sats are stable  RT to monitor   Ezrie Bunyan, Duane LopeJeffrey D 01/17/2018, 11:56 AM

## 2018-01-17 NOTE — Progress Notes (Addendum)
Family Medicine Teaching Service Daily Progress Note Intern Pager: [phone]  Patient name: Billy Ayala Medical record number: [id number] Date of birth: 09-27-2000 Age: 17 y.o. Gender: male  Primary Care Provider: Steve Rattler, DO Consultants: Critical Care Code Status: Full  Pt Overview and Major Events to Date:  7/14 admitted for pneumonia following seizure activity 7/14 intubated, ICU 7/15 bronchoscopy performed with BAL 7/17 extubated 7/18 moved to floor  Assessment and Plan: Chest is [place] is an 17 year old male with previous medical history of autism spectrum disorder, seizures, and sickle cell trait who was admitted on 7/14 for hemoptysis and SOB following seizure-like activity.  Patient was intubated for airway protection and has been cared for in the ICU from 7/14 to 7/17.  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  Bronchoscopy showed diffuse blood throughout lungs  Autoimmune work-up so far includes: C3/ C4: normal, ESR/ CRP:  normal , ANA: neg , ANCA titers: neg , ANCA antibodies: neg , Anti-phos lipid: neg / wnl, DS DNA: neg, CCP antibody: wnl , sSa antibody: pending. Hgb 16 (7/14) ->12.2(7/18) - Transfuse for Hgb below 7 - Monitor hemoglobin, 15 (7/14) ->12.2(7/18) - DCed Solu-Medrol 125 mg every 6 hours - Prednisone 50 mg Q Daily  - Will require steroid taper, pulm recs appreciated - SSa pending, remainder of autoimmune panel above - f/u pulm - f/u rheum  Pneumonia On admission patient was afebrile, WBC WNL, SOB, lactic acid 3.35 and a chest x-ray consistent with multilobar pneumonia.  CTA on 7/14 revealed groundglass densities and bilateral airspace consolidation that may represent aspiration and ARDS. lactic acid 3.35->2.9->4.2->4.3->3.4->2.9.  Low suspicion for pneumonia at this point but will complete  course of antibiotics ending on 7/19. - Zosyn 7/14 - 7/19 - no significant Cx results to date  Acute hypoxic respiratory failure s/p intubation for airway protection Patient presented to the ED shortly after a seizure episode.  In the ED is found to have a lactate of 3.35 in addition to having an episode of hematemesis and was actively short of breath.  Critical care was consulted patient was intubated for airway protection and started on high-dose steroids. - Extubated 7/17 - SLP - albuterol Q 4 hrs PRN  Seizure disorder Patient was recently diagnosed with epilepsy takes Keppra 500 mg twice daily at home.  Patient presented to the ED shortly after a seizure episode followed by hematemesis.  -Keppra increased to 750 mg BID  Hyperglycemia in the setting of high dose steroids No PMH of diabetes or hyperglycemia.  Patient has been on SSI he in the ICU. - aspart 2-7 units Q 4 hrs PRN  Hypertension No medical history of hypertension pressures generally controlled in ICU.  3 doses of hydralazine given during ICU stay. - hydralazine for systolic>180  Constipation - Docusate - bisacodyl suppository PRN  FEN/GI: this fluids PPx: none  Disposition: Stable  Subjective:  Patient seen today resting comfortably in bed.  Most difficult to communicate with patient.  He does not appear to be in any acute distress.  No new problems or overnight problems per nurse.  Objective: Temp:  [98.5 F (36.9 C)-101.3 F (38.5 C)] 98.5 F (36.9 C) (07/17 1500) Pulse Rate:  [55-125] 67 (07/17 1600) Resp:  [11-24] 17 (07/17 1600) BP: (129-161)/(83-111) 136/93 (07/17 1600) SpO2:  [95 %-100 %] 95 % (07/17 1600) Arterial Line BP: (149-173)/(85-101) 171/97 (07/17 1030) FiO2 (%):  [  40 %] 40 % (07/17 0705) Weight:  [239 lb 6.7 oz (108.6 kg)] 239 lb 6.7 oz (108.6 kg) (07/17 0335) Physical Exam: General: Alert and cooperative and appears to be in no acute distress. Resting comfortably in hospital bed.  Soft  restraints still on from intubation. HEENT: Neck non-tender without lymphadenopathy, masses or thyromegaly Cardio: Normal A1 and S2, no S3 or S4. Rhythm and rate regular. No murmurs or rubs.   Pulm: minimal crackles in middle lung fields. Normal respiratory effort Abdomen: Bowel sounds normal. Abdomen soft and non-tender.  Extremities: No peripheral edema. Warm/ well perfused.  Strong radial and pedal pulses. Neuro: Cranial nerves grossly intact  Laboratory: Recent Labs  Lab 01/15/18 0213 01/15/18 1223 01/16/18 0516 01/17/18 0334  WBC 21.4*  --  15.5* 16.3*  HGB 13.7 12.2* 12.1* 12.2*  HCT 42.1 38.2* 37.4* 38.1*  PLT 262  --  243 265   Recent Labs  Lab 01/14/18 1925 01/15/18 0213 01/16/18 0516 01/16/18 1054 01/17/18 0334  NA 138 139 143  --  143  K 4.0 5.0 4.0  --  3.3*  CL 103 108 112*  --  109  CO2 26 23 21*  --  24  BUN _0 --  20  CREATININE 1.08 1.13 1.03  --  0.99  CALCIUM 9.3 8.5* 8.7*  --  8.5*  PROT 7.1  --   --  6.2*  --   BILITOT 0.5  --   --  1.1  --   ALKPHOS 92  --   --  63  --   ALT 33  --   --  25  --   AST 33  --   --  23  --   GLUCOSE 111* 140* 180*  --  231*     Imaging/Diagnostic Tests: Ct Angio Chest Pe W Or Wo Contrast  Result Date: 01/14/2018 CLINICAL DATA:  17 year old male with seizure and hemoptysis. EXAM: CT ANGIOGRAPHY CHEST WITH CONTRAST TECHNIQUE: Multidetector CT imaging of the chest was performed using the standard protocol during bolus administration of intravenous contrast. Multiplanar CT image reconstructions and MIPs were obtained to evaluate the vascular anatomy. CONTRAST:  20m ISOVUE-370 IOPAMIDOL (ISOVUE-370) INJECTION 76% COMPARISON:  Chest radiograph dated 01/14/2018 FINDINGS: Cardiovascular: There is no cardiomegaly or pericardial effusion. The thoracic aorta is unremarkable. The origins of the great vessels of the aortic arch appear patent. No CT evidence of pulmonary embolism. Mediastinum/Nodes: There is no hilar or  mediastinal adenopathy. No mediastinal fluid collection or hematoma. Anterior mediastinal soft tissue likely residual thymic gland. Lungs/Pleura: An endotracheal tube is noted with tip approximately 3 cm above the carina. The central airways are patent. There are large areas of consolidative changes in the right upper lobe and right lower lobe as well as diffuse bilateral confluent ground-glass airspace opacities relatively similar to prior CT of 12/12/2017. Findings may represent aspiration and ARDS. Clinical correlation is recommended. Upper Abdomen: Enteric tube within the esophagus extends into the stomach. The tube makes a single loop in the gastric fundus and extends distally into the body of the stomach with tip beyond the inferior margin of the image but likely in the mid body of the stomach. Musculoskeletal: No chest wall abnormality. No acute or significant osseous findings. Review of the MIP images confirms the above findings. IMPRESSION: 1. Bilateral airspace consolidation and confluent ground-glass densities relatively similar to the CT of 12/12/2017. Findings likely represent aspiration and ARDS given provided history of seizure. Clinical correlation  is recommended. 2. No large pulmonary embolus. 3. Endotracheal tube above the carina and enteric tube in the stomach. Electronically Signed   By: Anner Crete M.D.   On: 01/14/2018 23:18   Dg Chest Port 1 View  Result Date: 01/17/2018 CLINICAL DATA:  Respiratory failure.  Ventilator support. EXAM: PORTABLE CHEST 1 VIEW COMPARISON:  01/16/2018 FINDINGS: Endotracheal tube tip is 4 cm above the carina. Nasogastric tube enters the stomach. Edema is diminished. Some persistent bilateral lower lobe density consistent with mild edema/atelectasis. No effusions. No worsening or new finding. IMPRESSION: Radiographic improvement with less alveolar density. Electronically Signed   By: Nelson Chimes M.D.   On: 01/17/2018 10:31   Dg Chest Port 1 View  Result  Date: 01/16/2018 CLINICAL DATA:  17 year old male status post seizure.  Hemoptysis. EXAM: PORTABLE CHEST 1 VIEW COMPARISON:  Chest CTA 01/14/2018 and earlier. FINDINGS: Portable AP semi upright view at 0420 hours. Stable endotracheal tube, tip at the level the clavicles. Enteric tube loops in the proximal stomach and continues distally, tip not included. Stable lung volumes. Mediastinal contours remain within normal limits. Confluent bilateral basilar predominant pulmonary opacity seen to reflect a combination of consolidation and centrilobular ground-glass on 01/14/2018 appears regressed. Confluent residual in both lower lungs. No superimposed pneumothorax. No pleural effusion is evident. Paucity of bowel gas in the upper abdomen. No acute osseous abnormality identified. IMPRESSION: 1.  Stable lines and tubes. 2. Improved bilateral ventilation with regression of confluent bilateral pulmonary opacity since 01/14/2018, compatible with improving pneumonia or ARDS. Electronically Signed   By: Genevie Ann M.D.   On: 01/16/2018 06:21   Dg Chest Portable 1 View  Result Date: 01/14/2018 CLINICAL DATA:  Pneumonia. Evaluate endotracheal and nasogastric tube placements. EXAM: PORTABLE CHEST 1 VIEW COMPARISON:  Earlier today at 1915 hours. FINDINGS: 2146 hours. Endotracheal tube terminates 3.7 cm cm above carina.Nasogastric tube is looped in the stomach with the side port extending below the inferior aspect of the film. Normal heart size. Mild right hemidiaphragm elevation. No pleural effusion or pneumothorax. Right greater than left airspace disease may be slightly improved on the left. Relative sparing of the subpleural spaces. IMPRESSION: Appropriate position of endotracheal tube. Nasogastric tube extends beyond the inferior aspect of the film. Multifocal airspace disease, with possible improvement on the left. Electronically Signed   By: Abigail Miyamoto M.D.   On: 01/14/2018 22:09   Dg Chest Port 1 View  Result Date:  01/14/2018 CLINICAL DATA:  Cough, dyspnea and seizure.  Hemoptysis. EXAM: PORTABLE CHEST 1 VIEW COMPARISON:  12/16/2017 FINDINGS: The heart size and mediastinal contours are within normal limits. Multilobar airspace opacities throughout both lungs consistent with multilobar pneumonia possibly related to aspiration given history of seizure. Given history of hemoptysis, pulmonary hemorrhage or stigmata of ARDS might also account for some of the opacities seen. Stigmata of pulmonary edema is within the differential as well. The visualized skeletal structures are unremarkable. IMPRESSION: Multilobar airspace disease compatible with multilobar pneumonia possibly aspiration related given history of seizure. Other possibilities may include pulmonary hemorrhage, ARDS and/or pulmonary edema. Electronically Signed   By: Ashley Royalty M.D.   On: 01/14/2018 19:48     Matilde Haymaker, MD 01/17/2018, 4:08 PM PGY-1, [facility] Intern pager: [phone], text pages welcome

## 2018-01-17 NOTE — Progress Notes (Signed)
  Social Note  Visited [place] and spoke with mother. No answers on labs so far. Neno remains intubated but is on light sedation. Hopeful for extubation today. Appreciate excellent care from CCM and happy to take over once he is transitioned to the medical floor.  Dolores PattyAngela Odus Clasby, DO PGY-2, [facility] 01/17/2018 8:11 AM   FAMILY MEDICINE TEACHING SERVICE  - Please contact intern pager [phone] (via phone or AMION, login: [email or web address]) for questions regarding care. Text pages welcome. Please DO NOT page attending.

## 2018-01-18 DIAGNOSIS — D571 Sickle-cell disease without crisis: Secondary | ICD-10-CM

## 2018-01-18 DIAGNOSIS — Z9289 Personal history of other medical treatment: Secondary | ICD-10-CM

## 2018-01-18 LAB — CBC
HEMATOCRIT: 42.3 % (ref 39.0–52.0)
Hemoglobin: 14 g/dL (ref 13.0–17.0)
MCH: 25.3 pg — ABNORMAL LOW (ref 26.0–34.0)
MCHC: 33.1 g/dL (ref 30.0–36.0)
MCV: 76.4 fL — AB (ref 78.0–100.0)
Platelets: 267 10*3/uL (ref 150–400)
RBC: 5.54 MIL/uL (ref 4.22–5.81)
RDW: 15.5 % (ref 11.5–15.5)
WBC: 19.3 10*3/uL — AB (ref 4.0–10.5)

## 2018-01-18 LAB — GLUCOSE, CAPILLARY
GLUCOSE-CAPILLARY: 116 mg/dL — AB (ref 70–99)
GLUCOSE-CAPILLARY: 128 mg/dL — AB (ref 70–99)
Glucose-Capillary: 114 mg/dL — ABNORMAL HIGH (ref 70–99)

## 2018-01-18 LAB — BASIC METABOLIC PANEL
Anion gap: 11 (ref 5–15)
BUN: 16 mg/dL (ref 6–20)
CALCIUM: 8.7 mg/dL — AB (ref 8.9–10.3)
CHLORIDE: 101 mmol/L (ref 98–111)
CO2: 29 mmol/L (ref 22–32)
CREATININE: 0.84 mg/dL (ref 0.61–1.24)
GFR calc Af Amer: >60 mL/min (ref >60)
GFR calc non Af Amer: >60 mL/min (ref >60)
GLUCOSE: 112 mg/dL — AB (ref 70–99)
Potassium: 3.9 mmol/L (ref 3.5–5.1)
Sodium: 141 mmol/L (ref 135–145)

## 2018-01-18 LAB — MAGNESIUM: Magnesium: 2.2 mg/dL (ref 1.7–2.4)

## 2018-01-18 LAB — PHOSPHORUS: Phosphorus: 4.5 mg/dL (ref 2.5–4.6)

## 2018-01-18 MED ORDER — SODIUM CHLORIDE 0.9 % IV SOLN
600.0000 mg | Freq: Once | INTRAVENOUS | Status: AC
  Administered 2018-01-18: 600 mg via INTRAVENOUS
  Filled 2018-01-18: qty 10

## 2018-01-18 MED ORDER — MIDAZOLAM HCL 5 MG/ML IJ SOLN: 1.0000 mg | INTRAMUSCULAR | Status: DC | PRN

## 2018-01-18 MED ORDER — LORAZEPAM 2 MG/ML IJ SOLN: 4.0000 mg | INTRAMUSCULAR | Status: DC | PRN

## 2018-01-18 MED ORDER — LORAZEPAM 2 MG/ML IJ SOLN: 1.0000 mg | Freq: Four times a day (QID) | INTRAMUSCULAR | Status: DC | PRN

## 2018-01-18 NOTE — Progress Notes (Signed)
..  Name: Billy Ayala MRN: [id number] DOB: 03/21/2001    ADMISSION DATE:  01/14/2018 CONSULTATION DATE:  01/14/18  REFERRING MD :  EDP  CHIEF COMPLAINT:  Seizure and hemoptysis   HISTORY OF PRESENT ILLNESS:  (History obtained from EMR, patient's family including mother and grandmother and the account of other providers)  17 yr old male with PMHx significant for Sickle cell trait and Autism presents after found having a seizure that lasted 1 minute. He was confused post event for a total of 15 mins.  Mother reports compliance with his Keppra and states that they had a Neuro appointment for August (which was the first available); no planned follow-up with Pulmonology.  It was not until he arrived to the ED that he began having hemoptysis and SOB, requiring intubation.   SUBJECTIVE:  Tolerating extubation well. Dry cough started this AM. No other complaints.   VITAL SIGNS: Temp:  [97.6 F (36.4 C)-101.3 F (38.5 C)] 97.6 F (36.4 C) (07/18 0733) Pulse Rate:  [52-80] 65 (07/18 0300) Resp:  [12-26] 18 (07/18 0300) BP: (122-149)/(75-111) 126/83 (07/18 0300) SpO2:  [95 %-98 %] 98 % (07/18 0300) Arterial Line BP: (165-173)/(90-100) 171/97 (07/17 1030) Weight:  [109.7 kg (241 lb 13.5 oz)] 109.7 kg (241 lb 13.5 oz) (07/18 0500)  PHYSICAL EXAMINATION: General:  Teenage male resting comfortably in bed.  HEENT: [place]/AT, PERRL, no JVD Neuro: Alert, pleasant. Follows commands.  CV: RRR, no MRG PULM: Clear bilateral breath sounds. No stridor.  GI: soft, non-tender, hypoactive, BS  Extremities: warm/dry, no deformity.  Skin: no rashes    Recent Labs  Lab 01/16/18 0516 01/17/18 0334 01/18/18 0455  NA 143 143 141  K 4.0 3.3* 3.9  CL 112* 109 101  CO2 21* 24 29  BUN _0 CREATININE 1.03 0.99 0.84  GLUCOSE 180* 231* 112*   Recent Labs  Lab 01/15/18 0213 01/15/18 1223 01/16/18 0516 01/17/18 0334  HGB 13.7 12.2* 12.1* 12.2*  HCT 42.1 38.2* 37.4* 38.1*  WBC 21.4*  --   15.5* 16.3*  PLT 262  --  243 265   Dg Chest Port 1 View  Result Date: 01/17/2018 CLINICAL DATA:  Respiratory failure.  Ventilator support. EXAM: PORTABLE CHEST 1 VIEW COMPARISON:  01/16/2018 FINDINGS: Endotracheal tube tip is 4 cm above the carina. Nasogastric tube enters the stomach. Edema is diminished. Some persistent bilateral lower lobe density consistent with mild edema/atelectasis. No effusions. No worsening or new finding. IMPRESSION: Radiographic improvement with less alveolar density. Electronically Signed   By: Nelson Chimes M.D.   On: 01/17/2018 10:31    SIGNIFICANT EVENTS  7/14 Admit - Seizure/ SOB/ Hemoptysis/ intubated  STUDIES:  7/14 CTA PE >> 1. Bilateral airspace consolidation and confluent ground-glass densities relatively similar to the CT of 12/12/2017. Findings likely represent aspiration and ARDS given provided history of seizure. Clinical correlation is recommended. 2. No large pulmonary embolus. 3. Endotracheal tube above the carina and enteric tube in the Stomach.  CULTURES:  7/14 BCx2  >>  7/14 MRSA PCR >> neg   7/15 RVP >> negative  7/15 trach asp >> 7/15 BAL >> neg 7/15 BAL CMV >> neg prelim >>> 7/15 BAL viral  >> neg prelim >>> 7/15 BAL cytology > negative  Antibiotics:  7/14 zosyn > 7/18  Discussion: 17 yr old male with PMHx significant for Sickle cell trait and Autism presents after found having a seizure that lasted 1 minute. He was confused post event and on presentation  to the ED began having hemoptysis and SOB, intubated for airway protection. 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 He improved once again with steroids and was able to be extubated 7/17. Autoimmune serologic workup negative.   ASSESSMENT / PLAN:  Acute Hypoxic Respiratory Failure Hemoptysis-  Concern for DAH vs vasculitis/ autoimmune process, prev bronch in June with serial bloody aliquots; rapid improvement after steroids/ tx for  aspiration; no etiology determined, autoimmune neg in June and negative again here this admission. Responding to steroids.  Pending autoimmune panel C3/ C4 > normal ESR/ CRP  >  normal ANA >> neg ANCA titers>> neg ANCA antibodies >> neg Anti-phos lipid >> neg, wnl DS DNA > neg CCP antibody > wnl sSa antibody >  Plan: Prednisone 65m started 7/18 Would continue steroids until outpatient follow up.  Keep 598mfor now and will plan to taper in a couple days if his course continues its current trajectory. Outpatient pulmonary follow up arranged for 8/1.  Seizure disorder - dx 12/2017, Autism - CTH and MRI 12/2017 normal  Plan: -Continue increased Keppra dose 750 mg BID through outpatient follow up in August per neurology.  -If further seizure activity noted/suspected, will need to formally consult neurology   ? Aspiration pneumonia. Doubt. RVP negative. BAL so far negative Plan: Continue to follow cultures.  DC zosyn today. S/p 5 days.   PaGeorgann HousekeeperAGACNP-BC [facility] Pager [phone] ([phone]/18/2019 8:39 AM

## 2018-01-19 DIAGNOSIS — F840 Autistic disorder: Secondary | ICD-10-CM

## 2018-01-19 DIAGNOSIS — D571 Sickle-cell disease without crisis: Secondary | ICD-10-CM

## 2018-01-19 DIAGNOSIS — Z9289 Personal history of other medical treatment: Secondary | ICD-10-CM

## 2018-01-19 LAB — CBC
HEMATOCRIT: 44.3 % (ref 39.0–52.0)
HEMOGLOBIN: 14.3 g/dL (ref 13.0–17.0)
MCH: 24.6 pg — AB (ref 26.0–34.0)
MCHC: 32.3 g/dL (ref 30.0–36.0)
MCV: 76.2 fL — ABNORMAL LOW (ref 78.0–100.0)
Platelets: 312 10*3/uL (ref 150–400)
RBC: 5.81 MIL/uL (ref 4.22–5.81)
RDW: 15.1 % (ref 11.5–15.5)
WBC: 12.8 10*3/uL — ABNORMAL HIGH (ref 4.0–10.5)

## 2018-01-19 LAB — BASIC METABOLIC PANEL
Anion gap: 11 (ref 5–15)
BUN: 12 mg/dL (ref 6–20)
CHLORIDE: 101 mmol/L (ref 98–111)
CO2: 30 mmol/L (ref 22–32)
Calcium: 8.6 mg/dL — ABNORMAL LOW (ref 8.9–10.3)
Creatinine, Ser: 0.85 mg/dL (ref 0.61–1.24)
GFR calc Af Amer: >60 mL/min (ref >60)
GFR calc non Af Amer: >60 mL/min (ref >60)
Glucose, Bld: 99 mg/dL (ref 70–99)
Potassium: 3.5 mmol/L (ref 3.5–5.1)
Sodium: 142 mmol/L (ref 135–145)

## 2018-01-19 LAB — GLUCOSE, CAPILLARY
Glucose-Capillary: 105 mg/dL — ABNORMAL HIGH (ref 70–99)
Glucose-Capillary: 116 mg/dL — ABNORMAL HIGH (ref 70–99)

## 2018-01-19 LAB — CULTURE, BLOOD (ROUTINE X 2)
Culture: NO GROWTH
Culture: NO GROWTH
Special Requests: ADEQUATE
Special Requests: ADEQUATE

## 2018-01-19 MED ORDER — FAMOTIDINE 20 MG PO TABS
20.0000 mg | ORAL_TABLET | Freq: Every day | ORAL | Status: DC
  Administered 2018-01-19: 20 mg via ORAL
  Filled 2018-01-19: qty 1

## 2018-01-19 MED ORDER — LEVETIRACETAM 750 MG PO TABS: 750.0000 mg | ORAL_TABLET | Freq: Two times a day (BID) | ORAL | 0 refills | Status: DC

## 2018-01-19 MED ORDER — PREDNISONE 20 MG PO TABS: 30.0000 mg | ORAL_TABLET | Freq: Every day | ORAL | Status: DC

## 2018-01-19 MED ORDER — PREDNISONE 10 MG PO TABS: 30.0000 mg | ORAL_TABLET | Freq: Every day | ORAL | 0 refills | Status: AC

## 2018-01-19 MED ORDER — PREDNISONE 10 MG PO TABS: 50.0000 mg | ORAL_TABLET | Freq: Every day | ORAL | 0 refills | Status: DC

## 2018-01-19 NOTE — Progress Notes (Signed)
SATURATION QUALIFICATIONS: (This note is used to comply with regulatory documentation for home oxygen)  Patient Saturations on Room Air at Rest = 100%  Patient Saturations on Room Air while Ambulating = 100%  Pt does not require any oxygen and does not wear oxygen at home. Respiratory status back to baseline. No signs of respiratory distress. Will continue to monitor pt closely. Jillyn HiddenStone,Kaymen Adrian R, RN

## 2018-01-19 NOTE — Evaluation (Signed)
Clinical/Bedside Swallow Evaluation Patient Details  Name: Billy Ayala MRN: [id number] Date of Birth: 11-Sep-2000  Today's Date: 01/19/2018 Time: SLP Start Time (ACUTE ONLY): 0848 SLP Stop Time (ACUTE ONLY): 0859 SLP Time Calculation (min) (ACUTE ONLY): 11 min  Past Medical History:  Past Medical History:  Diagnosis Date  . Autism   . Sickle cell trait (HCC)    Past Surgical History: No past surgical history on file. HPI:  17 yr old male with PMHx significant for Sickle cell trait and Autism presents after found having a seizure that lasted 1 minute. He was confused post event and on presentation to the ED 7/14 began having hemoptysis and SOB, intubated for airway protection. 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 He improved once again with steroids and was able to be extubated 7/17.    Assessment / Plan / Recommendation Clinical Impression  Pt's oropharyngeal swallow appears functional without overt signs of aspiration or dysphagia despite mildly impulsive intake. He takes large bites of food and drinks large volumes at a time, but he also takes the time to properly masticate and swallow his food before taking the next bite. His voice is soft but his mom says this is close to baseline. She does have concerns that he may be experiencing an increase in reflux and is interested in management for this - MD may wish to consider further assessment. No further acute SLP needs identified; will sign off. SLP Visit Diagnosis: Dysphagia, unspecified (R13.10)    Aspiration Risk  Mild aspiration risk    Diet Recommendation Regular;Thin liquid   Liquid Administration via: Cup;Straw Medication Administration: Whole meds with liquid Supervision: Patient able to self feed;Intermittent supervision to cue for compensatory strategies Compensations: Slow rate;Small sips/bites;Minimize environmental distractions Postural Changes: Seated upright at 90  degrees;Remain upright for at least 30 minutes after po intake    Other  Recommendations Oral Care Recommendations: Oral care BID   Follow up Recommendations None      Frequency and Duration            Prognosis        Swallow Study   General HPI: 17 yr old male with PMHx significant for Sickle cell trait and Autism presents after found having a seizure that lasted 1 minute. He was confused post event and on presentation to the ED 7/14 began having hemoptysis and SOB, intubated for airway protection. 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 He improved once again with steroids and was able to be extubated 7/17.  Type of Study: Bedside Swallow Evaluation Previous Swallow Assessment: none in chart Diet Prior to this Study: Regular;Thin liquids Temperature Spikes Noted: No Respiratory Status: Room air History of Recent Intubation: Yes Length of Intubations (days): 3 days Date extubated: 01/17/18 Behavior/Cognition: Alert;Cooperative;Pleasant mood Oral Care Completed by SLP: No Oral Cavity - Dentition: Adequate natural dentition Vision: Functional for self-feeding Self-Feeding Abilities: Able to feed self Patient Positioning: Upright in bed Baseline Vocal Quality: Low vocal intensity(mother says close to baseline)    Oral/Motor/Sensory Function Overall Oral Motor/Sensory Function: (appears functional)   Ice Chips Ice chips: Not tested   Thin Liquid Thin Liquid: Within functional limits Presentation: Self Fed(can)    Nectar Thick Nectar Thick Liquid: Not tested   Honey Thick Honey Thick Liquid: Not tested   Puree Puree: Not tested   Solid   GO   Solid: Within functional limits Presentation:  Self Georjean Mode 01/19/2018,9:44 AM  Maxcine Ham, M.A. CCC-SLP [phone]

## 2018-01-19 NOTE — Progress Notes (Signed)
Family Medicine Teaching Service Daily Progress Note Intern Pager: [phone]  Patient name: Billy Ayala Medical record number: [id number] Date of birth: 2000-08-19 Age: 17 y.o. Gender: male  Primary Care Provider: Steve Rattler, DO Consultants: Critical Care Code Status: Full  Pt Overview and Major Events to Date:  7/14 admitted for pneumonia following seizure activity 7/14 intubated, ICU 7/15 bronchoscopy performed with BAL 7/17 extubated 7/18 moved to floor 7/19 completed zosyn (7/14-7/19)  Assessment and Plan: Chest is Lina is an 17 year old male with previous medical history of autism spectrum disorder, seizures, and sickle cell trait who was admitted on 7/14 for hemoptysis and SOB following seizure-like activity.  Patient was intubated for airway protection and has been cared for in the ICU from 7/14 to 7/17.  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  Bronchoscopy showed diffuse blood throughout lungs  Autoimmune work-up so far includes: C3/ C4: normal, ESR/ CRP:  normal , ANA: neg , ANCA titers: neg , ANCA antibodies: neg , Anti-phos lipid: neg / wnl, DS DNA: neg, CCP antibody: wnl , SSa antibody: pending. Hgb 16 (7/14) ->12.2(7/18) - Transfuse for Hgb below 7 - Monitor hemoglobin, 15 (7/14) ->12.2(7/18) - DCed Solu-Medrol 125 mg every 6 hours - Prednisone 50 mg Q Daily  - Will require steroid taper, CC recs to taper in a couple days - SSa pending, remainder of autoimmune panel above - f/u pulm 8/1 - f/u rheum  Pneumonia On admission patient was afebrile, WBC WNL, SOB, lactic acid 3.35 and a chest x-ray consistent with multilobar pneumonia.  CTA on 7/14 revealed groundglass densities and bilateral airspace consolidation that may represent aspiration and ARDS. lactic acid 3.35->2.9->4.2->4.3->3.4->2.9.  Low  suspicion for pneumonia at this point but will complete course of antibiotics ending on 7/19. - Zosyn 7/14 - 7/19 - no significant Cx results to date  Acute hypoxic respiratory failure s/p intubation for airway protection Patient presented to the ED shortly after a seizure episode.  In the ED is found to have a lactate of 3.35 in addition to having an episode of hematemesis and was actively short of breath.  Critical care was consulted patient was intubated for airway protection and started on high-dose steroids. - Extubated 7/17 - SLP - albuterol Q 4 hrs PRN  Seizure disorder Patient was recently diagnosed with epilepsy takes Keppra 500 mg twice daily at home.  Patient presented to the ED shortly after a seizure episode followed by hematemesis.  -Keppra increased to 750 mg BID  Hyperglycemia in the setting of high dose steroids No PMH of diabetes or hyperglycemia.  Patient has been on SSI he in the ICU. - aspart 2-7 units Q 4 hrs PRN  Hypertension No medical history of hypertension pressures generally controlled in ICU.  3 doses of hydralazine given during ICU stay. - hydralazine for systolic>180  Constipation - Docusate - bisacodyl suppository PRN  FEN/GI: this fluids PPx: none  Disposition: DC home pending OOB and walking  Subjective:  Caeden was resting comfortably in bed this morning when I checked on him.  He had no new complaints today.  The nurse was with him and reported no acute events overnight but has been breathing well without issue.  Spoke with mom on the phone around 9:00 who asked the chest is receive something for reflux typically takes Protonix p.o. at home.  Objective: Temp:  [97.6 F (36.4 C)-98.5 F (  36.9 C)] 98.4 F (36.9 C) (07/19 0447) Pulse Rate:  [61-91] 81 (07/19 0447) Resp:  [16-27] 18 (07/19 0447) BP: (123-151)/(81-109) 150/88 (07/19 0447) SpO2:  [90 %-100 %] 98 % (07/19 0447) Weight:  [241 lb 13.5 oz (109.7 kg)] 241 lb 13.5 oz (109.7 kg)  (07/19 0447) Physical Exam: General: Alert and cooperative and appears to be in no acute distress HEENT: Neck non-tender without lymphadenopathy, masses or thyromegaly Cardio: Normal A1 and S2, no S3 or S4. Rhythm and rate regular. No murmurs or rubs.   Pulm: Clear to auscultation bilaterally, no crackles, wheezing, or diminished breath sounds. Normal respiratory effort Abdomen: Bowel sounds normal. Abdomen soft and non-tender.  Extremities: No peripheral edema. Warm/ well perfused.  Strong radial and pedal pulses. Neuro: Cranial nerves grossly intact  Laboratory: Recent Labs  Lab 01/16/18 0516 01/17/18 0334 01/18/18 0845  WBC 15.5* 16.3* 19.3*  HGB 12.1* 12.2* 14.0  HCT 37.4* 38.1* 42.3  PLT 243 265 267   Recent Labs  Lab 01/14/18 1925  01/16/18 0516 01/16/18 1054 01/17/18 0334 01/18/18 0455  NA 138   < > 143  --  143 141  K 4.0   < > 4.0  --  3.3* 3.9  CL 103   < > 112*  --  109 101  CO2 26   < > 21*  --  24 29  BUN 9   < > 16  --  20 16  CREATININE 1.08   < > 1.03  --  0.99 0.84  CALCIUM 9.3   < > 8.7*  --  8.5* 8.7*  PROT 7.1  --   --  6.2*  --   --   BILITOT 0.5  --   --  1.1  --   --   ALKPHOS 92  --   --  63  --   --   ALT 33  --   --  25  --   --   AST 33  --   --  23  --   --   GLUCOSE 111*   < > 180*  --  231* 112*   < > = values in this interval not displayed.     Imaging/Diagnostic Tests: Ct Angio Chest Pe W Or Wo Contrast  Result Date: 01/14/2018 CLINICAL DATA:  17 year old male with seizure and hemoptysis. EXAM: CT ANGIOGRAPHY CHEST WITH CONTRAST TECHNIQUE: Multidetector CT imaging of the chest was performed using the standard protocol during bolus administration of intravenous contrast. Multiplanar CT image reconstructions and MIPs were obtained to evaluate the vascular anatomy. CONTRAST:  66m ISOVUE-370 IOPAMIDOL (ISOVUE-370) INJECTION 76% COMPARISON:  Chest radiograph dated 01/14/2018 FINDINGS: Cardiovascular: There is no cardiomegaly or pericardial  effusion. The thoracic aorta is unremarkable. The origins of the great vessels of the aortic arch appear patent. No CT evidence of pulmonary embolism. Mediastinum/Nodes: There is no hilar or mediastinal adenopathy. No mediastinal fluid collection or hematoma. Anterior mediastinal soft tissue likely residual thymic gland. Lungs/Pleura: An endotracheal tube is noted with tip approximately 3 cm above the carina. The central airways are patent. There are large areas of consolidative changes in the right upper lobe and right lower lobe as well as diffuse bilateral confluent ground-glass airspace opacities relatively similar to prior CT of 12/12/2017. Findings may represent aspiration and ARDS. Clinical correlation is recommended. Upper Abdomen: Enteric tube within the esophagus extends into the stomach. The tube makes a single loop in the gastric fundus and extends distally into the  body of the stomach with tip beyond the inferior margin of the image but likely in the mid body of the stomach. Musculoskeletal: No chest wall abnormality. No acute or significant osseous findings. Review of the MIP images confirms the above findings. IMPRESSION: 1. Bilateral airspace consolidation and confluent ground-glass densities relatively similar to the CT of 12/12/2017. Findings likely represent aspiration and ARDS given provided history of seizure. Clinical correlation is recommended. 2. No large pulmonary embolus. 3. Endotracheal tube above the carina and enteric tube in the stomach. Electronically Signed   By: Anner Crete M.D.   On: 01/14/2018 23:18   Dg Chest Port 1 View  Result Date: 01/17/2018 CLINICAL DATA:  Respiratory failure.  Ventilator support. EXAM: PORTABLE CHEST 1 VIEW COMPARISON:  01/16/2018 FINDINGS: Endotracheal tube tip is 4 cm above the carina. Nasogastric tube enters the stomach. Edema is diminished. Some persistent bilateral lower lobe density consistent with mild edema/atelectasis. No effusions. No  worsening or new finding. IMPRESSION: Radiographic improvement with less alveolar density. Electronically Signed   By: Nelson Chimes M.D.   On: 01/17/2018 10:31   Dg Chest Port 1 View  Result Date: 01/16/2018 CLINICAL DATA:  17 year old male status post seizure.  Hemoptysis. EXAM: PORTABLE CHEST 1 VIEW COMPARISON:  Chest CTA 01/14/2018 and earlier. FINDINGS: Portable AP semi upright view at 0420 hours. Stable endotracheal tube, tip at the level the clavicles. Enteric tube loops in the proximal stomach and continues distally, tip not included. Stable lung volumes. Mediastinal contours remain within normal limits. Confluent bilateral basilar predominant pulmonary opacity seen to reflect a combination of consolidation and centrilobular ground-glass on 01/14/2018 appears regressed. Confluent residual in both lower lungs. No superimposed pneumothorax. No pleural effusion is evident. Paucity of bowel gas in the upper abdomen. No acute osseous abnormality identified. IMPRESSION: 1.  Stable lines and tubes. 2. Improved bilateral ventilation with regression of confluent bilateral pulmonary opacity since 01/14/2018, compatible with improving pneumonia or ARDS. Electronically Signed   By: Genevie Ann M.D.   On: 01/16/2018 06:21   Dg Chest Portable 1 View  Result Date: 01/14/2018 CLINICAL DATA:  Pneumonia. Evaluate endotracheal and nasogastric tube placements. EXAM: PORTABLE CHEST 1 VIEW COMPARISON:  Earlier today at 1915 hours. FINDINGS: 2146 hours. Endotracheal tube terminates 3.7 cm cm above carina.Nasogastric tube is looped in the stomach with the side port extending below the inferior aspect of the film. Normal heart size. Mild right hemidiaphragm elevation. No pleural effusion or pneumothorax. Right greater than left airspace disease may be slightly improved on the left. Relative sparing of the subpleural spaces. IMPRESSION: Appropriate position of endotracheal tube. Nasogastric tube extends beyond the inferior aspect  of the film. Multifocal airspace disease, with possible improvement on the left. Electronically Signed   By: Abigail Miyamoto M.D.   On: 01/14/2018 22:09   Dg Chest Port 1 View  Result Date: 01/14/2018 CLINICAL DATA:  Cough, dyspnea and seizure.  Hemoptysis. EXAM: PORTABLE CHEST 1 VIEW COMPARISON:  12/16/2017 FINDINGS: The heart size and mediastinal contours are within normal limits. Multilobar airspace opacities throughout both lungs consistent with multilobar pneumonia possibly related to aspiration given history of seizure. Given history of hemoptysis, pulmonary hemorrhage or stigmata of ARDS might also account for some of the opacities seen. Stigmata of pulmonary edema is within the differential as well. The visualized skeletal structures are unremarkable. IMPRESSION: Multilobar airspace disease compatible with multilobar pneumonia possibly aspiration related given history of seizure. Other possibilities may include pulmonary hemorrhage, ARDS and/or pulmonary edema. Electronically Signed  By: Ashley Royalty M.D.   On: 01/14/2018 19:48     Matilde Haymaker, MD 01/19/2018, 5:47 AM PGY-1, [place] Intern pager: [phone], text pages welcome

## 2018-01-19 NOTE — Progress Notes (Signed)
Patient was up walking to the bathroom.

## 2018-01-19 NOTE — Progress Notes (Signed)
 Name: Billy Ayala MRN: [id number] DOB: 08/31/2000    ADMISSION DATE:  01/14/2018 CONSULTATION DATE:  01/14/18  REFERRING MD :  EDP  CHIEF COMPLAINT:  Seizure and hemoptysis   HISTORY OF PRESENT ILLNESS:  (History obtained from EMR, patient's family including mother and grandmother and the account of other providers)  16 yr old male with PMHx significant for Sickle cell trait and Autism presents after found having a seizure that lasted 1 minute. He was confused post event for a total of 15 mins.  Mother reports compliance with his Keppra and states that they had a Neuro appointment for August (which was the first available); no planned follow-up with Pulmonology.  It was not until he arrived to the ED that he began having hemoptysis and SOB, requiring intubation.   SUBJECTIVE:  No complaints. No acute events overnight. Mom reports he did cough up some "old blood" this morning.   VITAL SIGNS: Temp:  [97.6 F (36.4 C)-98.5 F (36.9 C)] 97.6 F (36.4 C) (07/19 0706) Pulse Rate:  [70-91] 83 (07/19 0706) Resp:  [14-27] 14 (07/19 0706) BP: (125-151)/(81-109) 128/85 (07/19 0706) SpO2:  [96 %-100 %] 99 % (07/19 0706) Weight:  [109.7 kg (241 lb 13.5 oz)] 109.7 kg (241 lb 13.5 oz) (07/19 0447)  PHYSICAL EXAMINATION: General:  Teenage male resting comfortably in bed.  HEENT: [place]/AT, PERRL, no JVD Neuro: Alert, pleasant, follows commands CV: RRR, no MRG PULM: Clear bilateral breath sounds  GI: Soft, non-tender, Extremities: warm/dry, no deformity.  Skin: Grossly intact. No rash   Recent Labs  Lab 01/17/18 0334 01/18/18 0455 01/19/18 0619  NA 143 141 142  K 3.3* 3.9 3.5  CL 109 101 101  CO2 24 29 30  BUN 20 16 12  CREATININE 0.99 0.84 0.85  GLUCOSE 231* 112* 99   Recent Labs  Lab 01/17/18 0334 01/18/18 0845 01/19/18 0619  HGB 12.2* 14.0 14.3  HCT 38.1* 42.3 44.3  WBC 16.3* 19.3* 12.8*  PLT 265 267 312   No results found.  SIGNIFICANT EVENTS  7/14 Admit -  Seizure/ SOB/ Hemoptysis/ intubated  STUDIES:  7/14 CTA PE >> 1. Bilateral airspace consolidation and confluent ground-glass densities relatively similar to the CT of 12/12/2017. Findings likely represent aspiration and ARDS given provided history of seizure. Clinical correlation is recommended. 2. No large pulmonary embolus. 3. Endotracheal tube above the carina and enteric tube in the Stomach.  CULTURES:  7/14 BCx2  > negative 7/14 MRSA PCR > neg   7/15 RVP > negative  7/15 trach asp > 7/15 BAL > neg 7/15 BAL CMV > neg prelim > 7/15 BAL viral  > neg prelim > 7/15 BAL cytology > negative  Antibiotics:  7/14 zosyn > 7/18  Discussion: 16 yr old male with PMHx significant for Sickle cell trait and Autism presents after found having a seizure that lasted 1 minute. He was confused post event and on presentation to the ED began having hemoptysis and SOB, intubated for airway protection. 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 He improved once again with steroids and was able to be extubated 7/17. Autoimmune serologic workup negative.   ASSESSMENT / PLAN:  Acute Hypoxic Respiratory Failure Hemoptysis-  Concern for DAH vs vasculitis/ autoimmune process, prev bronch in June with serial bloody aliquots; rapid improvement after steroids/ tx for aspiration; no etiology determined, autoimmune workup negative in June and negative again here this admission. Responding to steroids.  Pending   autoimmune panel C3/ C4 > normal ESR/ CRP  >  normal ANA >> neg ANCA titers>> neg ANCA antibodies >> neg Anti-phos lipid >> neg, wnl DS DNA > neg CCP antibody > wnl  Plan: -Prednisone started 7/18. Plan 50 mg x 4 days, then 40 mg x 4 days, then 30 mg -daily until follow up in pulmonary clinic on 8/1.  Seizure disorder - dx 12/2017 - CTH and MRI 12/2017 normal  Autism Plan: -Continue increased Keppra dose 750 mg BID through outpatient follow up in August per  neurology.   ? Aspiration pneumonia. Doubt. RVP negative. BAL so far negative. CMV and Viral still pending. Plan: -Continue to follow cultures.  -ABX off 7/18.   Georgann Housekeeper, AGACNP-BC [facility] Pulmonology/Critical Care Pager [phone] or [phone]  01/19/2018 11:23 AM

## 2018-01-19 NOTE — Progress Notes (Signed)
Pt being discharged home via wheelchair with family. Pt alert and oriented x4. VSS. Pt c/o no pain at this time. No signs of respiratory distress. Education complete and care plans resolved. IVx2 removed with catheter intact and pt tolerated well. No further issues at this time. Pt to follow up with PCP. Cayli Escajeda R, RN 

## 2018-01-19 NOTE — Progress Notes (Signed)
Patient is up walking to the bathroom

## 2018-01-19 NOTE — Discharge Summary (Signed)
[facility] Discharge Summary  Patient name: Billy Ayala Medical record number: [id number] Date of birth: [birth year] Age: 17 y.o. Gender: male Date of Admission: 01/14/2018  Date of Discharge: 01/19/2018 Admitting Physician: Kandice Hams, MD  Primary Care Provider: Steve Rattler, DO Consultants: Pulmonology/Critical Care  Indication for Hospitalization: hemoptysis and SOB following seizure   Discharge Diagnoses/Problem List:  Patient Active Problem List   Diagnosis Date Noted  . History of ETT   . Hb-SS disease without crisis ([place])   . Hemoptysis 01/14/2018  . Endotracheally intubated   . Respiratory distress   . Seizure-like activity ([place]) 12/21/2017  . Respiratory failure ([place])   . Aspiration pneumonia of both lungs due to gastric secretions ([place])   . Obesity 08/25/2011  . Autism spectrum disorder 09/17/2007  . SICKLE CELL TRAIT 08/31/2006     Disposition: DC home  Discharge Condition: Stable  Discharge Exam:  General: Alert and cooperative and appears to be in no acute distress HEENT: Neck non-tender without lymphadenopathy, masses or thyromegaly Cardio: Normal A1 and S2, no S3 or S4. Rhythm and rate regular. No murmurs or rubs.   Pulm: Clear to auscultation bilaterally, no crackles, wheezing, or diminished breath sounds. Normal respiratory effort Abdomen: Bowel sounds normal. Abdomen soft and non-tender.  Extremities: No peripheral edema. Warm/ well perfused.  Strong radial and pedal pulses. Neuro: Cranial nerves grossly intact  Brief Hospital Course:  Billy Ayala is an 17 year old man with a medical history significant for autism spectrum disorder, recent onset of seizures and sickle cell trait who came into the hospital with hemoptysis following a seizure episode on 7/14.  He had recently been hospitalized for a similar episode.  Shortly following his arrival at the ED he is found with hemoptysis and shortness of breath at  which time critical care was called and he was intubated in order to protect his airway.  The differential for his hemoptysis and shortness of breath was most concerning for pneumonia versus DAH from possible autoimmune etiology.  For his pneumonia, he was treated with Zosyn from 7/14 to 7/19.  An autoimmune panel was sent to work up [facility], results of this panel have been unremarkable.  He was cared for in the ICU from 7/14 to 7/18.  On 7/15 a bronchoscopy a bronchoscopy with BAL was performed.  The bronchoscopy revealed diffuse bleeding throughout the lungs, labs on the BAL have been unremarkable to date.  On 7/17 he was extubated without complication and on 1/32 his care was transferred from ICU to floor. On 7/19 he completed his course of antibiotics and no longer required hospital level care.  Mr. Route did not experience any seizure like activity during his time in the hospital.  On 7/19 he was discharged home with close follow-up with pulmonology, neurology and his PCP.  Blood cultures and cultures of BAL have been unremarkable to date.  Issues for Follow Up:  1. Follow up with pulmonology regarding Diffuse Alveolar Hemorrhage further work up and prevention of future episodes. Appointment with Dr. Vaughan Browner on 8/1. 2. Follow up with Neuro regarding appropriate diagnosis and treatment of recent onset seizure disorder. Keppra was increased during hospitalization from 500 mg to 750 mg.  Appointment with Dr. Leta Baptist on 8//27/2019 3. Follow up with PCP regarding further coordination of care and blood glucose management on current steroid regimen.  Appointment with Dr. Vanetta Shawl on 02/21/2018.  Significant Procedures:  7/14-7/17 - intubated 7/15 - bronchoscopy with BAL  Significant Labs and  Imaging:  C3/ C4: normal ESR/ TXM:IWOEHO  ANA:neg  ANCA titers:neg  ANCA antibodies:neg  Anti-phos lipid:neg / wnl Anti-DS DNA: neg CCP antibody:wnl  Scleroderma Scl-70: negative CMV: neg  Recent Labs   Lab 01/17/18 0334 01/18/18 0845 01/19/18 0619  WBC 16.3* 19.3* 12.8*  HGB 12.2* 14.0 14.3  HCT 38.1* 42.3 44.3  PLT 265 267 312   Recent Labs  Lab 01/14/18 1925  01/15/18 0213 01/16/18 0516 01/16/18 1054 01/16/18 1721 01/17/18 0334 01/18/18 0455 01/19/18 0619  NA 138  --  139 143  --   --  143 141 142  K 4.0  --  5.0 4.0  --   --  3.3* 3.9 3.5  CL 103  --  108 112*  --   --  109 101 101  CO2 26  --  23 21*  --   --  _0 GLUCOSE 111*  --  140* 180*  --   --  231* 112* 99  BUN 9  --  9 16  --   --  _1 CREATININE 1.08  --  1.13 1.03  --   --  0.99 0.84 0.85  CALCIUM 9.3  --  8.5* 8.7*  --   --  8.5* 8.7* 8.6*  MG  --    < > 2.1 1.9 2.1 2.1 2.1 2.2  --   PHOS  --    < > 3.2 2.6 4.6 3.7 1.2*  1.2* 4.5  --   ALKPHOS 92  --   --   --  63  --   --   --   --   AST 33  --   --   --  23  --   --   --   --   ALT 33  --   --   --  25  --   --   --   --   ALBUMIN 3.6  --   --  3.0* 2.9*  --  2.9*  --   --    < > = values in this interval not displayed.    Marland Kitchen  Results/Tests Pending at Time of Discharge:  Results for orders placed or performed during the hospital encounter of 01/14/18  Blood Culture (routine x 2)     Status: None   Collection Time: 01/14/18  7:55 PM  Result Value Ref Range Status   Specimen Description BLOOD LEFT ANTECUBITAL  Final   Special Requests   Final    BOTTLES DRAWN AEROBIC AND ANAEROBIC Blood Culture adequate volume   Culture   Final    NO GROWTH 5 DAYS Performed at [facility], [address], [place], [place] [address]    Report Status 01/19/2018 FINAL  Final  Blood Culture (routine x 2)     Status: None   Collection Time: 01/14/18  8:12 PM  Result Value Ref Range Status   Specimen Description BLOOD RIGHT ANTECUBITAL  Final   Special Requests   Final    BOTTLES DRAWN AEROBIC AND ANAEROBIC Blood Culture adequate volume   Culture   Final    NO GROWTH 5 DAYS Performed at [facility], [place] [address] [place], [place]  [address]    Report Status 01/19/2018 FINAL  Final  MRSA PCR Screening     Status: None   Collection Time: 01/14/18 11:07 PM  Result Value Ref Range Status   MRSA by PCR NEGATIVE NEGATIVE  Final    Comment:        The GeneXpert MRSA Assay (FDA approved for NASAL specimens only), is one component of a comprehensive MRSA colonization surveillance program. It is not intended to diagnose MRSA infection nor to guide or monitor treatment for MRSA infections. Performed at [facility], [place] [address], [place], [place] [address]   Respiratory Panel by PCR     Status: None   Collection Time: 01/15/18  9:55 AM  Result Value Ref Range Status   Adenovirus NOT DETECTED NOT DETECTED Final   Coronavirus 229E NOT DETECTED NOT DETECTED Final   Coronavirus HKU1 NOT DETECTED NOT DETECTED Final   Coronavirus NL63 NOT DETECTED NOT DETECTED Final   Coronavirus OC43 NOT DETECTED NOT DETECTED Final   Metapneumovirus NOT DETECTED NOT DETECTED Final   Rhinovirus / Enterovirus NOT DETECTED NOT DETECTED Final   Influenza A NOT DETECTED NOT DETECTED Final   Influenza B NOT DETECTED NOT DETECTED Final   Parainfluenza Virus 1 NOT DETECTED NOT DETECTED Final   Parainfluenza Virus 2 NOT DETECTED NOT DETECTED Final   Parainfluenza Virus 3 NOT DETECTED NOT DETECTED Final   Parainfluenza Virus 4 NOT DETECTED NOT DETECTED Final   Respiratory Syncytial Virus NOT DETECTED NOT DETECTED Final   Bordetella pertussis NOT DETECTED NOT DETECTED Final   Chlamydophila pneumoniae NOT DETECTED NOT DETECTED Final   Mycoplasma pneumoniae NOT DETECTED NOT DETECTED Final  Cytomegalovirus (CMV) Culture     Status: None   Collection Time: 01/15/18  1:30 PM  Result Value Ref Range Status   Cytomegalovirus (CMV) Culture Comment  Final    Comment: (NOTE) Preliminary Report: No Cytomegalovirus isolated at 24 hours. Next report to follow after 1 week. Performed At: [facility] [place], [place]  [id number] Rush Farmer MD VF:[id number]    Source of Sample BRONCHIAL ALVEOLAR LAVAGE  Final    Comment: Performed at [facility], [place] [address], [place], [place] [address]  Culture, bal-quantitative     Status: None   Collection Time: 01/15/18  1:32 PM  Result Value Ref Range Status   Specimen Description BRONCHIAL ALVEOLAR LAVAGE  Final   Special Requests NONE  Final   Gram Stain   Final    ABUNDANT WBC PRESENT,BOTH PMN AND MONONUCLEAR NO ORGANISMS SEEN    Culture   Final    NO GROWTH 2 DAYS Performed at [facility], [address], [place], [place] [address]    Report Status 01/17/2018 FINAL  Final  Virus culture     Status: None   Collection Time: 01/15/18  1:34 PM  Result Value Ref Range Status   Viral Culture Comment  Final    Comment: (NOTE) Preliminary Report: No virus isolated at 4 days.  Next report to follow after 7 days. Performed At: [facility] [place], [place] [id number] Rush Farmer MD DD:[id number]    Source of Sample [place]  Final     Discharge Medications:  Allergies as of 01/19/2018   No Known Allergies     Medication List    TAKE these medications   levETIRAcetam 750 MG tablet Commonly known as:  KEPPRA Take 1 tablet (750 mg total) by mouth 2 (two) times daily. What changed:    medication strength  how much to take   predniSONE 10 MG tablet Commonly known as:  DELTASONE Take 5 tablets (50 mg total) by mouth daily with breakfast for 8 days.  Starting 7/20: For 2 days, take 5 pills (50 mg) once a day For the next 4 days, take 4 pills (40 mg) once a day For the next 4 days, take 3 pills (30 mg) once a day Start taking on:  01/20/2018       Discharge Instructions: Please refer to Patient Instructions section of EMR for full details.  Patient was counseled important signs and symptoms that should prompt return to medical care, changes in medications, dietary instructions, activity  restrictions, and follow up appointments.   Follow-Up Appointments: Follow-up Information    Marshell Garfinkel, MD Follow up on 02/01/2018.   Specialty:  Pulmonary Disease Why:  9:00 AM [facility] 2nd [facility].  Contact information: [address] [place] [address] [phone]           Matilde Haymaker, MD 01/19/2018, 1:56 PM PGY-1, [email or web address]

## 2018-01-23 LAB — CYTOMEGALOVIRUS (CMV) CULTURE - CMVCUL

## 2018-01-23 LAB — VIRUS CULTURE

## 2018-02-01 ENCOUNTER — Encounter: Payer: Self-pay | Admitting: Pulmonary Disease

## 2018-02-01 ENCOUNTER — Ambulatory Visit (INDEPENDENT_AMBULATORY_CARE_PROVIDER_SITE_OTHER): Payer: Medicaid Other | Admitting: Pulmonary Disease

## 2018-02-01 ENCOUNTER — Ambulatory Visit (INDEPENDENT_AMBULATORY_CARE_PROVIDER_SITE_OTHER)
Admission: RE | Admit: 2018-02-01 | Discharge: 2018-02-01 | Disposition: A | Discharge status: Home / Self Care | Payer: Medicaid Other | Source: Ambulatory Visit | Attending: Pulmonary Disease | Admitting: Pulmonary Disease

## 2018-02-01 ENCOUNTER — Telehealth: Payer: Self-pay | Admitting: Pulmonary Disease

## 2018-02-01 VITALS — BP 120/74 | HR 114 | Ht 74.0 in | Wt 218.4 lb

## 2018-02-01 DIAGNOSIS — J849 Interstitial pulmonary disease, unspecified: Secondary | ICD-10-CM

## 2018-02-01 DIAGNOSIS — R042 Hemoptysis: Secondary | ICD-10-CM

## 2018-02-01 NOTE — Progress Notes (Deleted)
Billy Ayala    [id number]    2001-05-26  Primary Care Physician:Riccio, Marcell Anger, DO  Referring Physician: Tillman Sers, DO [address] [place], [place] [address]  Chief complaint:  ***  HPI:  ***  Pets: Occupation: Exposures: Smoking history: Travel history: Relevant family history:  Outpatient Encounter Medications as of 02/01/2018  Medication Sig  . levETIRAcetam (KEPPRA) 750 MG tablet Take 1 tablet (750 mg total) by mouth 2 (two) times daily.  . predniSONE (DELTASONE) 10 MG tablet Take 3 tablets (30 mg total) by mouth daily with breakfast.   No facility-administered encounter medications on file as of 02/01/2018.     Allergies as of 02/01/2018  . (No Known Allergies)    Past Medical History:  Diagnosis Date  . Autism   . Sickle cell trait (HCC)     No past surgical history on file.  Family History  Problem Relation Age of Onset  . Asthma Mother   . Hypertension Mother   . Asthma Sister   . Early death Neg Hx     Social History   Socioeconomic History  . Marital status: Single    Spouse name: Not on file  . Number of children: Not on file  . Years of education: Not on file  . Highest education level: Not on file  Occupational History  . Not on file  Social Needs  . Financial resource strain: Not on file  . Food insecurity:    Worry: Not on file    Inability: Not on file  . Transportation needs:    Medical: Not on file    Non-medical: Not on file  Tobacco Use  . Smoking status: Never Smoker  . Smokeless tobacco: Never Used  Substance and Sexual Activity  . Alcohol use: Not on file  . Drug use: Not on file  . Sexual activity: Not on file  Lifestyle  . Physical activity:    Days per week: Not on file    Minutes per session: Not on file  . Stress: Not on file  Relationships  . Social connections:    Talks on phone: Not on file    Gets together: Not on file    Attends religious service: Not on file    Active member  of club or organization: Not on file    Attends meetings of clubs or organizations: Not on file    Relationship status: Not on file  . Intimate partner violence:    Fear of current or ex partner: Not on file    Emotionally abused: Not on file    Physically abused: Not on file    Forced sexual activity: Not on file  Other Topics Concern  . Not on file  Social History Narrative   Lives with mom, three half brothers and boyfriend      Goes to [employer] full school days (6th grader)    Review of systems: Review of Systems  Constitutional: Negative for fever and chills.  HENT: Negative.   Eyes: Negative for blurred vision.  Respiratory: as per HPI  Cardiovascular: Negative for chest pain and palpitations.  Gastrointestinal: Negative for vomiting, diarrhea, blood per rectum. Genitourinary: Negative for dysuria, urgency, frequency and hematuria.  Musculoskeletal: Negative for myalgias, back pain and joint pain.  Skin: Negative for itching and rash.  Neurological: Negative for dizziness, tremors, focal weakness, seizures and loss of consciousness.  Endo/Heme/Allergies: Negative for environmental allergies.  Psychiatric/Behavioral: Negative  for depression, suicidal ideas and hallucinations.  All other systems reviewed and are negative.  Physical Exam: Blood pressure 120/74, pulse (!) 114, height 6\' 2"  (1.88 m), weight 218 lb 6.4 oz (99.1 kg), SpO2 98 %. Gen:      No acute distress HEENT:  EOMI, sclera anicteric Neck:     No masses; no thyromegaly Lungs:    Clear to auscultation bilaterally; normal respiratory effort*** CV:         Regular rate and rhythm; no murmurs Abd:      + bowel sounds; soft, non-tender; no palpable masses, no distension Ext:    No edema; adequate peripheral perfusion Skin:      Warm and dry; no rash Neuro: alert and oriented x 3 Psych: normal mood and affect  Data Reviewed: ***  Assessment:  ***  Plan/Recommendations: ***   Chilton GreathousePraveen Francetta Ilg MD   Pulmonary and Critical Care 02/01/2018, 9:10 AM  CC: Tillman Sersiccio, Angela C, DO

## 2018-02-01 NOTE — Patient Instructions (Signed)
We will start reducing the prednisone. Reduce to 20 mg daily for 2 weeks and then 10 mg daily for 2 weeks and then stop altogether I will see back in clinic in 2 months for reassessment with labs and a repeat CT If there is any recurrence of hemoptysis then let us know as soon as possible.

## 2018-02-01 NOTE — Progress Notes (Addendum)
Billy Ayala    [id number]    09/27/2000  Primary Care Physician:Riccio, Gardiner Rhyme, DO  Referring Physician: Steve Rattler, DO [address] [place], [place] [address]  Chief complaint:  Hemoptysis  HPI: Mr. Odriscoll is a 17yo M w/ PMH of autism and sickle cell trait and possible seizure disorder presenting to clinic for follow up after recent hospitalization for seizure like activity. This is his second seizure-like activity in recent months and shortly after arrival, he was found to have hemoptysis and shortness of breath and was intubated. He was presumed to have aspiration pneumonia and treated empirically with Zosyn. CT showed brown glass opacities bilaterally and consolidation at bases. He underwent bronchoscopy w/ BAL on 7/15 which showed diffuse bleeding throughout the lungs. His symptoms improved with steroids and antibiotics and he was discharged on 7/18 after 4 day stay in ICU. He did not have any seizure-like activity during his admission and he was discharged with prednisone 53m. His labs during his admission for autoimmune work up for interstitial lung disease came out negative. No abnormal organisms found on sputum culture.   Since discharge, he has been taking his Keppra and prenisone as prescribed. No additional episodes of seizure-like activity. Denies any new episodes of dyspnea or hemoptysis. No fevers, chills, nausea, vomiting, shortness of breath or palpitations  Pets: None Occupation: Student Exposures: None Smoking history: None Travel history: None Relevant family history: None  Outpatient Encounter Medications as of 02/01/2018  Medication Sig  . levETIRAcetam (KEPPRA) 750 MG tablet Take 1 tablet (750 mg total) by mouth 2 (two) times daily.  . predniSONE (DELTASONE) 10 MG tablet Take 3 tablets (30 mg total) by mouth daily with breakfast.   No facility-administered encounter medications on file as of 02/01/2018.     Allergies as of 02/01/2018  .  (No Known Allergies)    Past Medical History:  Diagnosis Date  . Autism   . Sickle cell trait ([facility]     No past surgical history on file.  Family History  Problem Relation Age of Onset  . Asthma Mother   . Hypertension Mother   . Asthma Sister   . Early death Neg Hx     Social History   Socioeconomic History  . Marital status: Single    Spouse name: Not on file  . Number of children: Not on file  . Years of education: Not on file  . Highest education level: Not on file  Occupational History  . Not on file  Social Needs  . Financial resource strain: Not on file  . Food insecurity:    Worry: Not on file    Inability: Not on file  . Transportation needs:    Medical: Not on file    Non-medical: Not on file  Tobacco Use  . Smoking status: Never Smoker  . Smokeless tobacco: Never Used  Substance and Sexual Activity  . Alcohol use: Not on file  . Drug use: Not on file  . Sexual activity: Not on file  Lifestyle  . Physical activity:    Days per week: Not on file    Minutes per session: Not on file  . Stress: Not on file  Relationships  . Social connections:    Talks on phone: Not on file    Gets together: Not on file    Attends religious service: Not on file    Active member of club or organization: Not on  file    Attends meetings of clubs or organizations: Not on file    Relationship status: Not on file  . Intimate partner violence:    Fear of current or ex partner: Not on file    Emotionally abused: Not on file    Physically abused: Not on file    Forced sexual activity: Not on file  Other Topics Concern  . Not on file  Social History Narrative   Lives with mom, three half brothers and boyfriend      Goes to [employer] full school days (6th grader)    Review of systems: Review of Systems  Constitutional: Negative for fever and chills.  HENT: Negative.   Eyes: Negative for blurred vision.  Respiratory: as per HPI  Cardiovascular: Negative for chest  pain and palpitations.  Gastrointestinal: Negative for vomiting, diarrhea, blood per rectum. Genitourinary: Negative for dysuria, urgency, frequency and hematuria.  Musculoskeletal: Negative for myalgias, back pain and joint pain.  Skin: Negative for itching and rash.  Neurological: Negative for dizziness, tremors, focal weakness, seizures and loss of consciousness.  Endo/Heme/Allergies: Negative for environmental allergies.  Psychiatric/Behavioral: Negative for depression, suicidal ideas and hallucinations.  All other systems reviewed and are negative.  Physical Exam: Blood pressure 120/74, pulse (!) 114, height _0  (1.88 m), weight 218 lb 6.4 oz (99.1 kg), SpO2 98 %. Gen:      No acute distress HEENT:  EOMI, sclera anicteric Neck:     No masses; no thyromegaly Lungs:    Clear to auscultation bilaterally; normal respiratory effort, No wheeze, no rales. CV:         Regular rate and rhythm; no murmurs Abd:      + bowel sounds; soft, non-tender; no palpable masses, no distension Ext:    No edema; adequate peripheral perfusion, No Raynauds Skin:      Warm and dry; no rash Neuro: alert and oriented x 3 Psych: normal mood and affect  Data Reviewed: CT angio 12/08/2017-no pulmonary embolism, diffuse bilateral groundglass opacities in the upper lobe and lower lobe consolidation CT angio 01/14/2018- no pulmonary embolism, redemonstration of diffuse bilateral groundglass opacities, bilateral consolidations. I have reviewed the images personally.  Bronch 12/13/2017, 01/15/2018 Path-negative  Micro 7/14 BCx2  > negative 7/14 MRSA PCR > neg            7/15 RVP > negative  7/15 trach asp > 7/15 BAL > neg 7/15 BAL CMV > neg  7/15 BAL viral  > neg 7/15 BAL cytology > negative  Labs 12/13/2017 ANA, ANCA, ACE, GBM-negative  01/14/2018 ANA, ANCA, ACE, CCP, double-stranded DNA, ANCA, complement, Scl 70-negative  Echocardiogram 12/12/17 LVEF 60 to 65%, mild RV dilation.  RV systolic function  is normal PA peak pressure 37.  No significant valvular abnormalities.  Assessment:  Dyspnea and Hemoptysis 2/2 aspiration pneumonia vs auto-immune interstitial lung disease - Patient's symptoms improved with prednisone and antibiotics but auto-immune panel were negative while on steroids and BAL culture were negative for pathogenic organisms.  Plan/Recommendations:  - Begin Prednisone taper: 53m starting tomorrow for 2 weeks and then 144mafter that for additional 2 weeks - Repeat CT chest after prednisone course is completed - Repeat X-ray today - F/u in 6 weeks  JoGilberto BetterPGY1  02/01/2018, 9:51 AM   Attending note: I have seen and examined the patient. History, labs and imaging reviewed.  [age]ear old with recurrent respiratory failure, hemoptysis, bilateral infiltrates, consolidation in the setting of possible seizures, aspiration.  He has  responded to prednisone at both times raising the question of autoimmune pneumonitis although serologies in the past were negative.  In clinic today he appears asymptomatic with no more recurrences of altered mental status, seizures, hemoptysis.  He has follow-up with neurology later this month.  Blood pressure 120/74, pulse (!) 114, height _0  (1.88 m), weight 218 lb 6.4 oz (99.1 kg), SpO2 98 %. Gen:      No acute distress HEENT:  EOMI, sclera anicteric Neck:     No masses; no thyromegaly Lungs:    Clear to auscultation bilaterally; normal respiratory effort CV:         Regular rate and rhythm; no murmurs Abd:      + bowel sounds; soft, non-tender; no palpable masses, no distension Ext:    No edema; adequate peripheral perfusion Skin:      Warm and dry; no rash Neuro: alert and oriented x 3 Psych: normal mood and affect   Assessment/Plan: 17 year old with recurrent episodes of respiratory failure, lung hemorrhage, bilateral infiltrates of unclear etiology. Can be ARDS, aspiration in the setting of altered mental status, seizures There  are no relevant history of exposures or symptoms of connective tissue disease  Rule out autoimmune pneumonitis. Get CXR today He is currently on prednisone at 30 mg We will start slow taper by 10 mg every 2 weeks. Repeat autoimmune serologies, high-res CT once he is off steroids If there are persistent abnormalities on imaging or if serologies turn positive then he will need a surgical lung biopsy  Discussed this in detail with patient, his mother and grandmother.  Marshell Garfinkel MD [facility]. 02/01/2018, 4:16 PM  CC: Steve Rattler, DO

## 2018-02-16 ENCOUNTER — Other Ambulatory Visit: Payer: Self-pay | Admitting: *Deleted

## 2018-02-16 MED ORDER — LEVETIRACETAM 750 MG PO TABS: 750.0000 mg | ORAL_TABLET | Freq: Two times a day (BID) | ORAL | 3 refills | Status: DC

## 2018-02-16 NOTE — Telephone Encounter (Signed)
Pt mom informed. Fleeger, Maryjo RochesterJessica Dawn, CMA

## 2018-02-16 NOTE — Telephone Encounter (Signed)
Pt will run out tomorrow.   Billy Ayala, Billy RochesterJessica Ayala, CMA

## 2018-02-21 ENCOUNTER — Ambulatory Visit (INDEPENDENT_AMBULATORY_CARE_PROVIDER_SITE_OTHER): Payer: Medicaid Other | Admitting: Family Medicine

## 2018-02-21 ENCOUNTER — Other Ambulatory Visit: Payer: Self-pay

## 2018-02-21 ENCOUNTER — Encounter: Payer: Self-pay | Admitting: Family Medicine

## 2018-02-21 VITALS — BP 110/80 | HR 90 | Temp 98.6°F | Wt 224.0 lb

## 2018-02-21 DIAGNOSIS — Z09 Encounter for follow-up examination after completed treatment for conditions other than malignant neoplasm: Secondary | ICD-10-CM | POA: Diagnosis present

## 2018-02-21 NOTE — Progress Notes (Signed)
    Subjective:    Patient ID: Billy Ayala, male    DOB: [birth year], 17 y.o.   MRN: [id number]   CC: follow up hospitalization  Patient here with mom. Reports he is doing well today.  Hospitalized mid-July for another seizure like episode with diffuse alveolar hemorrhage. Has been on keppra and prednisone taper per pulm. Plan per pulm is to taper off steroids then repeat chest CT and serologies for autoimmune cause of hemoptysis. There is a CT scan ordered for October 1st by Dr. Isaiah SergeMannam. He has an upcoming appointment with neurology next week. Mother reports they have decided to send him to school, he will have adequate supervision there and he enjoys going. She has placed some restrictions on him especially with getting overheated. She is happy as he seems to be feeling well. She is giving him H2 blocker twice a day to help with any stomach irritation that may occur with the steroid use.   Smoking status reviewed- non-smoker  Review of Systems- no cough, no hemoptysis, increased appetite, normal urine and stool output   Objective:  BP 110/80   Pulse 90   Temp 98.6 F (37 C) (Oral)   Wt 224 lb (101.6 kg)   SpO2 99%   BMI 28.76 kg/m  Vitals and nursing note reviewed  General: well nourished, in no acute distress HEENT: normocephalic, MMM Cardiac: RRR, clear S1 and S2, no murmurs, rubs, or gallops Respiratory: clear to auscultation bilaterally, no increased work of breathing Extremities: no edema or cyanosis Skin: warm and dry, no rashes noted Neuro: alert and interactive, no focal deficits   Assessment & Plan:   1. Hospital discharge follow-up Billy Ayala appears to be doing well. He is on steroid taper with plan to repeat chest CT and serologies per pulm. He sees Neurology next week. Mom has no concerns and Billy Ayala is acting like his happy normal self. Will have them follow up in 2-3 months to check in, otherwise asked mom to call with any needs or questions. Erminio's mother  indicates understanding of these issues and agrees with the plan.  Return in about 3 months (around 05/24/2018).   Dolores PattyAngela Akhilesh Sassone, DO Family Medicine Resident PGY-3

## 2018-02-21 NOTE — Patient Instructions (Signed)
   It was great seeing you today! Please let me know if I can help in any way. Keep going with the current medications. If you have questions or concerns please do not hesitate to call at [phone].  Dolores PattyAngela Laren Whaling, DO PGY-3, [facility] 02/21/2018 2:15 PM

## 2018-02-27 ENCOUNTER — Ambulatory Visit (INDEPENDENT_AMBULATORY_CARE_PROVIDER_SITE_OTHER): Payer: Medicaid Other | Admitting: Diagnostic Neuroimaging

## 2018-02-27 ENCOUNTER — Encounter

## 2018-02-27 ENCOUNTER — Encounter: Payer: Self-pay | Admitting: Diagnostic Neuroimaging

## 2018-02-27 VITALS — BP 132/86 | HR 89 | Ht 74.0 in | Wt 217.0 lb

## 2018-02-27 DIAGNOSIS — R042 Hemoptysis: Secondary | ICD-10-CM

## 2018-02-27 DIAGNOSIS — G40909 Epilepsy, unspecified, not intractable, without status epilepticus: Secondary | ICD-10-CM | POA: Diagnosis not present

## 2018-02-27 DIAGNOSIS — F840 Autistic disorder: Secondary | ICD-10-CM | POA: Diagnosis not present

## 2018-02-27 DIAGNOSIS — R0489 Hemorrhage from other sites in respiratory passages: Secondary | ICD-10-CM

## 2018-02-27 NOTE — Progress Notes (Signed)
[facility]  PATIENT: Billy Ayala DOB: 2000-08-02  REFERRING CLINICIAN: A Riccio, DO HISTORY FROM: mother, grandmother, chart review, patient REASON FOR VISIT: new consult    HISTORICAL  CHIEF COMPLAINT:  Chief Complaint  Patient presents with  . New Patient (Initial Visit)    Room 7 with mom and grandmother   . Possible Seizures Activity    Here to determine if patient is having seizures. Grandmother states patient has been to the hospital (last visit 01/14/2018) but tests have came back negative for seizures     HISTORY OF PRESENT ILLNESS:   17 year old male here for evaluation of seizures.  History of autism.  June 2019 patient was at home, collapsed and had generalized convulsions.  Patient was taken to the emergency room and then began to cough and vomit blood.  Patient was also diagnosed with diffuse alveolar hemorrhage.  He was started on antiseizure medication.  No specific etiology for hemoptysis was found.  July 2019 patient returned to the hospital for another seizure.  Again patient began to have pulmonary edema and hemoptysis.  Patient was immediately started on steroids and antiseizure medication dose was increased.  Since that time patient has been doing well.  No further seizures or hemoptysis.  He has been following up with PCP and pulmonary clinic.  He has additional pulmonary testing scheduled for October 2019.  No prior history of seizures.  There is family history of seizure in patient's father and paternal aunt.  No side effects of antiseizure medication at this time.   REVIEW OF SYSTEMS: Full 14 system review of systems performed and negative with exception of: As per HPI.  ALLERGIES: No Known Allergies  HOME MEDICATIONS: Outpatient Medications Prior to Visit  Medication Sig Dispense Refill  . levETIRAcetam (KEPPRA) 750 MG tablet Take 1 tablet (750 mg total) by mouth 2 (two) times daily. 180 tablet 3  . predniSONE  (DELTASONE) 10 MG tablet Take 10 mg by mouth daily with breakfast.     No facility-administered medications prior to visit.     PAST MEDICAL HISTORY: Past Medical History:  Diagnosis Date  . Autism   . Sickle cell trait (HCC)     PAST SURGICAL HISTORY: History reviewed. No pertinent surgical history.  FAMILY HISTORY: Family History  Problem Relation Age of Onset  . Asthma Mother   . Hypertension Father   . Sickle cell trait Father   . Bipolar disorder Father   . Asthma Sister   . Hypertension Maternal Grandmother   . Early death Neg Hx     SOCIAL HISTORY: Social History   Socioeconomic History  . Marital status: Single    Spouse name: Not on file  . Number of children: Not on file  . Years of education: Not on file  . Highest education level: Not on file  Occupational History  . Not on file  Social Needs  . Financial resource strain: Not on file  . Food insecurity:    Worry: Not on file    Inability: Not on file  . Transportation needs:    Medical: Not on file    Non-medical: Not on file  Tobacco Use  . Smoking status: Never Smoker  . Smokeless tobacco: Never Used  Substance and Sexual Activity  . Alcohol use: Never    Frequency: Never  . Drug use: Never  . Sexual activity: Not on file  Lifestyle  . Physical activity:    Days per week: Not on file  Minutes per session: Not on file  . Stress: Not on file  Relationships  . Social connections:    Talks on phone: Not on file    Gets together: Not on file    Attends religious service: Not on file    Active member of club or organization: Not on file    Attends meetings of clubs or organizations: Not on file    Relationship status: Not on file  . Intimate partner violence:    Fear of current or ex partner: Not on file    Emotionally abused: Not on file    Physically abused: Not on file    Forced sexual activity: Not on file  Other Topics Concern  . Not on file  Social History Narrative   Lives  with mom, three half brothers and boyfriend      Goes to [employer] full school days (6th grader)   Right handed    No caffeine      PHYSICAL EXAM  GENERAL EXAM/CONSTITUTIONAL: Vitals:  Vitals:   02/27/18 1452  BP: 132/86  Pulse: 89  Weight: 217 lb (98.4 kg)  Height: 6\' 2"  (1.88 m)     Body mass index is 27.86 kg/m. Wt Readings from Last 3 Encounters:  02/27/18 217 lb (98.4 kg) (97 %, Z= 1.86)*  02/21/18 224 lb (101.6 kg) (98 %, Z= 2.00)*  02/01/18 218 lb 6.4 oz (99.1 kg) (97 %, Z= 1.89)*   * Growth percentiles are based on CDC (Boys, 2-20 Years) data.     Patient is in no distress; well developed, nourished and groomed; neck is supple  CARDIOVASCULAR:  Examination of carotid arteries is normal; no carotid bruits  Regular rate and rhythm, no murmurs  Examination of peripheral vascular system by observation and palpation is normal  EYES:  Ophthalmoscopic exam of optic discs and posterior segments is normal; no papilledema or hemorrhages  No exam data present  MUSCULOSKELETAL:  Gait, strength, tone, movements noted in Neurologic exam below  NEUROLOGIC: MENTAL STATUS:  No flowsheet data found.  awake, alert, oriented to person  [facility] memory   normal attention and concentration  DECR FLUENCY; FOLLOW SIMPLE COMMANDS  fund of knowledge appropriate  CRANIAL NERVE:   2nd - no papilledema on fundoscopic exam  2nd, 3rd, 4th, 6th - pupils equal and reactive to light, visual fields full to confrontation, extraocular muscles intact, no nystagmus  5th - facial sensation symmetric  7th - facial strength symmetric  8th - hearing intact  9th - palate elevates symmetrically, uvula midline  11th - shoulder shrug symmetric  12th - tongue protrusion midline  MOTOR:   normal bulk and tone, full strength in the BUE, BLE  SENSORY:   normal and symmetric to light touch  COORDINATION:   finger-nose-finger, fine finger movements normal  REFLEXES:    deep tendon reflexes present and symmetric  GAIT/STATION:   narrow based gait     DIAGNOSTIC DATA (LABS, IMAGING, TESTING) - I reviewed patient records, labs, notes, testing and imaging myself where available.  Lab Results  Component Value Date   WBC 12.8 (H) 01/19/2018   HGB 14.3 01/19/2018   HCT 44.3 01/19/2018   MCV 76.2 (L) 01/19/2018   PLT 312 01/19/2018      Component Value Date/Time   NA 142 01/19/2018 0619   K 3.5 01/19/2018 0619   CL 101 01/19/2018 0619   CO2 30 01/19/2018 0619   GLUCOSE 99 01/19/2018 0619   BUN 12 01/19/2018 1610  CREATININE 0.85 01/19/2018 0619   CALCIUM 8.6 (L) 01/19/2018 0619   PROT 6.2 (L) 01/16/2018 1054   ALBUMIN 2.9 (L) 01/17/2018 0334   AST 23 01/16/2018 1054   ALT 25 01/16/2018 1054   ALKPHOS 63 01/16/2018 1054   BILITOT 1.1 01/16/2018 1054   GFRNONAA >60 01/19/2018 0619   GFRAA >60 01/19/2018 0619   Lab Results  Component Value Date   TRIG 184 (H) 01/14/2018   No results found for: HGBA1C Lab Results  Component Value Date   VITAMINB12 248 01/15/2018   Lab Results  Component Value Date   TSH 0.422 12/14/2017    12/13/17 EEG - This is a normal sleep/sedated EEG for the patients stated age.  There were no focal, hemispheric or lateralizing features.  No epileptiform activity was recorded.  Comment cannot be made on waking rhythm as the patient was sedated with Versed and Precedex.  A normal EEG does not exclude the diagnosis of a seizure disorder and if seizure remains high on the list of differential diagnosis, a repeat EEG when off of sedation may be of value.  Correlate clinically.  12/13/17 MRI brain [I reviewed images myself and agree with interpretation. -VRP]  - Unremarkable appearance of the brain.    ASSESSMENT AND PLAN  17 y.o. year old male here with autism and seizure x2.  At this point it is unclear whether patient had 2 seizures which in turn triggered pulmonary edema and hemoptysis or if patient has  underlying pulmonary disease (autoimmune) leading to hypoxia and seizure secondarily.  I would favor that patient had seizure as the initial event for both admissions; this is based on history per family.  Dx:  1. Autism   2. Seizure disorder (HCC)   3. Diffuse pulmonary alveolar hemorrhage     PLAN:  - continue levetiracetam 750mg  twice a day (refills per Dr. Wonda Oldsiccio for now; I would be glad to take over management of this medication if requested by PCP) - monitor symptoms for now  Return in about 6 months (around 08/30/2018).  I reviewed images, labs, notes, hospital records myself. I summarized findings and reviewed with patient and patient's family, for this high risk condition (seizure disorder) requiring high complexity decision making.    Suanne MarkerVIKRAM R. Thomasina Housley, MD 02/27/2018, 3:18 PM Certified in Neurology, Neurophysiology and Neuroimaging  [facility] [address], [address] [place], [place] [address] [phone]

## 2018-03-03 ENCOUNTER — Emergency Department (HOSPITAL_COMMUNITY)
Admission: EM | Admit: 2018-03-03 | Discharge: 2018-03-04 | Disposition: A | Discharge status: Home / Self Care | Payer: Medicaid Other | Attending: Emergency Medicine | Admitting: Emergency Medicine

## 2018-03-03 ENCOUNTER — Encounter (HOSPITAL_COMMUNITY): Payer: Self-pay | Admitting: Oncology

## 2018-03-03 ENCOUNTER — Emergency Department (HOSPITAL_COMMUNITY): Payer: Medicaid Other

## 2018-03-03 DIAGNOSIS — F840 Autistic disorder: Secondary | ICD-10-CM | POA: Insufficient documentation

## 2018-03-03 DIAGNOSIS — R569 Unspecified convulsions: Secondary | ICD-10-CM | POA: Diagnosis not present

## 2018-03-03 DIAGNOSIS — D573 Sickle-cell trait: Secondary | ICD-10-CM | POA: Diagnosis not present

## 2018-03-03 DIAGNOSIS — Z79899 Other long term (current) drug therapy: Secondary | ICD-10-CM | POA: Diagnosis not present

## 2018-03-03 LAB — CBC WITH DIFFERENTIAL/PLATELET
BASOS PCT: 1 %
Basophils Absolute: 0.1 10*3/uL (ref 0.0–0.1)
EOS PCT: 1 %
Eosinophils Absolute: 0.1 10*3/uL (ref 0.0–0.7)
HCT: 50 % (ref 39.0–52.0)
HEMOGLOBIN: 16.4 g/dL (ref 13.0–17.0)
LYMPHS PCT: 36 %
Lymphs Abs: 2.8 10*3/uL (ref 0.7–4.0)
MCH: 25.3 pg — AB (ref 26.0–34.0)
MCHC: 32.8 g/dL (ref 30.0–36.0)
MCV: 77 fL — AB (ref 78.0–100.0)
Monocytes Absolute: 0.7 10*3/uL (ref 0.1–1.0)
Monocytes Relative: 9 %
NEUTROS PCT: 53 %
Neutro Abs: 4 10*3/uL (ref 1.7–7.7)
Platelets: UNDETERMINED 10*3/uL (ref 150–400)
RBC: 6.49 MIL/uL — AB (ref 4.22–5.81)
RDW: 17.4 % — ABNORMAL HIGH (ref 11.5–15.5)
WBC: 7.7 10*3/uL (ref 4.0–10.5)

## 2018-03-03 LAB — BASIC METABOLIC PANEL
ANION GAP: 12 (ref 5–15)
BUN: 8 mg/dL (ref 6–20)
CHLORIDE: 101 mmol/L (ref 98–111)
CO2: 23 mmol/L (ref 22–32)
Calcium: 9.8 mg/dL (ref 8.9–10.3)
Creatinine, Ser: 1.01 mg/dL (ref 0.61–1.24)
GFR calc Af Amer: >60 mL/min (ref >60)
GLUCOSE: 97 mg/dL (ref 70–99)
Potassium: 4.6 mmol/L (ref 3.5–5.1)
SODIUM: 136 mmol/L (ref 135–145)

## 2018-03-03 LAB — I-STAT CG4 LACTIC ACID, ED: LACTIC ACID, VENOUS: 4.94 mmol/L — AB (ref 0.5–1.9)

## 2018-03-03 MED ORDER — SODIUM CHLORIDE 0.9 % IV BOLUS
1000.0000 mL | Freq: Once | INTRAVENOUS | Status: AC
Start: 2018-03-03 — End: 2018-03-04
  Administered 2018-03-03: 1000 mL via INTRAVENOUS

## 2018-03-03 MED ORDER — LEVETIRACETAM IN NACL 1000 MG/100ML IV SOLN
1000.0000 mg | Freq: Once | INTRAVENOUS | Status: AC
  Administered 2018-03-03: 1000 mg via INTRAVENOUS
  Filled 2018-03-03: qty 100

## 2018-03-03 NOTE — ED Triage Notes (Signed)
Pt transported from by EMS, per mom she described tonic clonic activity at home, + urinary incontinence.  Parents request no IV start enroute. Pt does take Keppra at home.  Sat on arrival 88%. Last ED visit pt intubated for ?pulmonary edema. Pt is nonverbal

## 2018-03-03 NOTE — ED Provider Notes (Signed)
[facility] Provider Note   CSN: [id number] Arrival date & time: 03/03/18  2159     History   Chief Complaint Chief Complaint  Patient presents with  . Seizures    HPI Sanders Q Ladona Ridgelaylor is a 17 y.o. male.  The history is provided by the patient and medical records.  Seizures      LEVEL V CAVEAT:  PATIENT NON-VERBAL  17 y.o. M with hx of autism and sickle cell trait, presenting to the ED following seizure.  History is obtained from mother as patient is mostly nonverbal at baseline.  Mom states he has been doing well recently, visited the neurologist recently and everything seemed to be going fine.  He did mention that if he had subsequent seizure would need to increase his Keppra again.  Mom states he did have both of his dose of Keppra today and she gave him some NyQuil to try to help him sleep as he has been very hyperactive lately, staying up until wee hours of the morning.  States she heard a "thud" which she suspects was his hand hitting the wall.  States she got up immediately and went to his room and found him on the floor.  States he did seem to be twitching still a little bit but very mild.  States this stopped after about 20 seconds or so. No bowel or bladder incontinence. States she rolled him on his side and he seemed to be drooling and having some frothy sputum.  Mom reports that he seemed to "come back to" quite rapidly, up and walking around a few minutes later.  She denies recent illness, fevers, or changes in medications.  On arrival, patient is afebrile, O2 sats were 85% on room air.  He is somewhat tachycardic.  Past Medical History:  Diagnosis Date  . Autism   . Sickle cell trait [facility](HCC)     Patient Active Problem List   Diagnosis Date Noted  . History of ETT   . Hb-SS disease without crisis (HCC)   . Hemoptysis 01/14/2018  . Seizure-like activity (HCC) 12/21/2017  . Obesity 08/25/2011  . Autism spectrum disorder  09/17/2007  . SICKLE CELL TRAIT 08/31/2006    History reviewed. No pertinent surgical history.      Home Medications    Prior to Admission medications   Medication Sig Start Date End Date Taking? Authorizing Provider  levETIRAcetam (KEPPRA) 750 MG tablet Take 1 tablet (750 mg total) by mouth 2 (two) times daily. 02/16/18   Tillman Sersiccio, Angela C, DO  predniSONE (DELTASONE) 10 MG tablet Take 10 mg by mouth daily with breakfast.    [provider]    Family History Family History  Problem Relation Age of Onset  . Asthma Mother   . Hypertension Father   . Sickle cell trait Father   . Bipolar disorder Father   . Asthma Sister   . Hypertension Maternal Grandmother   . Early death Neg Hx     Social History Social History   Tobacco Use  . Smoking status: Never Smoker  . Smokeless tobacco: Never Used  Substance Use Topics  . Alcohol use: Never    Frequency: Never  . Drug use: Never     Allergies   Patient has no known allergies.   Review of Systems Review of Systems  Unable to perform ROS: Patient nonverbal     Physical Exam Updated Vital Signs BP (!) 144/72 (BP Location: Right Arm)  Pulse (!) 135   Resp (!) 22   Ht 6\' 2"  (1.88 m)   Wt 98 kg   SpO2 (!) 88%   BMI 27.74 kg/m   Physical Exam  Constitutional: He appears well-developed and well-nourished.  Warm to the touch  HENT:  Head: Normocephalic and atraumatic.  Mouth/Throat: Oropharynx is clear and moist.  No tongue laceration, dentition appears intact  Eyes: Pupils are equal, round, and reactive to light. Conjunctivae and EOM are normal.  Neck: Normal range of motion.  Cardiovascular: Normal rate, regular rhythm and normal heart sounds.  Pulmonary/Chest: Effort normal and breath sounds normal. No stridor. No respiratory distress. He has no wheezes. He has no rhonchi.  O2 sats 98% on 2L nasal canula  Abdominal: Soft. Bowel sounds are normal. There is no tenderness. There is no rebound.    Musculoskeletal: Normal range of motion.  Neurological: He is alert.  Awake, oriented to surroundings, responsive during exam, answering some questions, following most commands during exam, moving arms and legs well At baseline per mom  Skin: Skin is warm and dry.  Psychiatric: His mood appears anxious.  Anxious during exam but able to be calmed with soothing voice and positive reinforcement  Nursing note and vitals reviewed.    ED Treatments / Results  Labs (all labs ordered are listed, but only abnormal results are displayed) Labs Reviewed  CBC WITH DIFFERENTIAL/PLATELET - Abnormal; Notable for the following components:      Result Value   RBC 6.49 (*)    MCV 77.0 (*)    MCH 25.3 (*)    RDW 17.4 (*)    All other components within normal limits  I-STAT CG4 LACTIC ACID, ED - Abnormal; Notable for the following components:   Lactic Acid, Venous 4.94 (*)    All other components within normal limits  I-STAT CG4 LACTIC ACID, ED - Abnormal; Notable for the following components:   Lactic Acid, Venous 1.97 (*)    All other components within normal limits  CULTURE, BLOOD (ROUTINE X 2)  CULTURE, BLOOD (ROUTINE X 2)  URINE CULTURE  BASIC METABOLIC PANEL    EKG EKG Interpretation  Date/Time:  Saturday March 03 2018 22:10:14 EDT Ventricular Rate:  125 PR Interval:    QRS Duration: 88 QT Interval:  302 QTC Calculation: 436 R Axis:   1 Text Interpretation:  Sinus tachycardia Confirmed by Virgina Norfolk [phone]) on 03/04/2018 12:52:13 AM   Radiology Dg Chest Port 1 View  Result Date: 03/03/2018 CLINICAL DATA:  17 year old male with hypoxia and seizures. EXAM: PORTABLE CHEST 1 VIEW COMPARISON:  Chest radiograph dated 02/01/2018 FINDINGS: There is shallow inspiration with minimal atelectatic changes of the lung bases. No focal consolidation, pleural effusion or pneumothorax. Top-normal cardiac size. No acute osseous pathology. IMPRESSION: No active disease. Electronically Signed    By: Elgie Collard M.D.   On: 03/03/2018 23:34    Procedures Procedures (including critical care time)  Medications Ordered in ED Medications  sodium chloride 0.9 % bolus 1,000 mL (0 mLs Intravenous Stopped 03/04/18 0043)  levETIRAcetam (KEPPRA) IVPB 1000 mg/100 mL premix (0 mg Intravenous Stopped 03/03/18 2328)     Initial Impression / Assessment and Plan / ED Course  I have reviewed the triage vital signs and the nursing notes.  Pertinent labs & imaging results that were available during my care of the patient were reviewed by me and considered in my medical decision making (see chart for details).  17 year old male presenting to the ED following  seizure.  Mother describes this as tonic-clonic but very brief.  Small postictal period, has returned to his baseline by time of arrival in the ED.  He is awake, alert, oriented to his baseline but remains largely nonverbal.  He is moving his arms and legs well without any noted ataxia.  No visible signs of head trauma.  No tongue laceration, dentition intact.  No evidence of bowel or bladder incontinence.  Was initially hypoxic on arrival, improved with 2 L supplemental O2.  Patient is very anxious but responds well to soothing voice and positive reinforcement.  Labs collected.  Will obtain chest x-ray as patient had history of pulmonary hemorrhage during last seizure requiring intubation.  Given IV fluids and 1 g IV Keppra.  Labs overall reassuring, initial lactate elevated at 4.94, repeat 1.97.  Suspect related to seizure.  CXR clear.  Patient HR steadily trended down in the ED, in the 80's on repeat exam.  He was weaned off O2, maintained appropriate saturations on room air.  Remains at neurologic baseline without repeat seizure activity.  Patient with breakthrough seizure today.  Mother reported based on recent discussions with neurologist, should he have recurrent seizure they would need to go up to 1,000 mg Keppra twice daily.  I have made this  adjustment for her temporarily, she understands to call neurologist on Tuesday to schedule follow-up appointment.  They will return here for any new/acute changes.  Final Clinical Impressions(s) / ED Diagnoses   Final diagnoses:  Seizure [facility])     Garlon Hatchet, PA-C 03/04/18 0554    Virgina Norfolk, DO 03/04/18 1117

## 2018-03-04 LAB — I-STAT CG4 LACTIC ACID, ED: LACTIC ACID, VENOUS: 1.97 mmol/L — AB (ref 0.5–1.9)

## 2018-03-04 MED ORDER — LEVETIRACETAM 1000 MG PO TABS: 1000.0000 mg | ORAL_TABLET | Freq: Two times a day (BID) | ORAL | 0 refills | Status: DC

## 2018-03-04 NOTE — Discharge Instructions (Signed)
Labs today looked good, chest x-ray was normal. Take the prescribed medication as directed. Follow-up with your neurologist-- call for appt. Return to the ED for new or worsening symptoms.

## 2018-03-06 ENCOUNTER — Telehealth: Payer: Self-pay | Admitting: Diagnostic Neuroimaging

## 2018-03-06 MED ORDER — LEVETIRACETAM 1000 MG PO TABS: 1000.0000 mg | ORAL_TABLET | Freq: Two times a day (BID) | ORAL | 12 refills | Status: DC

## 2018-03-06 NOTE — Telephone Encounter (Signed)
Spoke to mother of pt.  He had breakthru sz, did not last as long as his other sz. (she heard thud, then when in and on other jerk, then was up going to bathroom).  ED increased his keppra to 1000mg  po bid.  She stated he was sleepy next day but after that was fine.  At this time doing ok.  Needs refill of keppra 1000mg  po Bid if this is what you want to keep on. [facility] [address]/ [employer]

## 2018-03-06 NOTE — Telephone Encounter (Signed)
I relayed to mother that if keeps on keppra 1000mg  po bid, then will not call back unless change, Mother had verbalized understanding.

## 2018-03-06 NOTE — Telephone Encounter (Signed)
Meds ordered this encounter  Medications  . levETIRAcetam (KEPPRA) 1000 MG tablet    Sig: Take 1 tablet (1,000 mg total) by mouth 2 (two) times daily.    Dispense:  60 tablet    Refill:  12   Suanne Marker, MD 03/06/2018, 4:15 PM Certified in Neurology, Neurophysiology and Neuroimaging  [facility] [address], [address] [place], [place] [address] [phone]

## 2018-03-06 NOTE — Telephone Encounter (Signed)
Pt's mother Billy Ayala/DPR took pt to [facility] 8/31 for seizure. His keppra has been increased to 1000mg  twice daily. She said Dr Marjory Lies advised if the pt had a seizure to call. Please call to advise

## 2018-03-09 LAB — CULTURE, BLOOD (ROUTINE X 2)
CULTURE: NO GROWTH
CULTURE: NO GROWTH

## 2018-03-10 ENCOUNTER — Emergency Department (HOSPITAL_COMMUNITY): Payer: Medicaid Other

## 2018-03-10 ENCOUNTER — Inpatient Hospital Stay (HOSPITAL_COMMUNITY)
Admission: EM | Admit: 2018-03-10 | Discharge: 2018-03-12 | DRG: 101 | Disposition: A | Discharge status: Home / Self Care | Payer: Medicaid Other | Attending: Family Medicine | Admitting: Family Medicine

## 2018-03-10 ENCOUNTER — Encounter (HOSPITAL_COMMUNITY): Payer: Self-pay | Admitting: Emergency Medicine

## 2018-03-10 DIAGNOSIS — E876 Hypokalemia: Secondary | ICD-10-CM | POA: Diagnosis present

## 2018-03-10 DIAGNOSIS — F840 Autistic disorder: Secondary | ICD-10-CM | POA: Diagnosis present

## 2018-03-10 DIAGNOSIS — Z825 Family history of asthma and other chronic lower respiratory diseases: Secondary | ICD-10-CM | POA: Diagnosis not present

## 2018-03-10 DIAGNOSIS — Z8249 Family history of ischemic heart disease and other diseases of the circulatory system: Secondary | ICD-10-CM | POA: Diagnosis not present

## 2018-03-10 DIAGNOSIS — R0902 Hypoxemia: Secondary | ICD-10-CM

## 2018-03-10 DIAGNOSIS — D571 Sickle-cell disease without crisis: Secondary | ICD-10-CM | POA: Diagnosis present

## 2018-03-10 DIAGNOSIS — R569 Unspecified convulsions: Secondary | ICD-10-CM | POA: Diagnosis present

## 2018-03-10 DIAGNOSIS — G40909 Epilepsy, unspecified, not intractable, without status epilepticus: Secondary | ICD-10-CM | POA: Diagnosis present

## 2018-03-10 DIAGNOSIS — Z79899 Other long term (current) drug therapy: Secondary | ICD-10-CM

## 2018-03-10 DIAGNOSIS — Z23 Encounter for immunization: Secondary | ICD-10-CM

## 2018-03-10 DIAGNOSIS — E669 Obesity, unspecified: Secondary | ICD-10-CM | POA: Diagnosis present

## 2018-03-10 DIAGNOSIS — Z68.41 Body mass index (BMI) pediatric, 85th percentile to less than 95th percentile for age: Secondary | ICD-10-CM | POA: Diagnosis not present

## 2018-03-10 DIAGNOSIS — R000 Tachycardia, unspecified: Secondary | ICD-10-CM | POA: Diagnosis present

## 2018-03-10 DIAGNOSIS — Z818 Family history of other mental and behavioral disorders: Secondary | ICD-10-CM

## 2018-03-10 DIAGNOSIS — Z832 Family history of diseases of the blood and blood-forming organs and certain disorders involving the immune mechanism: Secondary | ICD-10-CM

## 2018-03-10 HISTORY — DX: Unspecified convulsions: R56.9

## 2018-03-10 LAB — CBC
HEMATOCRIT: 51.7 % (ref 39.0–52.0)
HEMOGLOBIN: 16.2 g/dL (ref 13.0–17.0)
MCH: 24.8 pg — ABNORMAL LOW (ref 26.0–34.0)
MCHC: 31.3 g/dL (ref 30.0–36.0)
MCV: 79.3 fL (ref 78.0–100.0)
Platelets: 361 10*3/uL (ref 150–400)
RBC: 6.52 MIL/uL — AB (ref 4.22–5.81)
RDW: 17.3 % — ABNORMAL HIGH (ref 11.5–15.5)
WBC: 8.3 10*3/uL (ref 4.0–10.5)

## 2018-03-10 LAB — BASIC METABOLIC PANEL
Anion gap: 12 (ref 5–15)
BUN: 7 mg/dL (ref 6–20)
CHLORIDE: 101 mmol/L (ref 98–111)
CO2: 26 mmol/L (ref 22–32)
Calcium: 9.5 mg/dL (ref 8.9–10.3)
Creatinine, Ser: 1.01 mg/dL (ref 0.61–1.24)
GFR calc Af Amer: >60 mL/min (ref >60)
GFR calc non Af Amer: >60 mL/min (ref >60)
Glucose, Bld: 103 mg/dL — ABNORMAL HIGH (ref 70–99)
POTASSIUM: 3.9 mmol/L (ref 3.5–5.1)
Sodium: 139 mmol/L (ref 135–145)

## 2018-03-10 MED ORDER — POLYETHYLENE GLYCOL 3350 17 G PO PACK
17.0000 g | PACK | Freq: Every day | ORAL | Status: DC | PRN
  Filled 2018-03-10: qty 1

## 2018-03-10 MED ORDER — ACETAMINOPHEN 650 MG RE SUPP: 650.0000 mg | Freq: Four times a day (QID) | RECTAL | Status: DC | PRN

## 2018-03-10 MED ORDER — LACTATED RINGERS IV SOLN
INTRAVENOUS | Status: AC
  Administered 2018-03-11 (×2): via INTRAVENOUS

## 2018-03-10 MED ORDER — LEVETIRACETAM IN NACL 1000 MG/100ML IV SOLN
1000.0000 mg | Freq: Once | INTRAVENOUS | Status: AC
  Administered 2018-03-10: 1000 mg via INTRAVENOUS
  Filled 2018-03-10: qty 100

## 2018-03-10 MED ORDER — ACETAMINOPHEN 325 MG PO TABS: 650.0000 mg | ORAL_TABLET | Freq: Four times a day (QID) | ORAL | Status: DC | PRN

## 2018-03-10 NOTE — H&P (Signed)
[facility] Admission History and Physical Service Pager: [phone]  Patient name: Billy Ayala Medical record number: [id number] Date of birth: 09-09-2000 Age: 17 y.o. Gender: male  Primary Care Provider: Steve Rattler, DO Consultants: Neurology (curbside) Code Status: Full  Chief Complaint: seizure  Assessment and Plan: Billy Ayala is a 17 y.o. male presenting with seizure and hypoxia . PMH is significant for autism, seizure disorder, h/o diffuse alveolar hemorrhage.  Seizure: Patient with 2 brief breakthrough seizures on keppra 1055m BID with no apparent trigger; he does have new pulmonary infiltrates which could be aspiration pneumonitis, pneumonia or alveolar hemorrhage (due to his history) and likely as a sequela of the seizures rather than preceding them seeing as patient was completely asymptomatic prior to presentation. Discussed with neurology - they recommend increasing to max dose keppra 15063mBID for now; if further breakthrough seizures on this regimen then would start depakote if normal hepatic function.   --s/p 100051mV keppra in ED; give additional 500m70mw then start 1500mg82m in the AM  --if further seizures on this regimen, then can start depakote if hepatic function wnl (add on hepatic function panel to admission labs) --FIY for dispo, ok for mom to crush up keppra IR pills; can also offer keppra solution if affordable (equivalent dosing)  Hypoxia: Patient found to be hypoxic (not recorded) in the ED following seizures, initially required 3LNC to maintain O2 sat >90%, now has been weaned to 1LNC [facility] saturations maintaining 93-96%. CXR, though inadequate inspiration, showed silhouette sign at left heart border and left lateral diaphragm consistent with infiltrate of lingula and LLL. Patient with h/o diffuse alveolar hemorrhage episodes following (or triggering?) seizures requiring intubation (June and July 2019). He has been  tapered off of prednisone with last dose being 8/31. Prior to presentation today, mom denies that patient has had a cough or shortness of breath; since being in the ED she notes he has coughed a couple of times but it is not productive and he has not had hemoptysis. Differential for left sided infiltrates in setting of seizures is aspiration pneumonitis, aspiration pneumonia, or recurrence of alveolar hemorrhage that is more localized.  --start Unasyn 3g q6hr for coverage of aspiration pneumonia --if hemoptysis occurs, will restart steroids and consult pulmonary --wean to RA as able (currently 1L [place]) --repeat CXR (PA and lateral) in the AM  FEN/GI: Reg diet; LR 100ml/65m10hr Prophylaxis: SCDs in setting of h/o of alveolar hemorrhage and current pulmonary infiltrate  Disposition: stepdown  History of Present Illness:  Billy Q TaylorCaselli16 y.o10male presenting with two episodes of seizures.   History obtained from mother and grandmother.   Patient had been in his normal state of health until ~7:20pm this evening when his mom found him on the floor of his room having what appeared to be a grand mal seizure. She reports his entire body including his face was stiff with jerking motions; he did not bite his tongue, and did not have bowel or bladder incontinence. She had been away from him less than a minute when she found him; episode lasted ~10sec and recurred shortly afterwards again. She endorses that he had some drooling, but did not have vomiting. She reports he did not have any confusion or weakness noted. Mom states that he almost immediately got up to sit on his bed, then walked to and used the restroom without issues. They then proceeded to come to the ED.  Mom denies recent fevers, chills, nausea, vomiting, abd pain, diarrhea, rash, congestion, headaches.   Patient with recent onset of seizures, no clear etiology at this time, which started in June 2019. He was admitted at this time due  to new seizure and hemoptysis and diagnosed with DAH. He had a similar presentation in July; both admissions required intubation. Work up for [facility] was negative for autoimmune cause though he was on steroids; sputum and BAL cultures were negative. He was treated with zosyn and e was discharged on prednisone which has been tapered off now. He has since followed up with neurology and had another ED visit for seizure; keppra was increased to 10110m BID. He has also followed up with pulmonology who repeated CXR (resolution of infiltrates) and plan on repeating autoimmune workup and high resolution CT in Oct.   Patient had last dose of prednisone 11mon 8/31; he also had a seizure at that time and was seen in the ED. His Keppra dose was increased to 100053mID at the time. Mom states that he has been taking the keppra without issues, usually at 7:30AM/PM, crushed up in applesauce.    On arrival to the ED patient was tachycardic, hypoxic which improved to >90 on 3L [place], normotensive and afebrile. CBC and Bmet were unremarkable. CXR reveals new left sided infiltrates.   Review Of Systems: Per HPI with the following additions:   Patient Active Problem List   Diagnosis Date Noted  . History of ETT   . [facility]-SS disease without crisis ([facility] . Hemoptysis 01/14/2018  . Seizure-like activity ([facility]6/20/2019  . Obesity 08/25/2011  . Autism spectrum disorder 09/17/2007  . SICKLE CELL TRAIT 08/31/2006    Past Medical History: Past Medical History:  Diagnosis Date  . Autism   . Seizure ([facility] . [place] ([place]   Past Surgical History: History reviewed. No pertinent surgical history.  Social History: Social History   Tobacco Use  . Smoking status: Never Smoker  . Smokeless tobacco: Never Used  Substance Use Topics  . Alcohol use: Never    Frequency: Never  . Drug use: Never   Additional social history: [employer] with mom. Please also refer to relevant sections of EMR.  Family  History: Family History  Problem Relation Age of Onset  . Asthma Mother   . Hypertension Father   . Sickle cell trait Father   . Bipolar disorder Father   . Asthma Sister   . Hypertension Maternal Grandmother   . Early death Neg Hx    Additionally, fathers side of the family has several members who have sarcoidosis  Allergies and Medications: No Known Allergies No current facility-administered medications on file prior to encounter.    Current Outpatient Medications on File Prior to Encounter  Medication Sig Dispense Refill  . levETIRAcetam (KEPPRA) 1000 MG tablet Take 1 tablet (1,000 mg total) by mouth 2 (two) times daily. 60 tablet 12  . predniSONE (DELTASONE) 10 MG tablet Take 10 mg by mouth daily with breakfast.      Objective: BP (!) 157/84 (BP Location: Right Arm)   Pulse (!) 106   Temp 99.9 F (37.7 C) (Oral)   Resp (!) 37   SpO2 98%  CONSTITUTIONAL: NAD, sleeping but arousable.  HEENT: normocephalic and atraumatic; PERRL, anicteric; moist mucous membranes, no oropharyngeal erythema or exudates; no lymphadenopathy; no tongue injury.   RESP: no increased work of breathing. Was not able to fully position him  to all posterior lung fields, but had left sided basilar rales; no wheezing. CV: tachycardia, no murmur, no rub, no gallop; pulses intact; no cyanosis, clubbing or edema. ABD: soft, NDNT, +BS.  MSK: moves all 4 extremities freely NEURO: CN grossly intact; strength intact throughout SKIN: normal turgor and no rashes.  PSYCH: not anxious appearing, and not depressed appearing.  Labs and Imaging: CBC BMET  Recent Labs  Lab 03/10/18 2015  WBC 8.3  HGB 16.2  HCT 51.7  PLT 361   Recent Labs  Lab 03/10/18 2015  NA 139  K 3.9  CL 101  CO2 26  BUN 7  CREATININE 1.01  GLUCOSE 103*  CALCIUM 9.5     CXR - personally reviewed - poor inspiratory effort; silhouetting of left lateral diaphragm and left heart border consistent with infiltrate in left lingula and  left lower lobe  EKG - personally reviewed - Sinus tachycardia; no ischemic TWI, ST elevations or depressions.  Alphonzo Grieve, MD 03/10/2018, 10:18 PM [place] Intern pager: [phone], text pages welcome

## 2018-03-10 NOTE — ED Provider Notes (Signed)
I saw and evaluated the patient, reviewed the resident's note and I agree with the findings and plan.  Pertinent History: The patient is an 16 year old male, history of autism, recent diagnosis of seizure disorder, his father has known seizures as well.  The patient is currently taking Keppra 1000 mg twice daily, he did not receive his evening dose prior to starting to have a seizure.  The mother reports that she went into his room and found him laying on his side having tonic-clonic like activity, this lasted approximately 10 to 15 seconds, resolved and then he had more seizures which also resolved spontaneously.  By the time the paramedics arrived the patient was no longer seizing, he was postictal with confusion, there was some frothing at the mouth but no vomiting, no tongue biting and no incontinence.  Pertinent Exam findings: On exam the patient is tachycardic to 130 bpm, normal pulses, no edema, no signs of trauma, no tongue trauma, he is awake alert, able to follow some simple commands, according to the mother his baseline is that he is able to feed himself, walk, clothe himself but does not speak very much and when he does speak he just repeats what you just said.  I was personally present and directly supervised the following procedures:  Medical resuscitation   Patient will need Keppra IV, will need to have this discussed with neurology as well as the patient had seen Dr. Marjory Lies this week.  Recommendations likely that he probably needs more antiseizure medications.  In fact the notes from February 27, 2018 suggest that the patient should be on 750 mg twice a day, the neurology office called back on 3 September asking to increase to 1000 mg twice a day.  I personally interpreted the EKG as well as the resident and agree with the interpretation on the resident's chart.  Final diagnoses:  Hypoxia  Seizures - recurrent    Eber Hong, MD 03/13/18 [phone]

## 2018-03-10 NOTE — ED Provider Notes (Signed)
[facility] EMERGENCY DEPARTMENT Provider Note   CSN: [id number] Arrival date & time: 03/10/18  2001     History   Chief Complaint Chief Complaint  Patient presents with  . Seizures    HPI Christoph Q Zarrella is a 17 y.o. male.  The history is provided by the EMS personnel and a parent. History limited by: patient is autistic and does not speak very much.  Seizures   This is a recurrent problem. The current episode started less than 1 hour ago. The problem has been resolved. There were 2 to 3 seizures. The most recent episode lasted less than 30 seconds. Associated symptoms include sleepiness, confusion and cough. Pertinent negatives include no chest pain. Characteristics include rhythmic jerking and loss of consciousness. The episode was witnessed. The seizures did not continue in the ED. The seizure(s) had no focality. Possible causes include medication or dosage change. Possible causes do not include missed seizure meds. There has been no fever. There were no medications administered prior to arrival.    Past Medical History:  Diagnosis Date  . Autism   . Seizure (HCC)   . Sickle cell trait [facility])     Patient Active Problem List   Diagnosis Date Noted  . Hypoxia   . Seizure (HCC) 03/10/2018  . History of ETT   . Hb-SS disease without crisis (HCC)   . Hemoptysis 01/14/2018  . Seizure-like activity (HCC) 12/21/2017  . Obesity 08/25/2011  . Autism spectrum disorder 09/17/2007  . SICKLE CELL TRAIT 08/31/2006    History reviewed. No pertinent surgical history.      Home Medications    Prior to Admission medications   Medication Sig Start Date End Date Taking? Authorizing Provider  levETIRAcetam (KEPPRA) 1000 MG tablet Take 1 tablet (1,000 mg total) by mouth 2 (two) times daily. 03/06/18  Yes Penumalli, Glenford Bayley, MD    Family History Family History  Problem Relation Age of Onset  . Asthma Mother   . Hypertension Father   . Sickle cell trait Father    . Bipolar disorder Father   . Asthma Sister   . Hypertension Maternal Grandmother   . Early death Neg Hx     Social History Social History   Tobacco Use  . Smoking status: Never Smoker  . Smokeless tobacco: Never Used  Substance Use Topics  . Alcohol use: Never    Frequency: Never  . Drug use: Never     Allergies   Patient has no known allergies.   Review of Systems Review of Systems  Eyes: Negative for pain.  Respiratory: Positive for cough.   Cardiovascular: Negative for chest pain.  Gastrointestinal: Negative for abdominal pain.  Genitourinary: Negative for flank pain.  Musculoskeletal: Negative for back pain.  Skin: Negative for wound.  Neurological: Positive for seizures and loss of consciousness.  Psychiatric/Behavioral: Positive for confusion.  All other systems reviewed and are negative.    Physical Exam Updated Vital Signs BP 125/80 (BP Location: Right Arm)   Pulse (!) 119   Temp 98.5 F (36.9 C) (Oral)   Resp (!) 22   SpO2 98%   Physical Exam  Constitutional: He appears well-developed and well-nourished.  HENT:  Head: Normocephalic and atraumatic.  Eyes: Conjunctivae are normal.  Neck: Neck supple.  Cardiovascular: Normal rate and regular rhythm.  No murmur heard. Pulmonary/Chest: Effort normal and breath sounds normal. No respiratory distress.  Abdominal: Soft. There is no tenderness.  Musculoskeletal: He exhibits no edema.  Neurological:  He is alert.  Skin: Skin is warm and dry.  Psychiatric: His speech is delayed and slurred. He is withdrawn. He is inattentive.  Nursing note and vitals reviewed.    ED Treatments / Results  Labs (all labs ordered are listed, but only abnormal results are displayed) Labs Reviewed  CBC - Abnormal; Notable for the following components:      Result Value   RBC 6.52 (*)    MCH 24.8 (*)    RDW 17.3 (*)    All other components within normal limits  BASIC METABOLIC PANEL - Abnormal; Notable for the  following components:   Glucose, Bld 103 (*)    All other components within normal limits  MAGNESIUM  HEPATIC FUNCTION PANEL  CBC  BASIC METABOLIC PANEL    EKG EKG Interpretation  Date/Time:  Saturday March 10 2018 20:04:06 EDT Ventricular Rate:  135 PR Interval:    QRS Duration: 85 QT Interval:  293 QTC Calculation: 440 R Axis:   -17 Text Interpretation:  Sinus tachycardia LVH by voltage since last tracing no significant change Confirmed by Eber Hong ([id number]) on 03/10/2018 8:07:22 PM   Radiology Dg Chest Portable 1 View  Result Date: 03/10/2018 CLINICAL DATA:  Hypoxia and seizure EXAM: PORTABLE CHEST 1 VIEW COMPARISON:  03/03/2018 FINDINGS: Lung volumes are low. There is crowding of interstitial lung markings similar to prior. New confluent pulmonary opacities are noted in the left mid and lower lung consistent with pneumonia. Heart is top-normal in size. No overt pulmonary edema. No aortic aneurysm. No acute osseous abnormality. IMPRESSION: New pulmonary opacities at the left lung base and left mid lung suspicious for new foci of pneumonia. Lung volumes are somewhat low with crowding of interstitial lung markings otherwise. Electronically Signed   By: Tollie Eth M.D.   On: 03/10/2018 21:29    Procedures Procedures (including critical care time)  Medications Ordered in ED Medications  lactated ringers infusion (has no administration in time range)  acetaminophen (TYLENOL) tablet 650 mg (has no administration in time range)    Or  acetaminophen (TYLENOL) suppository 650 mg (has no administration in time range)  polyethylene glycol (MIRALAX / GLYCOLAX) packet 17 g (has no administration in time range)  levETIRAcetam (KEPPRA) 100 MG/ML solution 1,500 mg (has no administration in time range)  levETIRAcetam (KEPPRA) IVPB 500 mg/100 mL premix (has no administration in time range)  Ampicillin-Sulbactam (UNASYN) 3 g in sodium chloride 0.9 % 100 mL IVPB (has no administration in  time range)  levETIRAcetam (KEPPRA) IVPB 1000 mg/100 mL premix (0 mg Intravenous Stopped 03/10/18 2137)     Initial Impression / Assessment and Plan / ED Course  I have reviewed the triage vital signs and the nursing notes.  Pertinent labs & imaging results that were available during my care of the patient were reviewed by me and considered in my medical decision making (see chart for details).     Patient is an 17 year old male with past medical history of autism, epilepsy, and use alveolar hemorrhage who presents with a seizure.  The patient had 2 seizures that lasted less than 10 seconds each and resolved without any medications.  The patient is now hypoxic requiring 4 L of oxygen to maintain sats above 90%.  The patient has returned to baseline per his mother.  The patient has infiltrates on his chest x-ray.  He has no leukocytosis or anemia.  Electrolytes within normal limits.  Due to discern for [facility], patient admitted to family medicine.  Patient care supervised by Dr. Hyacinth Meeker.  Nash Dimmer, MD  Final Clinical Impressions(s) / ED Diagnoses   Final diagnoses:  Hypoxia    ED Discharge Orders    None       Nash Dimmer, MD 03/11/18 1610    Eber Hong, MD 03/13/18 [phone]

## 2018-03-10 NOTE — ED Triage Notes (Signed)
Patient arrived with EMS from home reports 2 episodes of grand mal seizures today , no seizures at arrival / respirations unlabored , history of autism , no reported injuries from seizures.

## 2018-03-11 ENCOUNTER — Other Ambulatory Visit: Payer: Self-pay

## 2018-03-11 ENCOUNTER — Encounter (HOSPITAL_COMMUNITY): Payer: Self-pay

## 2018-03-11 ENCOUNTER — Inpatient Hospital Stay (HOSPITAL_COMMUNITY): Payer: Medicaid Other

## 2018-03-11 DIAGNOSIS — R0902 Hypoxemia: Secondary | ICD-10-CM | POA: Insufficient documentation

## 2018-03-11 LAB — HEPATIC FUNCTION PANEL
ALBUMIN: 3.9 g/dL (ref 3.5–5.0)
ALT: 33 U/L (ref 0–44)
AST: 38 U/L (ref 15–41)
Alkaline Phosphatase: 87 U/L (ref 38–126)
BILIRUBIN TOTAL: 0.6 mg/dL (ref 0.3–1.2)
Bilirubin, Direct: <0.1 mg/dL (ref 0.0–0.2)
TOTAL PROTEIN: 7.6 g/dL (ref 6.5–8.1)

## 2018-03-11 LAB — BASIC METABOLIC PANEL
Anion gap: 9 (ref 5–15)
BUN: 5 mg/dL — ABNORMAL LOW (ref 6–20)
CO2: 26 mmol/L (ref 22–32)
CREATININE: 0.86 mg/dL (ref 0.61–1.24)
Calcium: 8.9 mg/dL (ref 8.9–10.3)
Chloride: 104 mmol/L (ref 98–111)
Glucose, Bld: 103 mg/dL — ABNORMAL HIGH (ref 70–99)
Potassium: 3.4 mmol/L — ABNORMAL LOW (ref 3.5–5.1)
SODIUM: 139 mmol/L (ref 135–145)

## 2018-03-11 LAB — CBC
HCT: 43.8 % (ref 39.0–52.0)
Hemoglobin: 13.9 g/dL (ref 13.0–17.0)
MCH: 24.3 pg — ABNORMAL LOW (ref 26.0–34.0)
MCHC: 31.7 g/dL (ref 30.0–36.0)
MCV: 76.7 fL — ABNORMAL LOW (ref 78.0–100.0)
PLATELETS: 323 10*3/uL (ref 150–400)
RBC: 5.71 MIL/uL (ref 4.22–5.81)
RDW: 15.9 % — AB (ref 11.5–15.5)
WBC: 8.9 10*3/uL (ref 4.0–10.5)

## 2018-03-11 LAB — MAGNESIUM: MAGNESIUM: 2.4 mg/dL (ref 1.7–2.4)

## 2018-03-11 MED ORDER — SODIUM CHLORIDE 0.9 % IV SOLN
3.0000 g | Freq: Four times a day (QID) | INTRAVENOUS | Status: DC
  Administered 2018-03-11 (×2): 3 g via INTRAVENOUS
  Filled 2018-03-11 (×3): qty 3

## 2018-03-11 MED ORDER — LACTATED RINGERS IV BOLUS
1000.0000 mL | Freq: Once | INTRAVENOUS | Status: AC
  Administered 2018-03-11: 1000 mL via INTRAVENOUS

## 2018-03-11 MED ORDER — LEVETIRACETAM IN NACL 500 MG/100ML IV SOLN
500.0000 mg | Freq: Once | INTRAVENOUS | Status: AC
  Administered 2018-03-11: 500 mg via INTRAVENOUS
  Filled 2018-03-11: qty 100

## 2018-03-11 MED ORDER — LEVETIRACETAM 100 MG/ML PO SOLN: 1500.0000 mg | Freq: Two times a day (BID) | ORAL | Status: DC

## 2018-03-11 MED ORDER — FAMOTIDINE 10 MG PO TABS
10.0000 mg | ORAL_TABLET | Freq: Every day | ORAL | Status: DC
  Administered 2018-03-11 – 2018-03-12 (×2): 10 mg via ORAL
  Filled 2018-03-11 (×2): qty 1

## 2018-03-11 MED ORDER — INFLUENZA VAC SPLIT QUAD 0.5 ML IM SUSY
0.5000 mL | PREFILLED_SYRINGE | INTRAMUSCULAR | Status: AC
  Administered 2018-03-12: 0.5 mL via INTRAMUSCULAR
  Filled 2018-03-11: qty 0.5

## 2018-03-11 MED ORDER — LEVETIRACETAM 100 MG/ML PO SOLN
1500.0000 mg | Freq: Two times a day (BID) | ORAL | Status: DC
  Administered 2018-03-11: 1500 mg via ORAL
  Filled 2018-03-11: qty 15

## 2018-03-11 MED ORDER — LEVETIRACETAM 100 MG/ML PO SOLN
1500.0000 mg | Freq: Two times a day (BID) | ORAL | Status: DC
  Administered 2018-03-11 – 2018-03-12 (×2): 1500 mg via ORAL
  Filled 2018-03-11 (×3): qty 15

## 2018-03-11 MED ORDER — PNEUMOCOCCAL VAC POLYVALENT 25 MCG/0.5ML IJ INJ
0.5000 mL | INJECTION | INTRAMUSCULAR | Status: AC
  Administered 2018-03-12: 0.5 mL via INTRAMUSCULAR
  Filled 2018-03-11: qty 0.5

## 2018-03-11 NOTE — ED Notes (Signed)
  Patient was placed in 4 point restraints per parent/family request before establishing IV access.  Mother of patient was very loud and verbally abusive to staff members while refusing to let Billy Ayala take care of her son.  Dr. Hyacinth Meeker came to speak with the mother and gave a verbal order to use the restraints until PIV access was obtained.  The restraints were applied at 2045 and removed at 2050 once PIV was established and wrapped up.

## 2018-03-11 NOTE — Progress Notes (Addendum)
On call provider notified of intermittent elevated HR to 130's-140's, noted to go as high as 170's briefly, hovering at the elevated HR before trending back down to low 100's. Initially given orders to administer IVF bolus. Once bolus completed, pt noted to occasionally return to 120's- 130's for a few seconds still, before returning to high 90's, low 100's. Pt has been lying in bed the whole time, calm and cooperative. Will continue to monitor as per MD recommendations, will notify if any new changes.

## 2018-03-11 NOTE — Progress Notes (Addendum)
Family Medicine Teaching Service Daily Progress Note Intern Pager: [phone]  Patient name: Billy Ayala Medical record number: [id number] Date of birth: 2000-09-22 Age: 17 y.o. Gender: male  Primary Care Provider: Tillman Sers, DO Consultants: Neurology (curbside) Code Status: Full  Pt Overview and Major Events to Date:  Billy Ayala is a 17 y.o. male presenting with seizure and hypoxia . PMH is significant for autism, seizure disorder, h/o diffuse alveolar hemorrhage.  Assessment and Plan: Seizure: Did not had any more seizure since admission.  Appears to his baseline according to the mother.  Patient is minimally verbal at baseline. -Continue Keppra 1500 mg twice daily-patient need solution.(Keppra solution is also covered under Medicaid) -Might need Depakote as a second agent for breakthrough seizures. -Need to follow-up with his neurologist as an outpatient. -Sitter was ordered to give mother a little time off, the patient becomes agitated when left alone.  Shortness of breath.  Hypoxia and shortness of breath has been resolved.  Patient was saturating well on room air.  Having mild intermittent cough. Chest x-ray shows improvement in his possible infiltrate noted on x-ray during admission.  Unasyn was started because of some concern of aspiration. 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 -Discontinue Unasyn.   Tachycardia.  On review of telemetry transient intermittent  tachycardic with heart rate in 150s to 170s.  Most likely secondary to anxiety.  Got some IV fluids already.  Able to tolerate p.o. intake very well now. -Keep monitoring.  FEN/GI: Regular PPx: SCds  Disposition: Most likely be discharged tomorrow.  Subjective: Patient was feeling better when seen this morning.  He is minimally verbal at baseline.  Appears comfortable.  Mother was at bedside answering most of the questions.  No new complaints.  No  worsening cough or hemoptysis.  Remained afebrile.  Objective: Temp:  [98.3 F (36.8 C)-99.9 F (37.7 C)] 99 F (37.2 C) (09/08 1217) Pulse Rate:  [101-138] 106 (09/08 1217) Resp:  [22-37] 25 (09/08 1217) BP: (116-157)/(75-106) 140/96 (09/08 1217) SpO2:  [92 %-100 %] 100 % (09/08 1217) Weight:  [103.2 kg] 103.2 kg (09/08 0116) Physical Exam: General: Well-developed, well-nourished young adult, in no acute distress Cardiovascular: Regular rate and rhythm. Respiratory: Clear bilaterally, no wheezing or rhonchi. Abdomen: Soft, nondistended, nontender, bowel sounds positive. Extremities: No edema, no cyanosis, pulses intact and symmetrical.  Laboratory: Recent Labs  Lab 03/10/18 2015 03/11/18 0607  WBC 8.3 8.9  HGB 16.2 13.9  HCT 51.7 43.8  PLT 361 323   Recent Labs  Lab 03/10/18 2015 03/11/18 0607  NA 139 139  K 3.9 3.4*  CL 101 104  CO2 26 26  BUN 7 5*  CREATININE 1.01 0.86  CALCIUM 9.5 8.9  PROT 7.6  --   BILITOT 0.6  --   ALKPHOS 87  --   ALT 33  --   AST 38  --   GLUCOSE 103* 103Arnetha Courser, MD 03/11/2018, 1:06 PM PGY-3, [facility] Family Medicine FPTS Intern pager: [phone], text pages welcome

## 2018-03-11 NOTE — Plan of Care (Signed)
Pt admitted to floor, accompanied by mother at bedside. Alert, no c/o pain, no s/s sz activity noted. Pt and parent oriented to room and POC, mother verbalized understanding.

## 2018-03-12 DIAGNOSIS — R000 Tachycardia, unspecified: Secondary | ICD-10-CM

## 2018-03-12 DIAGNOSIS — R0902 Hypoxemia: Secondary | ICD-10-CM

## 2018-03-12 DIAGNOSIS — R569 Unspecified convulsions: Secondary | ICD-10-CM

## 2018-03-12 DIAGNOSIS — E876 Hypokalemia: Secondary | ICD-10-CM

## 2018-03-12 MED ORDER — LORATADINE 10 MG PO TABS
10.0000 mg | ORAL_TABLET | Freq: Every day | ORAL | Status: DC
  Administered 2018-03-12: 10 mg via ORAL
  Filled 2018-03-12: qty 1

## 2018-03-12 MED ORDER — LORATADINE 10 MG PO TABS: 10.0000 mg | ORAL_TABLET | Freq: Every day | ORAL | 0 refills | Status: DC

## 2018-03-12 MED ORDER — POTASSIUM CHLORIDE 20 MEQ PO PACK
40.0000 meq | PACK | Freq: Once | ORAL | Status: AC
  Administered 2018-03-12: 40 meq via ORAL
  Filled 2018-03-12: qty 2

## 2018-03-12 MED ORDER — LEVETIRACETAM 100 MG/ML PO SOLN: 1500.0000 mg | Freq: Two times a day (BID) | ORAL | 0 refills | Status: DC

## 2018-03-12 NOTE — Progress Notes (Signed)
Pt. Refusing labs and vaccine injections. Notified Dr. Talbert Forest who requested staff restrain patient temporarily for labs/injections. Mom at bedside and in agreement.

## 2018-03-12 NOTE — Progress Notes (Addendum)
Family Medicine Teaching Service Daily Progress Note Intern Pager: [phone]  Patient name: Billy Ayala Medical record number: [id number] Date of birth: 04/01/2001 Age: 17 y.o. Gender: male  Primary Care Provider: Tillman Sers, DO Consultants: none Code Status: Full code  Pt Overview and Major Events to Date:  Breakthrough seizure   Assessment and Plan:  17 year old make presenting with breakthrough seizures.   Breakthrough Seizure on Keppra: Keppra dose increased to 1500 mg daily from 1000 mg. No seizure activity overnight. Mom reports he has returned to his baseline in regards to his behavior. Patient may require further seizure control if Keppra level not in therapeutic range. Depakote may be used as next second line. Patient with normal LFT's on 03/10/18.  -check Keppra level -continue Keppra 1500mg  BID  -arrange for neurology follow up as outpatient  -continue to monitor for seizure activity  -Mother prefers to use restraints for lab draw as patient has history of not cooperating with needle sticks  - on d/c will need Keppra PO solution   Tachycardia: patient continues to have episodes of tachycardia to 117 bpm overnight. Believed to be due to patient's history of anxiety. Patient with HR o/n 60-80 with isolated 110's.  -continue to monitor on telemetry  Hypokalemia: Potassium was 3.4 on 03/11/18.  -replete potassium with 40 mEq (2 packets of powder in applesauce)   FEN/GI: regular diet  PPx: SCDs  Disposition: Patient is stable for discharge  Subjective:   Patient's mother reports that he is not complaining of any pain. Her mother (patient's grandmother) stayed with Brandt overnight and reported that he coughed some but was non-productive. Patient appears comfortable and is walking around room smiling and cooperative with sitter and examiner.   Objective: Temp:  [98.1 F (36.7 C)-99.1 F (37.3 C)] 98.3 F (36.8 C) (09/09 0845) Pulse Rate:  [69-117] 112 (09/09  0845) Resp:  [15-30] 20 (09/09 0845) BP: (129-144)/(83-98) 137/93 (09/09 0845) SpO2:  [96 %-100 %] 99 % (09/09 0845)  Physical Exam: General: male appearing stated age, walking around room, in NAD  Cardiovascular: regular rhythm, tachycardiac Respiratory: CTAB, aerating well, no wheezes or crackles  Abdomen: soft, NT, ND + BS throughout  Extremities: moves all extremities, no cyanosis or edema   Laboratory: Recent Labs  Lab 03/10/18 2015 03/11/18 0607  WBC 8.3 8.9  HGB 16.2 13.9  HCT 51.7 43.8  PLT 361 323   Recent Labs  Lab 03/10/18 2015 03/11/18 0607  NA 139 139  K 3.9 3.4*  CL 101 104  CO2 26 26  BUN 7 5*  CREATININE 1.01 0.86  CALCIUM 9.5 8.9  PROT 7.6  --   BILITOT 0.6  --   ALKPHOS 87  --   ALT 33  --   AST 38  --   GLUCOSE 103* 103*   Imaging/Diagnostic Tests: No results found.   Marchia Meiers, Medical Student 03/12/2018, 12:21 PM Acting Intern pager: [phone]   FPTS Upper-Level Resident Addendum   I have independently interviewed and examined the patient. I have discussed the above with the original author and agree with their documentation. My edits for correction/addition/clarification are in purple. Please see also any attending notes.    [place] Kerigan Narvaez, DO PGY-2, [facility] Family Medicine 03/12/2018 1:54 PM  FPTS Service pager: [phone] (text pages welcome through [facility])

## 2018-03-12 NOTE — Discharge Instructions (Signed)
Please return to the ED if you have continued seizure activity or trouble breathing.

## 2018-03-12 NOTE — Telephone Encounter (Signed)
Pt seen  By IM 03-10-18 and increased keppra to 1500mg  po BID.  Pt has appt Monday 03-19-18.

## 2018-03-12 NOTE — Progress Notes (Signed)
Pt. W/discharge orders, mother just arrived. Discharge instructions and scripts given, the pt.'s mother verbalized understanding. Discharged via wheelchair.

## 2018-03-12 NOTE — Care Management Note (Signed)
Case Management Note  Patient Details  Name: Billy Ayala MRN: [id number] Date of Birth: 01-Jan-2001  Subjective/Objective:    Pt in with a seizure. He is from home with his mother.                 Action/Plan: Pt discharging home with self care. Pt's mother to provide supervision at home and transportation to home.   Expected Discharge Date:  03/12/18               Expected Discharge Plan:  Home/Self Care  In-House Referral:     Discharge planning Services     Post Acute Care Choice:    Choice offered to:     DME Arranged:    DME Agency:     HH Arranged:    HH Agency:     Status of Service:  Completed, signed off  If discussed at [employer] of Stay Meetings, dates discussed:    Additional Comments:  Kermit Balo, RN 03/12/2018, 3:34 PM

## 2018-03-12 NOTE — Discharge Summary (Signed)
Family Medicine Teaching [facility] Discharge Summary  Patient name: Billy Ayala Medical record number: [id number] Date of birth: [birth year] Age: 17 y.o. Gender: male Date of Admission: 03/10/2018  Date of Discharge: 03/12/2018 Admitting Physician: Gust Rung, DO  Primary Care Provider: Tillman Sers, DO Consultants: none  Indication for Hospitalization: breakthrough seizures on Keppra 1000mg  BID   Discharge Diagnoses/Problem List:  Seizure Disorder with breakthrough seizure  Disposition: home with neurology follow up as outpatient for recent medication adjustment  Discharge Condition: improved and stable for discharge  Discharge Exam: Physical Exam  Constitutional: He appears well-developed and well-nourished. No distress.  HENT:  Head: Atraumatic.  Mouth/Throat: Oropharynx is clear and moist.  Eyes: Pupils are equal, round, and reactive to light. Conjunctivae and EOM are normal. No scleral icterus.  Neck: Normal range of motion. Neck supple.  Cardiovascular: Regular rhythm, normal heart sounds and intact distal pulses.  Tachycardiac   Respiratory: Effort normal and breath sounds normal. No stridor. No respiratory distress. He has no wheezes. He has no rales.  GI: Soft. Bowel sounds are normal. He exhibits no distension. There is no tenderness.  Musculoskeletal: Normal range of motion. He exhibits no edema or deformity.  Neurological: He is alert.  Skin: Skin is warm and dry. He is not diaphoretic. No erythema. No pallor.   Brief Hospital Course:  Billy Ayala was admitted on 03/09/18 after a witness tonic clonic seizure. His mother reported finding him on the floor exhibiting shaking movements and brought him for evaluation in the ED. Following his initial presentation, he was given increased dose of Keppra from 1000mg  BID to 1500mg  BID with Keppra levels measured within therapeutic range. After two hospital days, he was found to behave at his baseline per his mother and  continued to not seize and was thus deemed stable for discharge.   Issues for Follow Up:  1. Breakthrough seizure on Keppra 1000mg  BID  2. Increased Keppra dosage to 1500mg  BID   Significant Procedures:  none  Significant Labs and Imaging:  No results for input(s): WBC, HGB, HCT, PLT in the last 168 hours. No results for input(s): NA, K, CL, CO2, GLUCOSE, BUN, CREATININE, CALCIUM, MG, PHOS, ALKPHOS, AST, ALT, ALBUMIN, PROTEIN in the last 168 hours.  Invalid input(s): TBILI Keppra Level:  32.5   Results/Tests Pending at Time of Discharge: none  Discharge Medications:  Allergies as of 03/12/2018   No Known Allergies     Medication List    STOP taking these medications   levETIRAcetam 1000 MG tablet Commonly known as:  KEPPRA     TAKE these medications   loratadine 10 MG tablet Commonly known as:  CLARITIN Take 1 tablet (10 mg total) by mouth daily.       Discharge Instructions: Please refer to Patient Instructions section of EMR for full details.  Patient was counseled important signs and symptoms that should prompt return to medical care, changes in medications, dietary instructions, activity restrictions, and follow up appointments.   You were admitted to the hospital for breakthrough seizure activity.   Your Keppra dosage was increased to 1500 mg daily. Please continue to take 1500 mg of Keppra twice daily in the solution form.   Please follow up with updated neurology appointment with Dr. Marjory Lies appointment on Sept 16, 2019 at 2:30PM.   Please return see your physician or emergency provider if you develop seizure activity or develop a fever, start coughing up blood, chest pain or have pain with breathing.  Follow-Up Appointments: Follow-up Information    Suanne Marker, MD. Go on 03/19/2018.   Specialties:  Neurology, Radiology Why:  Your appointment is scheduled for 2:30pm, please arrive by 2:00pm.  Contact information: [address] [place] [address] [phone]           Kelyn Koskela, [place], DO 04/03/2018, 4:05 PM

## 2018-03-15 LAB — LEVETIRACETAM LEVEL: Levetiracetam Lvl: 32.5 ug/mL (ref 10.0–40.0)

## 2018-03-19 ENCOUNTER — Ambulatory Visit (INDEPENDENT_AMBULATORY_CARE_PROVIDER_SITE_OTHER): Payer: Medicaid Other | Admitting: Diagnostic Neuroimaging

## 2018-03-19 ENCOUNTER — Encounter: Payer: Self-pay | Admitting: Diagnostic Neuroimaging

## 2018-03-19 VITALS — BP 145/89 | HR 103 | Ht 76.0 in | Wt 235.4 lb

## 2018-03-19 DIAGNOSIS — G40909 Epilepsy, unspecified, not intractable, without status epilepticus: Secondary | ICD-10-CM

## 2018-03-19 MED ORDER — LEVETIRACETAM 100 MG/ML PO SOLN: 1500.0000 mg | Freq: Two times a day (BID) | ORAL | 12 refills | Status: DC

## 2018-03-19 NOTE — Progress Notes (Signed)
[facility]  PATIENT: Billy Ayala DOB: 08/11/2000  REFERRING CLINICIAN: A Riccio, DO HISTORY FROM: mother, grandmother, chart review, patient REASON FOR VISIT: follow up   HISTORICAL  CHIEF COMPLAINT:  Chief Complaint  Patient presents with  . Follow-up  . Seizures/ autism    Seen ED / IM Keppra 1500mg  po BID    HISTORY OF PRESENT ILLNESS:   UPDATE (03/19/18, VRP): Since last visit, had 2 more seizures. LEV has been increased. Last sz no 03/11/18. No alleviating or aggravating factors. Tolerating medication.   PRIOR HPI (02/27/18): 17 year old male here for evaluation of seizures.  History of autism.  June 2019 patient was at home, collapsed and had generalized convulsions.  Patient was taken to the emergency room and then began to cough and vomit blood.  Patient was also diagnosed with diffuse alveolar hemorrhage.  He was started on antiseizure medication.  No specific etiology for hemoptysis was found.  July 2019 patient returned to the hospital for another seizure.  Again patient began to have pulmonary edema and hemoptysis.  Patient was immediately started on steroids and antiseizure medication dose was increased.  Since that time patient has been doing well.  No further seizures or hemoptysis.  He has been following up with PCP and pulmonary clinic.  He has additional pulmonary testing scheduled for October 2019.  No prior history of seizures.  There is family history of seizure in patient's father and paternal aunt.  No side effects of antiseizure medication at this time.   REVIEW OF SYSTEMS: Full 14 system review of systems performed and negative with exception of: only as per HPI.  ALLERGIES: No Known Allergies  HOME MEDICATIONS: Outpatient Medications Prior to Visit  Medication Sig Dispense Refill  . levETIRAcetam (KEPPRA) 100 MG/ML solution Take 15 mLs (1,500 mg total) by mouth 2 (two) times daily. 473 mL 0  . loratadine (CLARITIN) 10 MG  tablet Take 1 tablet (10 mg total) by mouth daily. 30 tablet 0   No facility-administered medications prior to visit.     PAST MEDICAL HISTORY: Past Medical History:  Diagnosis Date  . Autism   . Seizure (HCC)   . Sickle cell trait (HCC)     PAST SURGICAL HISTORY: No past surgical history on file.  FAMILY HISTORY: Family History  Problem Relation Age of Onset  . Asthma Mother   . Hypertension Father   . Sickle cell trait Father   . Bipolar disorder Father   . Asthma Sister   . Hypertension Maternal Grandmother   . Early death Neg Hx     SOCIAL HISTORY: Social History   Socioeconomic History  . Marital status: Single    Spouse name: Not on file  . Number of children: Not on file  . Years of education: Not on file  . Highest education level: Not on file  Occupational History  . Not on file  Social Needs  . Financial resource strain: Not on file  . Food insecurity:    Worry: Not on file    Inability: Not on file  . Transportation needs:    Medical: Not on file    Non-medical: Not on file  Tobacco Use  . Smoking status: Never Smoker  . Smokeless tobacco: Never Used  Substance and Sexual Activity  . Alcohol use: Never    Frequency: Never  . Drug use: Never  . Sexual activity: Not on file  Lifestyle  . Physical activity:    Days per week:  Not on file    Minutes per session: Not on file  . Stress: Not on file  Relationships  . Social connections:    Talks on phone: Not on file    Gets together: Not on file    Attends religious service: Not on file    Active member of club or organization: Not on file    Attends meetings of clubs or organizations: Not on file    Relationship status: Not on file  . Intimate partner violence:    Fear of current or ex partner: Not on file    Emotionally abused: Not on file    Physically abused: Not on file    Forced sexual activity: Not on file  Other Topics Concern  . Not on file  Social History Narrative   Lives with  mom, three half brothers and boyfriend      Goes to [employer] full school days (6th grader)   Right handed    No caffeine      PHYSICAL EXAM  GENERAL EXAM/CONSTITUTIONAL: Vitals:  Vitals:   03/19/18 1434  BP: (!) 145/89  Pulse: (!) 103  Weight: 235 lb 6.4 oz (106.8 kg)  Height: 6\' 4"  (1.93 m)   Body mass index is 28.65 kg/m. Wt Readings from Last 3 Encounters:  03/19/18 235 lb 6.4 oz (106.8 kg) (99 %, Z= 2.19)*  03/11/18 227 lb 8.2 oz (103.2 kg) (98 %, Z= 2.06)*  03/03/18 216 lb 0.8 oz (98 kg) (97 %, Z= 1.84)*   * Growth percentiles are based on CDC (Boys, 2-20 Years) data.    Patient is in no distress; well developed, nourished and groomed; neck is supple  CARDIOVASCULAR:  Examination of carotid arteries is normal; no carotid bruits  Regular rate and rhythm, no murmurs  Examination of peripheral vascular system by observation and palpation is normal  EYES:  Ophthalmoscopic exam of optic discs and posterior segments is normal; no papilledema or hemorrhages No exam data present  MUSCULOSKELETAL:  Gait, strength, tone, movements noted in Neurologic exam below  NEUROLOGIC: MENTAL STATUS:  No flowsheet data found.  awake, alert, oriented to person  [facility] memory   normal attention and concentration  DECR FLUENCY; FOLLOW SIMPLE COMMANDS  fund of knowledge appropriate  CRANIAL NERVE:   2nd - no papilledema on fundoscopic exam  2nd, 3rd, 4th, 6th - pupils equal and reactive to light, visual fields full to confrontation, extraocular muscles intact, no nystagmus  5th - facial sensation symmetric  7th - facial strength symmetric  8th - hearing intact  9th - palate elevates symmetrically, uvula midline  11th - shoulder shrug symmetric  12th - tongue protrusion midline  MOTOR:   normal bulk and tone, full strength in the BUE, BLE  SENSORY:   normal and symmetric to light touch  COORDINATION:   finger-nose-finger, fine finger movements  normal  REFLEXES:   deep tendon reflexes present and symmetric  GAIT/STATION:   narrow based gait     DIAGNOSTIC DATA (LABS, IMAGING, TESTING) - I reviewed patient records, labs, notes, testing and imaging myself where available.  Lab Results  Component Value Date   WBC 8.9 03/11/2018   HGB 13.9 03/11/2018   HCT 43.8 03/11/2018   MCV 76.7 (L) 03/11/2018   PLT 323 03/11/2018      Component Value Date/Time   NA 139 03/11/2018 0607   K 3.4 (L) 03/11/2018 0607   CL 104 03/11/2018 0607   CO2 26 03/11/2018 0607   GLUCOSE  103 (H) 03/11/2018 0607   BUN 5 (L) 03/11/2018 0607   CREATININE 0.86 03/11/2018 0607   CALCIUM 8.9 03/11/2018 0607   PROT 7.6 03/10/2018 2015   ALBUMIN 3.9 03/10/2018 2015   AST 38 03/10/2018 2015   ALT 33 03/10/2018 2015   ALKPHOS 87 03/10/2018 2015   BILITOT 0.6 03/10/2018 2015   GFRNONAA >60 03/11/2018 0607   GFRAA >60 03/11/2018 0607   Lab Results  Component Value Date   TRIG 184 (H) 01/14/2018   No results found for: HGBA1C Lab Results  Component Value Date   VITAMINB12 248 01/15/2018   Lab Results  Component Value Date   TSH 0.422 12/14/2017    12/13/17 EEG - This is a normal sleep/sedated EEG for the patients stated age.  There were no focal, hemispheric or lateralizing features.  No epileptiform activity was recorded.  Comment cannot be made on waking rhythm as the patient was sedated with Versed and Precedex.  A normal EEG does not exclude the diagnosis of a seizure disorder and if seizure remains high on the list of differential diagnosis, a repeat EEG when off of sedation may be of value.  Correlate clinically.  12/13/17 MRI brain [I reviewed images myself and agree with interpretation. -VRP]  - Unremarkable appearance of the brain.    ASSESSMENT AND PLAN  17 y.o. year old male here with autism and seizure disorder.    Initial 2 seizures may have triggered pulmonary edema and hemoptysis. Other possibility is underlying  pulmonary disease (autoimmune) leading to hypoxia and seizure secondarily.  I would favor that patient had seizure as the initial event for both admissions; this is based on history per family.   Dx:  1. Seizure disorder (HCC)      PLAN:  - Continue levetiracetam 1500mg  twice a day   - Please maintain precautions. Do not participate in activities where a loss of awareness could harm you or someone else. No swimming alone, no tub bathing, no hot tubs, no driving, no operating motorized vehicles (cars, ATVs, motocycles, etc), lawnmowers, power tools or firearms. No standing at heights, such as rooftops, ladders or stairs. Avoid hot objects such as stoves, heaters, open fires. Wear a helmet when riding a bicycle, scooter, skateboard, etc. and avoid areas of traffic. Set your water heater to 120 degrees or less.  Meds ordered this encounter  Medications  . levETIRAcetam (KEPPRA) 100 MG/ML solution    Sig: Take 15 mLs (1,500 mg total) by mouth 2 (two) times daily.    Dispense:  473 mL    Refill:  12   Return for follow up in March 2020.    Suanne Marker, MD 03/19/2018, 2:45 PM Certified in Neurology, Neurophysiology and Neuroimaging  [facility] [address] [place], [place] [address] [phone]

## 2018-04-09 ENCOUNTER — Other Ambulatory Visit: Payer: Self-pay

## 2018-04-09 ENCOUNTER — Ambulatory Visit (INDEPENDENT_AMBULATORY_CARE_PROVIDER_SITE_OTHER)
Admission: RE | Admit: 2018-04-09 | Discharge: 2018-04-09 | Disposition: A | Discharge status: Home / Self Care | Payer: Medicaid Other | Source: Ambulatory Visit | Attending: Pulmonary Disease | Admitting: Pulmonary Disease

## 2018-04-09 DIAGNOSIS — J849 Interstitial pulmonary disease, unspecified: Secondary | ICD-10-CM

## 2018-04-09 MED ORDER — LORATADINE 10 MG PO TABS: 10.0000 mg | ORAL_TABLET | Freq: Every day | ORAL | 0 refills | Status: DC

## 2018-04-11 ENCOUNTER — Telehealth: Payer: Self-pay | Admitting: Pulmonary Disease

## 2018-04-11 MED ORDER — LORATADINE 10 MG PO TABS: 10.0000 mg | ORAL_TABLET | Freq: Every day | ORAL | 0 refills | Status: DC

## 2018-04-11 NOTE — Telephone Encounter (Signed)
Script resent as it was printed the first time. Jazmin Hartsell,CMA

## 2018-04-11 NOTE — Telephone Encounter (Signed)
Notes recorded by Chilton Greathouse, MD on 04/10/2018 at 10:18 AM EDT Please let patient know chest CT scan shows no obvious lung abnormality.  Pt's mother, Marcelyn [facility]) is aware of results and voiced her understanding.  Pt has been scheduled on 04/16/18 for 74mo f/u per last OV. Nothing further is needed.

## 2018-04-11 NOTE — Addendum Note (Signed)
Addended by: Henri Medal on: 04/11/2018 10:19 AM   Modules accepted: Orders

## 2018-04-16 ENCOUNTER — Ambulatory Visit: Payer: Medicaid Other | Admitting: Pulmonary Disease

## 2018-04-20 ENCOUNTER — Ambulatory Visit: Payer: Medicaid Other | Admitting: Family Medicine

## 2018-04-20 DIAGNOSIS — L309 Dermatitis, unspecified: Secondary | ICD-10-CM | POA: Insufficient documentation

## 2018-04-20 NOTE — Progress Notes (Deleted)
    SUBJECTIVE:  PCP: Tillman Sers, DO Patient ID: MRN [id number]  Date of birth: September 20, 2000  HPI Billy Ayala is a 17 y.o. male who presents to clinic with chief complaint of ***. Other complaints today include ***.   #*** Location: ***. Quality: ***. Quantity: ***.  Timing: ***. Setting: ***. Modifying Factors: ***. Associated: ***. Pt Concerns: ***.  Rash: Patient complains of rash involving the {body location:12896}. Rash started {numbers; 0-10:33138} {time units w/ wo plural:11} ago. Appearance of rash at onset: {rash appear:16498}. Rash {has/not:18111} changed over time Initial distribution: {body location:12896}.  Discomfort associated with rash: {rash dc:16501}.  Associated symptoms: {rash assoc:16502}. Denies: {rash assoc:16502}. Patient {has/not:18111} had previous evaluation of rash. Patient {has/not:18111} had previous treatment.  Response to treatment: ***. Patient {has/not:18111} had contacts with similar rash. Patient {has/not:18111} identified precipitant. Patient {has/not:18111} had new exposures (soaps, lotions, laundry detergents, foods, medications, plants, insects or animals.)  Review of Symptoms: See HPI  HISTORY Medications & Allergies: Reviewed with patient and updated in EMR as appropriate.   PMHx:  Patient Active Problem List   Diagnosis Date Noted  . Eczema 04/20/2018  . Hypoxia   . Seizure (HCC) 03/10/2018  . History of ETT   . Hb-SS disease without crisis (HCC)   . Hemoptysis 01/14/2018  . Seizure-like activity (HCC) 12/21/2017  . Obesity 08/25/2011  . Autism spectrum disorder 09/17/2007  . SICKLE CELL TRAIT 08/31/2006   No past surgical history on file.  FHx:  family history includes Asthma in his mother and sister; Bipolar disorder in his father; Hypertension in his father and maternal grandmother; Sickle cell trait in his father. There is no history of Early death.  SHx  reports that he has never smoked. He has never used smokeless tobacco.  He reports that he does not drink alcohol or use drugs.  OBJECTIVE:  There were no vitals taken for this visit.  Physical Exam:  Gen: NAD, alert, non-toxic, well-appearing, sitting comfortably  Skin: *** Otherwise warm and dry. No obvious rashes, lesions, or trauma.  HEENT: NCAT. PERRLA. No conjunctival pallor or injection. No scleral icterus or injection.  MMM.  CV: RRR.  Normal S1-S2.  RP & DPs 2+ bilaterally. No BLEE. Resp: CTAB.  No wheezing, rales, abnormal lung sounds.  No increased WOB Abd: NTND on palpation to all 4 quadrants.  Positive bowel sounds. Psych: Cooperative with exam. Pleasant. Makes eye contact. Speech normal. Extremities: Moves all extremities spontaneously  Neuro: CN II-XII grossly intact. No FNDs.  Back: spine symmetric w/o abnormal curvature. No TTP C/T/L spinous processes   Pertinent Labs & Imaging:  Reviewed in chart   ASSESSMENT & PLAN:  No problem-specific Assessment & Plan notes found for this encounter.    Genia Hotter, M.D. [facility]  PGY -1 04/20/2018, 10:09 AM

## 2018-04-27 ENCOUNTER — Inpatient Hospital Stay (HOSPITAL_COMMUNITY): Payer: Medicaid Other

## 2018-04-27 ENCOUNTER — Inpatient Hospital Stay (HOSPITAL_COMMUNITY)
Admission: EM | Admit: 2018-04-27 | Discharge: 2018-05-04 | DRG: 207 | Disposition: A | Discharge status: Home / Self Care | Payer: Medicaid Other | Attending: Family Medicine | Admitting: Family Medicine

## 2018-04-27 ENCOUNTER — Emergency Department (HOSPITAL_COMMUNITY): Payer: Medicaid Other

## 2018-04-27 DIAGNOSIS — E669 Obesity, unspecified: Secondary | ICD-10-CM | POA: Diagnosis present

## 2018-04-27 DIAGNOSIS — Z825 Family history of asthma and other chronic lower respiratory diseases: Secondary | ICD-10-CM

## 2018-04-27 DIAGNOSIS — E870 Hyperosmolality and hypernatremia: Secondary | ICD-10-CM | POA: Diagnosis not present

## 2018-04-27 DIAGNOSIS — R569 Unspecified convulsions: Secondary | ICD-10-CM

## 2018-04-27 DIAGNOSIS — Z0189 Encounter for other specified special examinations: Secondary | ICD-10-CM | POA: Diagnosis not present

## 2018-04-27 DIAGNOSIS — D72829 Elevated white blood cell count, unspecified: Secondary | ICD-10-CM | POA: Diagnosis present

## 2018-04-27 DIAGNOSIS — Z978 Presence of other specified devices: Secondary | ICD-10-CM | POA: Diagnosis not present

## 2018-04-27 DIAGNOSIS — J849 Interstitial pulmonary disease, unspecified: Secondary | ICD-10-CM | POA: Diagnosis present

## 2018-04-27 DIAGNOSIS — Z781 Physical restraint status: Secondary | ICD-10-CM | POA: Diagnosis not present

## 2018-04-27 DIAGNOSIS — R6510 Systemic inflammatory response syndrome (SIRS) of non-infectious origin without acute organ dysfunction: Secondary | ICD-10-CM | POA: Diagnosis present

## 2018-04-27 DIAGNOSIS — K920 Hematemesis: Secondary | ICD-10-CM | POA: Diagnosis present

## 2018-04-27 DIAGNOSIS — D573 Sickle-cell trait: Secondary | ICD-10-CM | POA: Diagnosis present

## 2018-04-27 DIAGNOSIS — Z9911 Dependence on respirator [ventilator] status: Secondary | ICD-10-CM | POA: Diagnosis not present

## 2018-04-27 DIAGNOSIS — Z818 Family history of other mental and behavioral disorders: Secondary | ICD-10-CM

## 2018-04-27 DIAGNOSIS — Z68.41 Body mass index (BMI) pediatric, greater than or equal to 95th percentile for age: Secondary | ICD-10-CM

## 2018-04-27 DIAGNOSIS — F840 Autistic disorder: Secondary | ICD-10-CM | POA: Diagnosis present

## 2018-04-27 DIAGNOSIS — G40409 Other generalized epilepsy and epileptic syndromes, not intractable, without status epilepticus: Secondary | ICD-10-CM | POA: Diagnosis present

## 2018-04-27 DIAGNOSIS — F419 Anxiety disorder, unspecified: Secondary | ICD-10-CM | POA: Diagnosis present

## 2018-04-27 DIAGNOSIS — Z8249 Family history of ischemic heart disease and other diseases of the circulatory system: Secondary | ICD-10-CM | POA: Diagnosis not present

## 2018-04-27 DIAGNOSIS — R042 Hemoptysis: Secondary | ICD-10-CM | POA: Diagnosis present

## 2018-04-27 DIAGNOSIS — J9601 Acute respiratory failure with hypoxia: Secondary | ICD-10-CM | POA: Diagnosis present

## 2018-04-27 DIAGNOSIS — R451 Restlessness and agitation: Secondary | ICD-10-CM | POA: Diagnosis not present

## 2018-04-27 DIAGNOSIS — G9341 Metabolic encephalopathy: Secondary | ICD-10-CM | POA: Diagnosis present

## 2018-04-27 DIAGNOSIS — Z832 Family history of diseases of the blood and blood-forming organs and certain disorders involving the immune mechanism: Secondary | ICD-10-CM

## 2018-04-27 DIAGNOSIS — Z79899 Other long term (current) drug therapy: Secondary | ICD-10-CM

## 2018-04-27 DIAGNOSIS — T17918A Gastric contents in respiratory tract, part unspecified causing other injury, initial encounter: Secondary | ICD-10-CM | POA: Diagnosis present

## 2018-04-27 DIAGNOSIS — R0489 Hemorrhage from other sites in respiratory passages: Secondary | ICD-10-CM | POA: Diagnosis present

## 2018-04-27 DIAGNOSIS — J810 Acute pulmonary edema: Principal | ICD-10-CM | POA: Diagnosis present

## 2018-04-27 DIAGNOSIS — X58XXXA Exposure to other specified factors, initial encounter: Secondary | ICD-10-CM | POA: Diagnosis present

## 2018-04-27 DIAGNOSIS — J9600 Acute respiratory failure, unspecified whether with hypoxia or hypercapnia: Secondary | ICD-10-CM | POA: Diagnosis present

## 2018-04-27 DIAGNOSIS — T85598A Other mechanical complication of other gastrointestinal prosthetic devices, implants and grafts, initial encounter: Secondary | ICD-10-CM

## 2018-04-27 LAB — I-STAT ARTERIAL BLOOD GAS, ED
Acid-base deficit: 1 mmol/L (ref 0.0–2.0)
Bicarbonate: 27.2 mmol/L (ref 20.0–28.0)
O2 Saturation: 100 %
PCO2 ART: 56.6 mmHg — AB (ref 32.0–48.0)
PO2 ART: 292 mmHg — AB (ref 83.0–108.0)
Patient temperature: 99
TCO2: 29 mmol/L (ref 22–32)
pH, Arterial: 7.291 — ABNORMAL LOW (ref 7.350–7.450)

## 2018-04-27 LAB — COMPREHENSIVE METABOLIC PANEL
ALBUMIN: 3.9 g/dL (ref 3.5–5.0)
ALK PHOS: 103 U/L (ref 38–126)
ALT: 25 U/L (ref 0–44)
AST: 32 U/L (ref 15–41)
Anion gap: 14 (ref 5–15)
BILIRUBIN TOTAL: 0.5 mg/dL (ref 0.3–1.2)
BUN: 9 mg/dL (ref 6–20)
CO2: 26 mmol/L (ref 22–32)
CREATININE: 1.09 mg/dL (ref 0.61–1.24)
Calcium: 10.2 mg/dL (ref 8.9–10.3)
Chloride: 100 mmol/L (ref 98–111)
GFR calc Af Amer: >60 mL/min (ref >60)
GLUCOSE: 119 mg/dL — AB (ref 70–99)
Potassium: 4.2 mmol/L (ref 3.5–5.1)
Sodium: 140 mmol/L (ref 135–145)
TOTAL PROTEIN: 7.3 g/dL (ref 6.5–8.1)

## 2018-04-27 LAB — CBC WITH DIFFERENTIAL/PLATELET
ABS IMMATURE GRANULOCYTES: 0.13 10*3/uL — AB (ref 0.00–0.07)
Basophils Absolute: 0 10*3/uL (ref 0.0–0.1)
Basophils Relative: 0 %
EOS ABS: 0 10*3/uL (ref 0.0–0.5)
Eosinophils Relative: 0 %
HEMATOCRIT: 52.2 % — AB (ref 39.0–52.0)
HEMOGLOBIN: 16.2 g/dL (ref 13.0–17.0)
IMMATURE GRANULOCYTES: 1 %
LYMPHS ABS: 2.7 10*3/uL (ref 0.7–4.0)
LYMPHS PCT: 21 %
MCH: 23.6 pg — ABNORMAL LOW (ref 26.0–34.0)
MCHC: 31 g/dL (ref 30.0–36.0)
MCV: 76 fL — AB (ref 80.0–100.0)
MONOS PCT: 9 %
Monocytes Absolute: 1.2 10*3/uL — ABNORMAL HIGH (ref 0.1–1.0)
NEUTROS ABS: 9.2 10*3/uL — AB (ref 1.7–7.7)
NEUTROS PCT: 69 %
Platelets: 389 10*3/uL (ref 150–400)
RBC: 6.87 MIL/uL — ABNORMAL HIGH (ref 4.22–5.81)
RDW: 16.7 % — ABNORMAL HIGH (ref 11.5–15.5)
WBC: 13.3 10*3/uL — ABNORMAL HIGH (ref 4.0–10.5)
nRBC: 0 % (ref 0.0–0.2)

## 2018-04-27 LAB — I-STAT CHEM 8, ED
BUN: 10 mg/dL (ref 6–20)
CREATININE: 1.1 mg/dL (ref 0.61–1.24)
Calcium, Ion: 1.19 mmol/L (ref 1.15–1.40)
Chloride: 103 mmol/L (ref 98–111)
Glucose, Bld: 119 mg/dL — ABNORMAL HIGH (ref 70–99)
HEMATOCRIT: 52 % (ref 39.0–52.0)
HEMOGLOBIN: 17.7 g/dL — AB (ref 13.0–17.0)
POTASSIUM: 3.9 mmol/L (ref 3.5–5.1)
SODIUM: 141 mmol/L (ref 135–145)
TCO2: 28 mmol/L (ref 22–32)

## 2018-04-27 LAB — LACTIC ACID, PLASMA: LACTIC ACID, VENOUS: 2.6 mmol/L — AB (ref 0.5–1.9)

## 2018-04-27 LAB — TRIGLYCERIDES: Triglycerides: 84 mg/dL (ref <150)

## 2018-04-27 LAB — PHOSPHORUS: Phosphorus: 3.6 mg/dL (ref 2.5–4.6)

## 2018-04-27 LAB — BRAIN NATRIURETIC PEPTIDE: B Natriuretic Peptide: 11.9 pg/mL (ref 0.0–100.0)

## 2018-04-27 LAB — MAGNESIUM: MAGNESIUM: 2.3 mg/dL (ref 1.7–2.4)

## 2018-04-27 MED ORDER — LORAZEPAM 2 MG/ML IJ SOLN: 1.0000 mg | Freq: Once | INTRAMUSCULAR | Status: DC

## 2018-04-27 MED ORDER — INSULIN ASPART 100 UNIT/ML ~~LOC~~ SOLN
0.0000 [IU] | SUBCUTANEOUS | Status: DC
  Administered 2018-04-28 (×4): 2 [IU] via SUBCUTANEOUS
  Administered 2018-04-29: 3 [IU] via SUBCUTANEOUS
  Administered 2018-04-29: 2 [IU] via SUBCUTANEOUS
  Administered 2018-04-29: 3 [IU] via SUBCUTANEOUS
  Administered 2018-04-29 – 2018-04-30 (×5): 2 [IU] via SUBCUTANEOUS
  Administered 2018-04-30: 3 [IU] via SUBCUTANEOUS
  Administered 2018-04-30 – 2018-05-01 (×5): 2 [IU] via SUBCUTANEOUS

## 2018-04-27 MED ORDER — SODIUM CHLORIDE 0.9 % IV SOLN
INTRAVENOUS | Status: DC
  Administered 2018-04-27 – 2018-04-29 (×4): via INTRAVENOUS

## 2018-04-27 MED ORDER — LORAZEPAM 2 MG/ML IJ SOLN
1.0000 mg | Freq: Once | INTRAMUSCULAR | Status: AC
  Administered 2018-04-27: 1 mg via INTRAMUSCULAR
  Filled 2018-04-27: qty 1

## 2018-04-27 MED ORDER — SODIUM CHLORIDE 0.9 % IV SOLN
2000.0000 mg | Freq: Once | INTRAVENOUS | Status: AC
  Administered 2018-04-27: 2000 mg via INTRAVENOUS
  Filled 2018-04-27: qty 20

## 2018-04-27 MED ORDER — ONDANSETRON HCL 4 MG/2ML IJ SOLN: 4.0000 mg | Freq: Four times a day (QID) | INTRAMUSCULAR | Status: DC | PRN

## 2018-04-27 MED ORDER — FENTANYL CITRATE (PF) 100 MCG/2ML IJ SOLN
INTRAMUSCULAR | Status: AC
  Administered 2018-04-27: 100 ug
  Filled 2018-04-27: qty 2

## 2018-04-27 MED ORDER — ETOMIDATE 2 MG/ML IV SOLN
INTRAVENOUS | Status: AC | PRN
  Administered 2018-04-27: 20 mg via INTRAVENOUS

## 2018-04-27 MED ORDER — ROCURONIUM BROMIDE 50 MG/5ML IV SOLN
INTRAVENOUS | Status: AC | PRN
  Administered 2018-04-27: 100 mg via INTRAVENOUS

## 2018-04-27 MED ORDER — PROPOFOL 1000 MG/100ML IV EMUL
INTRAVENOUS | Status: AC
  Filled 2018-04-27: qty 100

## 2018-04-27 MED ORDER — PROPOFOL 1000 MG/100ML IV EMUL: 5.0000 ug/kg/min | INTRAVENOUS | Status: DC

## 2018-04-27 MED ORDER — LEVETIRACETAM IN NACL 500 MG/100ML IV SOLN
500.0000 mg | Freq: Once | INTRAVENOUS | Status: DC
  Filled 2018-04-27: qty 100

## 2018-04-27 MED ORDER — LORAZEPAM 2 MG/ML IJ SOLN: 2.0000 mg | Freq: Once | INTRAMUSCULAR | Status: DC

## 2018-04-27 MED ORDER — FAMOTIDINE IN NACL 20-0.9 MG/50ML-% IV SOLN
20.0000 mg | Freq: Once | INTRAVENOUS | Status: AC
  Administered 2018-04-27: 20 mg via INTRAVENOUS
  Filled 2018-04-27: qty 50

## 2018-04-27 MED ORDER — SENNOSIDES 8.8 MG/5ML PO SYRP
5.0000 mL | ORAL_SOLUTION | Freq: Two times a day (BID) | ORAL | Status: DC | PRN
  Administered 2018-04-29 – 2018-05-02 (×2): 5 mL
  Filled 2018-04-27 (×2): qty 5

## 2018-04-27 MED ORDER — FAMOTIDINE IN NACL 20-0.9 MG/50ML-% IV SOLN: 20.0000 mg | Freq: Once | INTRAVENOUS | Status: DC

## 2018-04-27 MED ORDER — LORAZEPAM 2 MG/ML IJ SOLN
INTRAMUSCULAR | Status: AC
  Administered 2018-04-27: 2 mg
  Filled 2018-04-27: qty 1

## 2018-04-27 MED ORDER — PROPOFOL 1000 MG/100ML IV EMUL
0.0000 ug/kg/min | INTRAVENOUS | Status: DC
  Administered 2018-04-27: 10 ug/kg/min via INTRAVENOUS
  Administered 2018-04-28 (×3): 50 ug/kg/min via INTRAVENOUS
  Filled 2018-04-27 (×3): qty 100

## 2018-04-27 MED ORDER — FENTANYL CITRATE (PF) 100 MCG/2ML IJ SOLN
100.0000 ug | INTRAMUSCULAR | Status: DC | PRN
  Administered 2018-04-28 – 2018-05-02 (×22): 100 ug via INTRAVENOUS
  Filled 2018-04-27 (×21): qty 2

## 2018-04-27 MED ORDER — SODIUM CHLORIDE 0.9 % IV SOLN
1.5000 g | Freq: Four times a day (QID) | INTRAVENOUS | Status: AC
  Administered 2018-04-28 – 2018-05-02 (×20): 1.5 g via INTRAVENOUS
  Filled 2018-04-27 (×20): qty 1.5

## 2018-04-27 MED ORDER — LIDOCAINE HCL (CARDIAC) PF 100 MG/5ML IV SOSY
PREFILLED_SYRINGE | INTRAVENOUS | Status: AC | PRN
  Administered 2018-04-27: 100 mg via INTRAVENOUS

## 2018-04-27 MED ORDER — LEVETIRACETAM IN NACL 1500 MG/100ML IV SOLN
1500.0000 mg | Freq: Two times a day (BID) | INTRAVENOUS | Status: DC
  Administered 2018-04-28 – 2018-05-04 (×13): 1500 mg via INTRAVENOUS
  Filled 2018-04-27 (×14): qty 100

## 2018-04-27 MED ORDER — SODIUM CHLORIDE 0.9 % IV SOLN: 2000.0000 mg | Freq: Once | INTRAVENOUS | Status: DC

## 2018-04-27 MED ORDER — METHYLPREDNISOLONE SODIUM SUCC 125 MG IJ SOLR
125.0000 mg | Freq: Four times a day (QID) | INTRAMUSCULAR | Status: DC
  Administered 2018-04-28 – 2018-04-29 (×6): 125 mg via INTRAVENOUS
  Filled 2018-04-27 (×6): qty 2

## 2018-04-27 MED ORDER — FAMOTIDINE IN NACL 20-0.9 MG/50ML-% IV SOLN
20.0000 mg | Freq: Two times a day (BID) | INTRAVENOUS | Status: DC
  Administered 2018-04-28 – 2018-04-30 (×5): 20 mg via INTRAVENOUS
  Filled 2018-04-27 (×5): qty 50

## 2018-04-27 MED ORDER — FENTANYL CITRATE (PF) 100 MCG/2ML IJ SOLN
100.0000 ug | INTRAMUSCULAR | Status: DC | PRN
  Administered 2018-04-27 (×2): 100 ug via INTRAVENOUS
  Filled 2018-04-27: qty 2

## 2018-04-27 NOTE — Sedation Documentation (Addendum)
Bilateral breath sounds confirmed by RT.

## 2018-04-27 NOTE — ED Provider Notes (Signed)
[facility] EMERGENCY DEPARTMENT Provider Note   CSN: [id number] Arrival date & time: 04/27/18  2011     History   Chief Complaint Chief Complaint  Patient presents with  . Seizures    HPI Chang Q Steinke is a 17 y.o. male.  Patient has a history of autism and seizures.  He had a seizure today while he was eating his dinner and has been coughing up food and blood since then.  The history is provided by a relative. No language interpreter was used.  Seizures   This is a recurrent problem. The current episode started less than 1 hour ago. The problem has been resolved. There was 1 seizure. The most recent episode lasted less than 30 seconds. Associated symptoms include cough. Pertinent negatives include no sleepiness, no headaches, no chest pain and no diarrhea. Characteristics do not include eye blinking. The episode was witnessed. There was no sensation of an aura present. The seizure(s) had no focality. Possible causes do not include medication or dosage change. There has been no fever. There were no medications administered prior to arrival.    Past Medical History:  Diagnosis Date  . Autism   . Seizure (HCC)   . Sickle cell trait [facility])     Patient Active Problem List   Diagnosis Date Noted  . Eczema 04/20/2018  . Hypoxia   . Seizure (HCC) 03/10/2018  . History of ETT   . Hb-SS disease without crisis (HCC)   . Hemoptysis 01/14/2018  . Seizure-like activity (HCC) 12/21/2017  . Obesity 08/25/2011  . Autism spectrum disorder 09/17/2007  . SICKLE CELL TRAIT 08/31/2006    No past surgical history on file.      Home Medications    Prior to Admission medications   Medication Sig Start Date End Date Taking? Authorizing Provider  levETIRAcetam (KEPPRA) 100 MG/ML solution Take 15 mLs (1,500 mg total) by mouth 2 (two) times daily. 03/19/18   Penumalli, Glenford Bayley, MD  loratadine (CLARITIN) 10 MG tablet Take 1 tablet (10 mg total) by mouth daily.  04/11/18   Tillman Sers, DO    Family History Family History  Problem Relation Age of Onset  . Asthma Mother   . Hypertension Father   . Sickle cell trait Father   . Bipolar disorder Father   . Asthma Sister   . Hypertension Maternal Grandmother   . Early death Neg Hx     Social History Social History   Tobacco Use  . Smoking status: Never Smoker  . Smokeless tobacco: Never Used  Substance Use Topics  . Alcohol use: Never    Frequency: Never  . Drug use: Never     Allergies   Patient has no known allergies.   Review of Systems Review of Systems  Constitutional: Negative for appetite change and fatigue.  HENT: Negative for congestion, ear discharge and sinus pressure.   Eyes: Negative for discharge.  Respiratory: Positive for cough.   Cardiovascular: Negative for chest pain.  Gastrointestinal: Negative for abdominal pain and diarrhea.  Genitourinary: Negative for frequency and hematuria.  Musculoskeletal: Negative for back pain.  Skin: Negative for rash.  Neurological: Positive for seizures. Negative for headaches.  Psychiatric/Behavioral: Negative for hallucinations.     Physical Exam Updated Vital Signs BP (!) 143/99   Pulse (!) 114   Resp 19   Ht 5\' 11"  (1.803 m)   Wt 106.8 kg   SpO2 100%   BMI 32.84 kg/m   Physical Exam  Constitutional: He is oriented to person, place, and time. He appears well-developed.  HENT:  Head: Normocephalic.  Eyes: Conjunctivae and EOM are normal. No scleral icterus.  Neck: Neck supple. No thyromegaly present.  Cardiovascular: Exam reveals no gallop and no friction rub.  No murmur heard. Tachycardic  Pulmonary/Chest: No stridor. He has no wheezes. He has rales. He exhibits no tenderness.  Tachypneic  Abdominal: He exhibits no distension. There is no tenderness. There is no rebound.  Musculoskeletal: Normal range of motion. He exhibits no edema.  Lymphadenopathy:    He has no cervical adenopathy.  Neurological:  He is oriented to person, place, and time. He exhibits normal muscle tone. Coordination normal.  Skin: No rash noted. No erythema.  Psychiatric: He has a normal mood and affect. His behavior is normal.     ED Treatments / Results  Labs (all labs ordered are listed, but only abnormal results are displayed) Labs Reviewed  CBC WITH DIFFERENTIAL/PLATELET  COMPREHENSIVE METABOLIC PANEL  I-STAT CHEM 8, ED    EKG None  Radiology Dg Chest Port 1 View  Result Date: 04/27/2018 CLINICAL DATA:  Shortness of breath hemoptysis EXAM: PORTABLE CHEST 1 VIEW COMPARISON:  03/11/2018, CT chest 04/09/2018, CT chest 01/14/2018 FINDINGS: Bilateral interstitial and ground-glass opacity in the left greater than right lung. Stable cardiomediastinal silhouette. No pneumothorax. IMPRESSION: Moderate bilateral interstitial and ground-glass opacity left greater than right, similar pattern on intermittent prior exams. Findings could be secondary to edema, pulmonary hemorrhage, ARDS, or inhalation injury. Electronically Signed   By: Jasmine Pang M.D.   On: 04/27/2018 20:38    Procedures Procedures (including critical care time)  Medications Ordered in ED Medications  famotidine (PEPCID) IVPB 20 mg premix (20 mg Intravenous New Bag/Given 04/27/18 2108)  levETIRAcetam (KEPPRA) 2,000 mg in sodium chloride 0.9 % 100 mL IVPB (has no administration in time range)  LORazepam (ATIVAN) injection 2 mg (has no administration in time range)  LORazepam (ATIVAN) injection 1 mg (1 mg Intramuscular Given 04/27/18 2017)  LORazepam (ATIVAN) 2 MG/ML injection (2 mg  Given 04/27/18 2042)     Initial Impression / Assessment and Plan / ED Course  I have reviewed the triage vital signs and the nursing notes.  Pertinent labs & imaging results that were available during my care of the patient were reviewed by me and considered in my medical decision making (see chart for details).     CRITICAL CARE Performed by: Bethann Berkshire Total critical care time 60 minutes Critical care time was exclusive of separately billable procedures and treating other patients. Critical care was necessary to treat or prevent imminent or life-threatening deterioration. Critical care was time spent personally by me on the following activities: development of treatment plan with patient and/or surrogate as well as nursing, discussions with consultants, evaluation of patient's response to treatment, examination of patient, obtaining history from patient or surrogate, ordering and performing treatments and interventions, ordering and review of laboratory studies, ordering and review of radiographic studies, pulse oximetry and re-evaluation of patient's condition. Patient was given Ativan 3 mg IM to calm it down so we could put an IV and draw blood.  Patient was hypoxic initially and has been put on nonrebreather.  His sats have been over 100%.  Patient may have aspirated.  Chest x-ray show infiltrates.  Critical care has been consulted and will see the patient Final Clinical Impressions(s) / ED Diagnoses   Final diagnoses:  None    ED Discharge Orders  None       Bethann Berkshire, MD 04/27/18 2121

## 2018-04-27 NOTE — Progress Notes (Signed)
ABG drawn, results given to MD. 

## 2018-04-27 NOTE — Sedation Documentation (Signed)
27 at the lip.

## 2018-04-27 NOTE — Sedation Documentation (Signed)
Patient intubated. Tube placed.

## 2018-04-27 NOTE — Procedures (Signed)
Intubation Procedure Note Billy Ayala [id number] 03/17/2001  Procedure: Intubation Indications: Respiratory insufficiency  Procedure Details Consent: Risks of procedure as well as the alternatives and risks of each were explained to the (patient/caregiver).  Consent for procedure obtained. Time Out: Verified patient identification, verified procedure, site/side was marked, verified correct patient position, special equipment/implants available, medications/allergies/relevent history reviewed, required imaging and test results available.  Performed  Maximum sterile technique was used including gloves, hand hygiene and mask.  MAC and 4 ETT size 8 -> initially 27 at lip post CXR moved to 25 RSI: Etomidate 20, Lidocaine 187m, Rocuronium 100 Fentanyl 50 mg post intubation.  Started on propofol gtt for continuous sedation   Evaluation Hemodynamic Status: BP stable throughout; O2 sats: stable throughout Patient's Current Condition: stable Complications: No apparent complications Patient did tolerate procedure well. Chest X-ray ordered to verify placement.  CXR: tube position low-repostitioned.   Billy Ayala 04/27/2018

## 2018-04-27 NOTE — ED Triage Notes (Signed)
Patient arrived via ems from home, had witnessed seizure during dinner that lasted 30-45 sec. Hx of previous seizures, autism. Compliant with Keppra at home. Patient is combative. Patient was vomiting blood upon arrival. Placed on nonrebreather, postictal upon arrival.

## 2018-04-27 NOTE — H&P (Addendum)
NAME:  Billy Ayala, MRN:  [id number], DOB:  Jul 02, 2001, LOS: 0 ADMISSION DATE:  04/27/2018, CONSULTATION DATE:  10/25 REFERRING MD:  zavits , CHIEF COMPLAINT:  Acute hypoxic respiratory failure    Brief History   17 year old male w/ h/o seizures, DAH (of unclear etiology), had witnessed seizure 10/25 followed by presumed aspiration event. Coughing vs vomiting blood on arrival. Intubated for respiratory failure .   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 rate 19, height 5\' 11"  (1.803 m), weight 106.8 kg, SpO2 100 %.       No intake or output data in the 24 hours ending 04/27/18 2134 Filed Weights   04/27/18 2040  Weight: 106.8 kg    Examination: General: well nourished 17 year old male, currently in acute distress  HENT: NCAT MMM. No clear epistaxis or tongue lacerations  Lungs: diffuse rales, marked accessory use  Cardiovascular: tachy rrr w/out MRG  Abdomen: soft not tender + bowel sounds  Extremities: warm dry no edema brisk CR strong pulses  Neuro: awake, restless but oriented  GU: voids  Resolved Hospital Problem list     Assessment & Plan:   Acute hypoxic respiratory failure in setting of diffuse pulmonary infiltrates w/ probable hemoptysis  -ddx: recurrent DAH vs pulmonary edema following seizure. Doubt CAP given no issues prior to seizure.  PCXR personally reviewed:  diffuse bilateral infiltrates Plan Intubate/ventilate ARDS protocol PAD RAS goal -2 to -3 Sputum culture  Empiric Unasyn  High dose steroids 125 q6 Once stable assess for diuresis   SIRS risk of evolving sepsis following aspiration event Plan Sputum culture and abx as above Tele  Lactic acid  IVFs for now  May need central access   Seizure: Occurred almost exactly as last presentation. Seizure followed by hemoptysis and respiratory failure  Plan Cont keppra EEG  Will need to touch base w/ neuro if has another seizure or if EEG positive  Acute metabolic encephalopathy mostly a mix of post-ictal state, anxiety and hypoxia superimposed on his underlying autism  Plan PAD protocol RAS goal -2 to -3 Propofol and fent    Disposition / Summary of Today's Plan 04/27/18   Admit to ICU  rx as DAH and empiric aspiration precaution  eeg Cont keppra Will need to re-eval the [facility]      Diet: NPO Pain/Anxiety/Delirium protocol (if indicated): PAD ordered RAS 2-3 VAP protocol (if indicated): ordered  DVT prophylaxis: SCD GI prophylaxis: H2B Hyperglycemia protocol: SSI Mobility: BR Code Status: full code  Family Communication: updated   Labs   CBC: Recent Labs  Lab 04/27/18 2058 04/27/18 2104  WBC 13.3*  --   NEUTROABS 9.2*  --   HGB 16.2 17.7*  HCT 52.2* 52.0  MCV 76.0*  --   PLT 389  --     Basic Metabolic Panel: Recent Labs  Lab 04/27/18 2104  NA 141  K 3.9  CL 103  GLUCOSE 119*  BUN 10  CREATININE 1.10   GFR: Estimated Creatinine Clearance: 135.4 mL/min (by C-G formula based on SCr of 1.1 mg/dL). Recent Labs  Lab 04/27/18 2058  WBC 13.3*    Liver Function Tests: No results for input(s): AST, ALT, ALKPHOS, BILITOT, PROT, ALBUMIN in the last 168 hours. No results for input(s): LIPASE, AMYLASE in the last 168 hours. No results for input(s): AMMONIA in the last 168 hours.  ABG    Component Value Date/Time   PHART 7.413 01/16/2018 0508   PCO2ART  34.3 01/16/2018 0508   PO2ART 80.1 (L) 01/16/2018 0508   HCO3 21.3 01/16/2018 0508   TCO2 28 04/27/2018 2104   ACIDBASEDEF 2.4 (H) 01/16/2018 0508   O2SAT 95.4 01/16/2018 0508     Coagulation Profile: No results for input(s): INR, PROTIME in the last 168 hours.  Cardiac Enzymes: No results for input(s): CKTOTAL, CKMB, CKMBINDEX, TROPONINI in the last 168 hours.  HbA1C: No results found for: HGBA1C  CBG: No results for input(s): GLUCAP in the last 168 hours.  Admitting History of Present Illness.   17 year old male w/ h/o autism, sickle cell trait, seizure disorder. We have seen him back in July 2019 and in follow up for hemoptysis felt 2/2 DAH. We saw him last in August as an out pt w/ a follow up CT scan that was clear. The plan was to see him on Oct 14th (this visit did not happen). He has since weaned off his prednisone. Was in usual state of health until 10/25. While eating his family heard a thud and found him seizing on the floor. This was reported as a generalized seizure lasting about 45 seconds. On arrival he was reported as vomiting and spitting blood. He was hypoxic and anxious. PCXR showed diffuse bilateral airspace disease PCCM was asked to admit   Review of Systems:   na  Past Medical History  He,  has a past medical history of Autism, Seizure (HCC), and Sickle cell trait (HCC).   Surgical History   No past surgical history on file.   Social History   Social History   Socioeconomic History  . Marital status: Single    Spouse name: Not on file  . Number of children: Not on file  . Years of education: Not on file  . Highest education level: Not on file  Occupational History  . Not on file  Social Needs  . Financial resource strain: Not on file  . Food insecurity:    Worry: Not on file    Inability: Not on file  . Transportation needs:    Medical: Not on file    Non-medical: Not on file  Tobacco Use  . Smoking status: Never Smoker  . Smokeless tobacco:  Never Used  Substance and Sexual Activity  . Alcohol use: Never    Frequency: Never  . Drug use: Never  . Sexual activity: Not on file  Lifestyle  . Physical activity:    Days per week: Not on file    Minutes per session: Not on file  . Stress: Not on file  Relationships  . Social connections:    Talks on phone: Not on file    Gets together: Not on file    Attends religious service: Not on file    Active member of club or organization: Not on file    Attends meetings of clubs or organizations: Not on file    Relationship status: Not  on file  . Intimate partner violence:    Fear of current or ex partner: Not on file    Emotionally abused: Not on file    Physically abused: Not on file    Forced sexual activity: Not on file  Other Topics Concern  . Not on file  Social History Narrative   Lives with mom, three half brothers and boyfriend      Goes to [employer] full school days (6th grader)   Right handed    No caffeine   ,  reports that he has never smoked. He has never used smokeless tobacco. He reports that he does not drink alcohol or use drugs.   Family History   His family history includes Asthma in his mother and sister; Bipolar disorder in his father; Hypertension in his father and maternal grandmother; Sickle cell trait in his father. There is no history of Early death.   Allergies No Known Allergies   Home Medications  Prior to Admission medications   Medication Sig Start Date End Date Taking? Authorizing Provider  levETIRAcetam (KEPPRA) 100 MG/ML solution Take 15 mLs (1,500 mg total) by mouth 2 (two) times daily. 03/19/18   Penumalli, Glenford Bayley, MD  loratadine (CLARITIN) 10 MG tablet Take 1 tablet (10 mg total) by mouth daily. 04/11/18   Tillman Sers, DO     Critical care time: 33 min     Simonne Martinet ACNP-BC [facility] Pulmonary/Critical Care Pager # [phone] OR # [phone] if no answer

## 2018-04-28 ENCOUNTER — Telehealth: Payer: Self-pay | Admitting: Neurology

## 2018-04-28 ENCOUNTER — Inpatient Hospital Stay (HOSPITAL_COMMUNITY): Payer: Medicaid Other

## 2018-04-28 LAB — POCT I-STAT 3, ART BLOOD GAS (G3+)
ACID-BASE DEFICIT: 2 mmol/L (ref 0.0–2.0)
Bicarbonate: 25 mmol/L (ref 20.0–28.0)
O2 SAT: 100 %
TCO2: 26 mmol/L (ref 22–32)
pCO2 arterial: 47.9 mmHg (ref 32.0–48.0)
pH, Arterial: 7.324 — ABNORMAL LOW (ref 7.350–7.450)
pO2, Arterial: 295 mmHg — ABNORMAL HIGH (ref 83.0–108.0)

## 2018-04-28 LAB — GLUCOSE, CAPILLARY
GLUCOSE-CAPILLARY: 116 mg/dL — AB (ref 70–99)
GLUCOSE-CAPILLARY: 116 mg/dL — AB (ref 70–99)
GLUCOSE-CAPILLARY: 131 mg/dL — AB (ref 70–99)
GLUCOSE-CAPILLARY: 133 mg/dL — AB (ref 70–99)
GLUCOSE-CAPILLARY: 133 mg/dL — AB (ref 70–99)
GLUCOSE-CAPILLARY: 148 mg/dL — AB (ref 70–99)
Glucose-Capillary: 127 mg/dL — ABNORMAL HIGH (ref 70–99)

## 2018-04-28 LAB — MAGNESIUM: Magnesium: 2.3 mg/dL (ref 1.7–2.4)

## 2018-04-28 LAB — PROCALCITONIN

## 2018-04-28 LAB — RAPID URINE DRUG SCREEN, HOSP PERFORMED
Amphetamines: NOT DETECTED
Barbiturates: NOT DETECTED
Benzodiazepines: NOT DETECTED
Cocaine: NOT DETECTED
OPIATES: NOT DETECTED
Tetrahydrocannabinol: NOT DETECTED

## 2018-04-28 LAB — BASIC METABOLIC PANEL
ANION GAP: 9 (ref 5–15)
BUN: 10 mg/dL (ref 6–20)
CALCIUM: 9.2 mg/dL (ref 8.9–10.3)
CO2: 23 mmol/L (ref 22–32)
Chloride: 106 mmol/L (ref 98–111)
Creatinine, Ser: 1.05 mg/dL (ref 0.61–1.24)
Glucose, Bld: 141 mg/dL — ABNORMAL HIGH (ref 70–99)
Potassium: 4.9 mmol/L (ref 3.5–5.1)
Sodium: 138 mmol/L (ref 135–145)

## 2018-04-28 LAB — PHOSPHORUS: PHOSPHORUS: 2.8 mg/dL (ref 2.5–4.6)

## 2018-04-28 LAB — CORTISOL: Cortisol, Plasma: 17.1 ug/dL

## 2018-04-28 LAB — MRSA PCR SCREENING: MRSA by PCR: NEGATIVE

## 2018-04-28 MED ORDER — SODIUM CHLORIDE 0.9 % IV SOLN
INTRAVENOUS | Status: DC | PRN
  Administered 2018-04-28 – 2018-04-30 (×3): 500 mL via INTRAVENOUS

## 2018-04-28 MED ORDER — FENTANYL CITRATE (PF) 100 MCG/2ML IJ SOLN
100.0000 ug | INTRAMUSCULAR | Status: DC | PRN
  Administered 2018-04-28 – 2018-04-29 (×2): 100 ug via INTRAVENOUS
  Filled 2018-04-28 (×3): qty 2

## 2018-04-28 MED ORDER — CHLORHEXIDINE GLUCONATE 0.12% ORAL RINSE (MEDLINE KIT)
15.0000 mL | Freq: Two times a day (BID) | OROMUCOSAL | Status: DC
  Administered 2018-04-28 – 2018-05-02 (×9): 15 mL via OROMUCOSAL

## 2018-04-28 MED ORDER — VITAL HIGH PROTEIN PO LIQD
1000.0000 mL | ORAL | Status: DC
  Administered 2018-04-28 – 2018-05-01 (×6): 1000 mL

## 2018-04-28 MED ORDER — PROPOFOL 1000 MG/100ML IV EMUL
0.0000 ug/kg/min | INTRAVENOUS | Status: DC
  Administered 2018-04-28 – 2018-04-29 (×3): 50 ug/kg/min via INTRAVENOUS
  Filled 2018-04-28 (×5): qty 100

## 2018-04-28 MED ORDER — ORAL CARE MOUTH RINSE
15.0000 mL | OROMUCOSAL | Status: DC
  Administered 2018-04-28 – 2018-05-02 (×42): 15 mL via OROMUCOSAL

## 2018-04-28 NOTE — Telephone Encounter (Signed)
Patient mother called that he was admitted to hospital on Oct 25th after having a recurrent seizure.

## 2018-04-28 NOTE — Progress Notes (Addendum)
NAME:  Billy Ayala, MRN:  [id number], DOB:  08-01-2000, LOS: 1 ADMISSION DATE:  04/27/2018, CONSULTATION DATE:  10/25 REFERRING MD:  zavits , CHIEF COMPLAINT:  Acute hypoxic respiratory failure    Brief History   17 year old male w/ h/o seizures, hemoptysis (of unclear etiology), had witnessed seizure 10/25 followed by presumed aspiration event. Coughing vs vomiting blood on arrival. Intubated for respiratory failure with hypoxia.   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  Remains on 50 mcg propofol, intubated.  Continues to have bloody secretions.   Objective   Blood pressure (!) 99/52, pulse 96, temperature 98.6 F (37 C), temperature source Axillary, resp. rate 20, height '6\' 2"'  (1.88 m), weight 103.6 kg, SpO2 99 %.    Vent Mode: PRVC FiO2 (%):  [40 %-100 %] 40 % Set Rate:  [16 bmp] 16 bmp Vt Set:  [450 mL] 450 mL PEEP:  [8 cmH20] 8 cmH20 Plateau Pressure:  [16 cmH20-21 cmH20] 16 cmH20   Intake/Output Summary (Last 24 hours) at 04/28/2018 1012 Last data filed at 04/28/2018 0900 Gross per 24 hour  Intake 1618.12 ml  Output 2125 ml  Net -506.88 ml   Filed Weights   04/27/18 2040 04/28/18 0000  Weight: 106.8 kg 103.6 kg    Examination: General: young adult male lying in bed on vent, NAD HEENT: MM pink/moist, ETT Neuro: sedate CV: s1s2 rrr, no m/r/g PULM: even/non-labored, lungs bilaterally coarse  GI: soft, non-tender, bsx4 active  Extremities: warm/dry, no edema  Skin: no rashes or  lesions  Resolved Hospital Problem list     Assessment & Plan:   Acute hypoxic respiratory failure in setting of diffuse pulmonary infiltrates w/ probable hemoptysis  -ddx: recurrent DAH vs pulmonary edema following seizure. Doubt CAP given no issues prior to seizure.  P: PRVC 8 cc/kg  Wean PEEP/FiO2 for sats > 90% Solumedrol 112m IV Q6  Consider repeat FOB for DAH evaluation  Follow intermittent CXR  SIRS  -risk of evolving sepsis following aspiration event P: Follow cultures as above  Empiric unasyn  ICU monitoring   Seizure -occurred almost exactly as last presentation. Seizure followed by hemoptysis and respiratory failure  P: Continue keppra  Await EEG  Consider neuro evaluation if further seizures or abnormal EEG.  May need input just for repeat seizures at home.   Acute metabolic encephalopathy  -mostly a mix of post-ictal state, anxiety and hypoxia superimposed on his underlying autism  P: RASS Goal: -1  Propofol for sedation, PRN Fentanyl for pain   Daily WUA / SBT when able  Early PT efforts when able   At Risk Malnutrition  P: TF per nutrition    Disposition / Summary of Today's Plan 04/28/18   Continue ICU care.  May need repeat FOB for possible DAH / airway evaluation.      Diet: NPO / TF Pain/Anxiety/Delirium protocol (if indicated): Propofol with PRN fentanyl  VAP protocol (if indicated): ordered  DVT prophylaxis:  SCD GI prophylaxis: H2B Hyperglycemia protocol: SSI Mobility: BR Code Status: full code  Family Communication: No family at bedside am 10/26.    Labs   CBC: Recent Labs  Lab 04/27/18 2058 04/27/18 2104  WBC 13.3*  --   NEUTROABS 9.2*  --   HGB 16.2 17.7*  HCT 52.2* 52.0  MCV 76.0*  --   PLT 389  --     Basic Metabolic Panel: Recent Labs  Lab 04/27/18 2058 04/27/18 2104 04/27/18 2227 04/28/18 0652  NA 140 141  --  138  K 4.2 3.9  --  4.9  CL 100 103  --  106  CO2 26  --   --  23  GLUCOSE 119* 119*  --  141*    BUN 9 10  --  10  CREATININE 1.09 1.10  --  1.05  CALCIUM 10.2  --   --  9.2  MG  --   --  2.3 2.3  PHOS  --   --  3.6 2.8   GFR: Estimated Creatinine Clearance: 146.5 mL/min (by C-G formula based on SCr of 1.05 mg/dL). Recent Labs  Lab 04/27/18 2058 04/27/18 2227  PROCALCITON  --  <0.10  WBC 13.3*  --   LATICACIDVEN  --  2.6*    Liver Function Tests: Recent Labs  Lab 04/27/18 2058  AST 32  ALT 25  ALKPHOS 103  BILITOT 0.5  PROT 7.3  ALBUMIN 3.9   No results for input(s): LIPASE, AMYLASE in the last 168 hours. No results for input(s): AMMONIA in the last 168 hours.  ABG    Component Value Date/Time   PHART 7.324 (L) 04/28/2018 0410   PCO2ART 47.9 04/28/2018 0410   PO2ART 295.0 (H) 04/28/2018 0410   HCO3 25.0 04/28/2018 0410   TCO2 26 04/28/2018 0410   ACIDBASEDEF 2.0 04/28/2018 0410   O2SAT 100.0 04/28/2018 0410     Coagulation Profile: No results for input(s): INR, PROTIME in the last 168 hours.  Cardiac Enzymes: No results for input(s): CKTOTAL, CKMB, CKMBINDEX, TROPONINI in the last 168 hours.  HbA1C: No results found for: HGBA1C  CBG: Recent Labs  Lab 04/28/18 0027 04/28/18 0335 04/28/18 0823  GLUCAP 116* 116* 148*    Critical care time:        Noe Gens, NP-C [place] [facility] Pgr: [phone] or if no answer [phone] 04/28/2018, 10:13 AM   Attending:  Note drug screen neg / need to confirm no vaping and check esr in am > ordered

## 2018-04-28 NOTE — Progress Notes (Signed)
Bedside EEG completed; results pending. 

## 2018-04-28 NOTE — Procedures (Signed)
Date of recording 04/28/2018  Referring provider Zenia Resides  Reason for the study Seizure  Technical Digital EEG recording using 10-20 international electrode system  Description of the recording When awake posterior dominant rhythm is9-10Hz  symmetrical reactive and nonsustained Non REM stage II sleepwas not obtained Epileptiform features were not seen during this recording  Impression The EEG isnormal in awake and drowsy state only.

## 2018-04-28 NOTE — Progress Notes (Addendum)
Initial Nutrition Assessment  DOCUMENTATION CODES:   Not applicable  INTERVENTION:   -Vital High Protein @ 60 ml/hr (1440 ml) via OGT  Provides: 1440 kcals (2261 kcal with propofol), 126 grams protein, 1210 ml free water. Meets 100% of needs.   NUTRITION DIAGNOSIS:   Increased nutrient needs related to acute illness as evidenced by estimated needs.  GOAL:   Provide needs based on ASPEN/SCCM guidelines  MONITOR:   Diet advancement, Vent status, Labs, Weight trends, TF tolerance, I & O's  REASON FOR ASSESSMENT:   Consult Enteral/tube feeding initiation and management  ASSESSMENT:   Patient with PMH significant for Autism, sickle cell trait, and seizures. Presents this admission after witnessed seizure with presumed aspiration event. Admitted for acute respiratory failure strongly suspected to be secondary to [facility].    No family at bedside to provide nutrition history. Per charts records weight shows to be fairly stable around 229-235 lb. Per RN, pt with high rate of propofol due to agitation and pulling at EET. Discussed TF plan with RN.   Patient is currently intubated on ventilator support MV: 8.3 L/min Temp (24hrs), Avg:98.9 F (37.2 C), Min:98.4 F (36.9 C), Max:99.4 F (37.4 C) BP: 105/57 MAP: 72  Propofol: 31.1 ml/hr- provides 821 kcal   I/O: -625 ml since admit UOP: 1.9 L x 24 hrs   Medications reviewed and include: solu-medrol, NS @ 75 ml/hr, propofol Labs reviewed: ltyes WDL  NUTRITION - FOCUSED PHYSICAL EXAM:    Most Recent Value  Orbital Region  No depletion  Upper Arm Region  No depletion  Thoracic and Lumbar Region  Unable to assess  Buccal Region  No depletion  Temple Region  No depletion  Clavicle Bone Region  No depletion  Clavicle and Acromion Bone Region  No depletion  Scapular Bone Region  Unable to assess  Dorsal Hand  No depletion  Patellar Region  No depletion  Anterior Thigh Region  No depletion  Posterior Calf Region  No depletion   Edema (RD Assessment)  None     Diet Order:   Diet Order            Diet NPO time specified  Diet effective now              EDUCATION NEEDS:   Not appropriate for education at this time  Skin:  Skin Assessment: Reviewed RN Assessment  Last BM:  PTA  Height:   Ht Readings from Last 1 Encounters:  04/28/18 6\' 2"  (1.88 m) (95 %, Z= 1.60)*   * Growth percentiles are based on CDC (Boys, 2-20 Years) data.    Weight:   Wt Readings from Last 1 Encounters:  04/28/18 103.6 kg (98 %, Z= 2.07)*   * Growth percentiles are based on CDC (Boys, 2-20 Years) data.    Ideal Body Weight:  86.4 kg  BMI:  Body mass index is 29.32 kg/m.  Estimated Nutritional Needs:   Kcal:  2249 kcal  Protein:  125-145 grams  Fluid:  >/= 2 L/day  Vanessa Kick RD, LDN Clinical Nutrition Pager # - [phone]

## 2018-04-29 ENCOUNTER — Encounter (HOSPITAL_COMMUNITY): Payer: Self-pay | Admitting: Nurse Practitioner

## 2018-04-29 ENCOUNTER — Inpatient Hospital Stay (HOSPITAL_COMMUNITY): Payer: Medicaid Other

## 2018-04-29 LAB — URINE CULTURE: Culture: NO GROWTH

## 2018-04-29 LAB — BASIC METABOLIC PANEL WITH GFR
Anion gap: 7 (ref 5–15)
BUN: 15 mg/dL (ref 6–20)
CO2: 25 mmol/L (ref 22–32)
Calcium: 9 mg/dL (ref 8.9–10.3)
Chloride: 108 mmol/L (ref 98–111)
Creatinine, Ser: 0.86 mg/dL (ref 0.61–1.24)
GFR calc Af Amer: >60 mL/min
GFR calc non Af Amer: >60 mL/min
Glucose, Bld: 142 mg/dL — ABNORMAL HIGH (ref 70–99)
Potassium: 4.5 mmol/L (ref 3.5–5.1)
Sodium: 140 mmol/L (ref 135–145)

## 2018-04-29 LAB — GLUCOSE, CAPILLARY
GLUCOSE-CAPILLARY: 161 mg/dL — AB (ref 70–99)
GLUCOSE-CAPILLARY: 154 mg/dL — AB (ref 70–99)
Glucose-Capillary: 139 mg/dL — ABNORMAL HIGH (ref 70–99)
Glucose-Capillary: 141 mg/dL — ABNORMAL HIGH (ref 70–99)
Glucose-Capillary: 148 mg/dL — ABNORMAL HIGH (ref 70–99)
Glucose-Capillary: 140 mg/dL — ABNORMAL HIGH (ref 70–99)

## 2018-04-29 LAB — CBC
HCT: 39.8 % (ref 39.0–52.0)
Hemoglobin: 12.3 g/dL — ABNORMAL LOW (ref 13.0–17.0)
MCH: 23.3 pg — ABNORMAL LOW (ref 26.0–34.0)
MCHC: 30.9 g/dL (ref 30.0–36.0)
MCV: 75.2 fL — ABNORMAL LOW (ref 80.0–100.0)
Platelets: 358 K/uL (ref 150–400)
RBC: 5.29 MIL/uL (ref 4.22–5.81)
RDW: 15.2 % (ref 11.5–15.5)
WBC: 17.8 K/uL — ABNORMAL HIGH (ref 4.0–10.5)
nRBC: 0 % (ref 0.0–0.2)

## 2018-04-29 LAB — SEDIMENTATION RATE: Sed Rate: 4 mm/h (ref 0–16)

## 2018-04-29 LAB — BLOOD GAS, ARTERIAL
ACID-BASE EXCESS: 0.7 mmol/L (ref 0.0–2.0)
Bicarbonate: 25.8 mmol/L (ref 20.0–28.0)
DRAWN BY: 252031
FIO2: 40
MECHVT: 450 mL
O2 Saturation: 98.7 %
PEEP/CPAP: 8 cmH2O
PO2 ART: 132 mmHg — AB (ref 83.0–108.0)
Patient temperature: 98.6
RATE: 16 resp/min
pCO2 arterial: 48.5 mmHg — ABNORMAL HIGH (ref 32.0–48.0)
pH, Arterial: 7.345 — ABNORMAL LOW (ref 7.350–7.450)

## 2018-04-29 LAB — PHOSPHORUS: Phosphorus: 3.4 mg/dL (ref 2.5–4.6)

## 2018-04-29 LAB — MAGNESIUM: Magnesium: 2.2 mg/dL (ref 1.7–2.4)

## 2018-04-29 MED ORDER — MIDAZOLAM HCL 2 MG/2ML IJ SOLN
2.0000 mg | INTRAMUSCULAR | Status: DC | PRN
  Administered 2018-04-29 – 2018-05-02 (×12): 2 mg via INTRAVENOUS
  Filled 2018-04-29 (×12): qty 2

## 2018-04-29 MED ORDER — SODIUM CHLORIDE 0.9 % IV SOLN
100.0000 mg | Freq: Two times a day (BID) | INTRAVENOUS | Status: DC
  Administered 2018-04-29 – 2018-05-04 (×10): 100 mg via INTRAVENOUS
  Filled 2018-04-29 (×11): qty 10

## 2018-04-29 MED ORDER — METHYLPREDNISOLONE SODIUM SUCC 125 MG IJ SOLR
60.0000 mg | Freq: Three times a day (TID) | INTRAMUSCULAR | Status: DC
  Administered 2018-04-29 – 2018-04-30 (×3): 60 mg via INTRAVENOUS
  Filled 2018-04-29 (×4): qty 2

## 2018-04-29 MED ORDER — PROPOFOL 1000 MG/100ML IV EMUL
0.0000 ug/kg/min | INTRAVENOUS | Status: DC
  Administered 2018-04-29 – 2018-04-30 (×8): 50 ug/kg/min via INTRAVENOUS
  Administered 2018-04-30: 45 ug/kg/min via INTRAVENOUS
  Administered 2018-04-30 – 2018-05-01 (×10): 50 ug/kg/min via INTRAVENOUS
  Administered 2018-05-02: 40 ug/kg/min via INTRAVENOUS
  Administered 2018-05-02: 50 ug/kg/min via INTRAVENOUS
  Administered 2018-05-02: 30 ug/kg/min via INTRAVENOUS
  Filled 2018-04-29 (×11): qty 100
  Filled 2018-04-29: qty 200
  Filled 2018-04-29 (×10): qty 100

## 2018-04-29 NOTE — Consult Note (Addendum)
Neurology Consultation  Reason for Consult: Seizure  Referring Physician:  CC: Seizure   History is obtained from:  HPI: Billy Ayala is a 17 y.o. male with a known history of seizure started on AED 12/2017 (On Keppra 1500 mg twice daily), hemoptysis with (diffuse alveolar hemorrhage workup that was essentially negative,there remains unknown etiology of hemoptysis).  She was follow-up post previous hospitalized care with pulmonary and neurology with titration of steroids and AED therapy. Apparently he had at least 5 episodes of seizure with hemoptysis since he was diagnosed with the seizures wince June 2019.  He is now present with acute hypoxic respiratory failure suspected secondary to [facility].  Neurology is consulted to determine if that the sequela of DAH is leading to a decrease in seizure threshold, and medication/ AED recommendations    He does not have a seizure history, but does have a family history with his father, and his father sister noted for seizures.     ROS: Unable to obtain due to altered mental status. No family at bedside to assist with history   Past Medical History:  Diagnosis Date  . Autism   . Seizure ([place])   . Sickle cell trait (HCC)     Family History  Problem Relation Age of Onset  . Asthma Mother   . Hypertension Father   . Sickle cell trait Father   . Bipolar disorder Father   . Asthma Sister   . Hypertension Maternal Grandmother   . Early death Neg Hx     Social History:   reports that he has never smoked. He has never used smokeless tobacco. He reports that he does not drink alcohol or use drugs.  Medications  Current Facility-Administered Medications:  .  0.9 %  sodium chloride infusion, , Intravenous, Continuous, Erick Colace, NP, Last Rate: 75 mL/hr at 04/29/18 1400 .  0.9 %  sodium chloride infusion, , Intravenous, PRN, Scatliffe, Kristen D, MD, Last Rate: 10 mL/hr at 04/29/18 1400 .  ampicillin-sulbactam (UNASYN) 1.5 g in sodium  chloride 0.9 % 100 mL IVPB, 1.5 g, Intravenous, Q6H, Erick Colace, NP, Stopped at 04/29/18 1344 .  chlorhexidine gluconate (MEDLINE KIT) (PERIDEX) 0.12 % solution 15 mL, 15 mL, Mouth Rinse, BID, Scatliffe, Kristen D, MD, 15 mL at 04/29/18 0810 .  famotidine (PEPCID) IVPB 20 mg premix, 20 mg, Intravenous, Q12H, Erick Colace, NP, Stopped at 04/29/18 1117 .  feeding supplement (VITAL HIGH PROTEIN) liquid 1,000 mL, 1,000 mL, Per Tube, Q24H, Scatliffe, Kristen D, MD, 1,000 mL at 04/29/18 0630 .  fentaNYL (SUBLIMAZE) injection 100 mcg, 100 mcg, Intravenous, Q2H PRN, Erick Colace, NP, 100 mcg at 04/29/18 1314 .  insulin aspart (novoLOG) injection 0-15 Units, 0-15 Units, Subcutaneous, Q4H, Erick Colace, NP, 2 Units at 04/29/18 1314 .  levETIRAcetam (KEPPRA) IVPB 1500 mg/ 100 mL premix, 1,500 mg, Intravenous, Q12H, Erick Colace, NP, Stopped at 04/29/18 1058 .  MEDLINE mouth rinse, 15 mL, Mouth Rinse, 10 times per day, Scatliffe, Kristen D, MD, 15 mL at 04/29/18 1319 .  methylPREDNISolone sodium succinate (SOLU-MEDROL) 125 mg/2 mL injection 60 mg, 60 mg, Intravenous, Q8H, Ollis, Brandi L, NP, 60 mg at 04/29/18 1420 .  midazolam (VERSED) injection 2 mg, 2 mg, Intravenous, Q2H PRN, Ollis, Brandi L, NP .  ondansetron (ZOFRAN) injection 4 mg, 4 mg, Intravenous, Q6H PRN, Salvadore Dom E, NP .  propofol (DIPRIVAN) 1000 MG/100ML infusion, 0-50 mcg/kg/min, Intravenous, Continuous, Scatliffe, Kristen D, MD, Last Rate: 31.4  mL/hr at 04/29/18 1400, 50 mcg/kg/min at 04/29/18 1400 .  sennosides (SENOKOT) 8.8 MG/5ML syrup 5 mL, 5 mL, Per Tube, BID PRN, Erick Colace, NP   Exam: Current vital signs: BP (!) 106/49   Pulse 74   Temp 99 F (37.2 C) (Axillary)   Resp 16   Ht '6\' 2"'  (1.88 m)   Wt 104.5 kg   SpO2 99%   BMI 29.58 kg/m  Vital signs in last 24 hours: Temp:  [97.5 F (36.4 C)-99.1 F (37.3 C)] 99 F (37.2 C) (10/27 1200) Pulse Rate:  [71-117] 74 (10/27 1400) Resp:  [16-25] 16  (10/27 1400) BP: (95-117)/(44-65) 106/49 (10/27 1400) SpO2:  [97 %-100 %] 99 % (10/27 1400) FiO2 (%):  [40 %] 40 % (10/27 1214) Weight:  [104.5 kg] 104.5 kg (10/27 0230)  GENERAL: Intubated/ ETT, Sedated Rass -2 currently on Propofol 60 mcg/kg/min most recently received IV versed for agitation. HEENT: - Normocephalic and atraumatic, dry mm, no LN++, no Thyromegally LUNGS - Clear to auscultation bilaterally with no wheezes CV - S1S2 RRR, no m/r/g, equal pulses bilaterally. ABDOMEN - Soft, nontender Ext: warm, well perfused, intact peripheral pulses, edema, withdrawals with noxious stimuli    Labs I have reviewed labs in epic and the results pertinent to this consultation are: White blood cells concerning for leukocytosis 17.8- possible relation  To seizure   CBC    Component Value Date/Time   WBC 17.8 (H) 04/29/2018 0140   RBC 5.29 04/29/2018 0140   HGB 12.3 (L) 04/29/2018 0140   HCT 39.8 04/29/2018 0140   PLT 358 04/29/2018 0140   MCV 75.2 (L) 04/29/2018 0140   MCH 23.3 (L) 04/29/2018 0140   MCHC 30.9 04/29/2018 0140   RDW 15.2 04/29/2018 0140   LYMPHSABS 2.7 04/27/2018 2058   MONOABS 1.2 (H) 04/27/2018 2058   EOSABS 0.0 04/27/2018 2058   BASOSABS 0.0 04/27/2018 2058    CMP     Component Value Date/Time   NA 140 04/29/2018 0140   K 4.5 04/29/2018 0140   CL 108 04/29/2018 0140   CO2 25 04/29/2018 0140   GLUCOSE 142 (H) 04/29/2018 0140   BUN 15 04/29/2018 0140   CREATININE 0.86 04/29/2018 0140   CALCIUM 9.0 04/29/2018 0140   PROT 7.3 04/27/2018 2058   ALBUMIN 3.9 04/27/2018 2058   AST 32 04/27/2018 2058   ALT 25 04/27/2018 2058   ALKPHOS 103 04/27/2018 2058   BILITOT 0.5 04/27/2018 2058   GFRNONAA >60 04/29/2018 0140   GFRAA >60 04/29/2018 0140    Lipid Panel     Component Value Date/Time   TRIG 84 04/27/2018 2227     Imaging None specific to neurological workup at this time   Assessment: Seizure currently intubated  It is currently unknown if DAH is  the cause of seizures or seizures are the cause of hemoptysis.  Several titrations of Keppra up to max dose currently on 1500 mg BID- total of 3000 mg oral suspension at home now on IV while hospitalized Still having seizures.    Impression:Kasch Q Headley is a 17 y.o. male with a known new history of seizure started on AED 12/2017 (On Keppra 1500 mg twice daily), hemoptysis with (diffuse alveolar hemorrhage workup that was essentially negative,there remains unknown etiology of hemoptysis). He has been titrated up on his Keppra to max dosing, now needing another agent for seizure control.   Recommendations: Continue seizure protocol Continue dosing of Keppra 1500 mg BID Add Vimpat 100 mg  IV BID Currently on Propofol 60 mcg/kg/min- hopefull as Vimpat comes on board he could possibly move toward goal of titration down without agitation or seizure activity     NEUROHOSPITALIST ADDENDUM Performed a face to face diagnostic evaluation.   I have reviewed the contents of history and physical exam as documented by PA/ARNP/Resident and agree with above documentation.  I have discussed and formulated the above plan as documented. Edits to the note have been made as needed.   Patient currently intubated and on sedation, appears to be moving all 4 extremities. 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  Will Vimpat 100 mg twice daily to his seizure regimen of Keppra 1500 mg twice daily.  Defer to his outpatient neurologist consider long-term ambulatory EEG to see if he is having intermittent seizures.    Karena Addison Ilyanna Baillargeon MD [facility] [phone]   If 7pm to 7am, please call on call as listed on AMION.

## 2018-04-29 NOTE — Progress Notes (Addendum)
NAME:  Billy Ayala, MRN:  [id number], DOB:  [birth year], LOS: 2 ADMISSION DATE:  04/27/2018, CONSULTATION DATE:  10/25 REFERRING MD:  zavits , CHIEF COMPLAINT:  Acute hypoxic respiratory failure    Brief History   17 year old male w/ h/o seizures, hemoptysis (of unclear etiology), had witnessed seizure 10/25 followed by presumed aspiration event. He has had 5 similar episodes since June of 2019.  Negative UDS, negative auto-immue work up. Coughing vs vomiting blood on arrival. Intubated for respiratory failure with hypoxia in the setting of infiltrates/hemoptysis.  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  Remains on 50 mcg propofol, intubated.  Continues to have bloody secretions. Occasional break through agitation despite propofol.    Objective   Blood pressure (!) 98/46, pulse 75, temperature 98.9 F (37.2 C), temperature source Axillary, resp. rate 16, height 6\' 2"  (1.88 m), weight 104.5 kg, SpO2 98 %.    Vent Mode: PRVC FiO2 (%):  [40 %] 40 % Set Rate:  [16 bmp] 16 bmp Vt Set:  [450 mL] 450 mL PEEP:  [8 cmH20] 8 cmH20 Plateau Pressure:  [14 cmH20-25 cmH20] 16 cmH20   Intake/Output Summary (Last 24 hours) at 04/29/2018 0916 Last data filed at 04/29/2018 0800 Gross per 24 hour  Intake 4083.92 ml  Output 1495 ml  Net 2588.92 ml   Filed Weights   04/27/18 2040 04/28/18 0000 04/29/18 0230  Weight: 106.8 kg 103.6 kg 104.5 kg     Examination: General: young adult male lying in bed on vent, NAD HEENT: MM pink/moist, ETT Neuro: sedate CV: s1s2 rrr, no m/r/g PULM: even/non-labored, lungs bilaterally coarse  GI: soft, non-tender, bsx4 active  Extremities: warm/dry, no edema  Skin: no rashes or lesions  Resolved Hospital Problem list     Assessment & Plan:   Acute hypoxic respiratory failure in setting of diffuse pulmonary infiltrates w/ probable hemoptysis  -ddx: recurrent DAH vs pulmonary edema following seizure. Doubt CAP given no issues prior to seizure.  P: Continue PRVC 8 cc/kg  Wean PEEP / FiO2 for sats >90% CXR clearing > ? Steroids vs PEEP / time  Continue solumedrol, reduce dose to 60 mg IV Q8  Consider repeat FOB 10/28  Daily SBT / WUA   SIRS  -risk of evolving sepsis following aspiration event P: Follow cultures  Empric unasyn  Doubt infectious process   Seizure -occurred almost exactly as last presentation. Seizure followed by hemoptysis and respiratory failure  P: Continue keppra  EEG as above  Neurology consulted for evaluation of antiepileptic regimen > family administers meds / compliant.  Mother very involved in his care.  Suspect he may need a second agent.  Mother concerned for oversedation.    Autism  P: Supportive care   Acute metabolic encephalopathy  -mostly a mix of post-ictal state, anxiety and hypoxia superimposed on his underlying autism  P: RASS Goal: 0 to -  1  Propofol for sedation  PRN fentanyl for pain  PRN versed for breakthrough agitation  WUA/SBT daily  Early PT efforts   At Risk Malnutrition  P: Continue TF    Disposition / Summary of Today's Plan 04/29/18   Continue ICU care.  May need repeat FOB for possible DAH / airway evaluation.      Diet: NPO / TF Pain/Anxiety/Delirium protocol (if indicated): Propofol with PRN fentanyl  VAP protocol (if indicated): ordered  DVT prophylaxis: SCD GI prophylaxis: H2B Hyperglycemia protocol:  SSI Mobility: BR Code Status: full code  Family Communication: Mother updated on plan of care 10/27  Labs   CBC: Recent Labs  Lab 04/27/18 2058 04/27/18 2104 04/29/18 0140  WBC 13.3*  --  17.8*  NEUTROABS 9.2*  --   --   HGB 16.2 17.7* 12.3*  HCT 52.2* 52.0 39.8  MCV 76.0*  --  75.2*  PLT 389  --  358    Basic Metabolic Panel: Recent Labs  Lab 04/27/18 2058 04/27/18 2104 04/27/18 2227 04/28/18 0652 04/29/18 0140  NA 140 141  --  138 140  K 4.2 3.9  --  4.9 4.5  CL 100 103  --  106 108  CO2 26  --   --  23 25  GLUCOSE 119* 119*  --  141* 142*  BUN 9 10  --  10 15  CREATININE 1.09 1.10  --  1.05 0.86  CALCIUM 10.2  --   --  9.2 9.0  MG  --   --  2.3 2.3 2.2  PHOS  --   --  3.6 2.8 3.4   GFR: Estimated Creatinine Clearance: 179.5 mL/min (by C-G formula based on SCr of 0.86 mg/dL). Recent Labs  Lab 04/27/18 2058 04/27/18 2227 04/29/18 0140  PROCALCITON  --  <0.10  --   WBC 13.3*  --  17.8*  LATICACIDVEN  --  2.6*  --     Liver Function Tests: Recent Labs  Lab 04/27/18 2058  AST 32  ALT 25  ALKPHOS 103  BILITOT 0.5  PROT 7.3  ALBUMIN 3.9   No results for input(s): LIPASE, AMYLASE in the last 168 hours. No results for input(s): AMMONIA in the last 168 hours.  ABG    Component Value Date/Time   PHART 7.345 (L) 04/29/2018 0500   PCO2ART 48.5 (H) 04/29/2018 0500   PO2ART 132 (H) 04/29/2018 0500   HCO3 25.8 04/29/2018 0500   TCO2 26 04/28/2018 0410   ACIDBASEDEF 2.0 04/28/2018 0410   O2SAT 98.7 04/29/2018 0500     Coagulation Profile: No results for input(s): INR, PROTIME in the last 168 hours.  Cardiac Enzymes: No results for input(s): CKTOTAL, CKMB, CKMBINDEX, TROPONINI in the last 168 hours.  HbA1C: No results found for: HGBA1C  CBG: Recent Labs  Lab 04/28/18 1549 04/28/18 1939 04/28/18 2353 04/29/18 0349 04/29/18 0801  GLUCAP 127* 133* 131* 139* 141*    Critical care time: 30 minutes     Canary Brim, NP-C [place]  [facility] Pgr: [phone] or if no answer [phone] 04/29/2018, 9:16 AM

## 2018-04-30 ENCOUNTER — Inpatient Hospital Stay (HOSPITAL_COMMUNITY): Payer: Medicaid Other

## 2018-04-30 DIAGNOSIS — Z9911 Dependence on respirator [ventilator] status: Secondary | ICD-10-CM

## 2018-04-30 LAB — BASIC METABOLIC PANEL
ANION GAP: 6 (ref 5–15)
Anion gap: 5 (ref 5–15)
BUN: 18 mg/dL (ref 6–20)
BUN: 23 mg/dL — ABNORMAL HIGH (ref 6–20)
CHLORIDE: 110 mmol/L (ref 98–111)
CHLORIDE: 109 mmol/L (ref 98–111)
CO2: 27 mmol/L (ref 22–32)
CO2: 29 mmol/L (ref 22–32)
Calcium: 8.9 mg/dL (ref 8.9–10.3)
Calcium: 8.6 mg/dL — ABNORMAL LOW (ref 8.9–10.3)
Creatinine, Ser: 0.92 mg/dL (ref 0.61–1.24)
Creatinine, Ser: 0.86 mg/dL (ref 0.61–1.24)
GFR calc Af Amer: >60 mL/min (ref >60)
GFR calc Af Amer: >60 mL/min (ref >60)
GFR calc non Af Amer: >60 mL/min (ref >60)
GFR calc non Af Amer: >60 mL/min (ref >60)
GLUCOSE: 136 mg/dL — AB (ref 70–99)
Glucose, Bld: 145 mg/dL — ABNORMAL HIGH (ref 70–99)
POTASSIUM: 4.3 mmol/L (ref 3.5–5.1)
POTASSIUM: 3.7 mmol/L (ref 3.5–5.1)
Sodium: 142 mmol/L (ref 135–145)
Sodium: 144 mmol/L (ref 135–145)

## 2018-04-30 LAB — GLUCOSE, CAPILLARY
GLUCOSE-CAPILLARY: 139 mg/dL — AB (ref 70–99)
GLUCOSE-CAPILLARY: 140 mg/dL — AB (ref 70–99)
GLUCOSE-CAPILLARY: 145 mg/dL — AB (ref 70–99)
GLUCOSE-CAPILLARY: 152 mg/dL — AB (ref 70–99)
Glucose-Capillary: 149 mg/dL — ABNORMAL HIGH (ref 70–99)
Glucose-Capillary: 147 mg/dL — ABNORMAL HIGH (ref 70–99)

## 2018-04-30 LAB — CBC
HEMATOCRIT: 40 % (ref 39.0–52.0)
HEMOGLOBIN: 12.5 g/dL — AB (ref 13.0–17.0)
MCH: 23.8 pg — AB (ref 26.0–34.0)
MCHC: 31.3 g/dL (ref 30.0–36.0)
MCV: 76 fL — ABNORMAL LOW (ref 80.0–100.0)
Platelets: 350 10*3/uL (ref 150–400)
RBC: 5.26 MIL/uL (ref 4.22–5.81)
RDW: 15.6 % — ABNORMAL HIGH (ref 11.5–15.5)
WBC: 17.2 10*3/uL — ABNORMAL HIGH (ref 4.0–10.5)
nRBC: 0 % (ref 0.0–0.2)

## 2018-04-30 LAB — TRIGLYCERIDES: Triglycerides: 142 mg/dL (ref <150)

## 2018-04-30 MED ORDER — FAMOTIDINE 40 MG/5ML PO SUSR
20.0000 mg | Freq: Two times a day (BID) | ORAL | Status: DC
  Administered 2018-04-30 – 2018-05-01 (×3): 20 mg
  Filled 2018-04-30 (×4): qty 2.5

## 2018-04-30 MED ORDER — FUROSEMIDE 10 MG/ML IJ SOLN
20.0000 mg | Freq: Three times a day (TID) | INTRAMUSCULAR | Status: AC
  Administered 2018-04-30 – 2018-05-01 (×2): 20 mg via INTRAVENOUS
  Filled 2018-04-30 (×2): qty 2

## 2018-04-30 MED ORDER — SODIUM CHLORIDE 0.9 % IV SOLN
INTRAVENOUS | Status: DC | PRN
  Administered 2018-04-30: 1000 mL via INTRAVENOUS

## 2018-04-30 MED ORDER — METHYLPREDNISOLONE SODIUM SUCC 40 MG IJ SOLR
40.0000 mg | Freq: Every day | INTRAMUSCULAR | Status: DC
  Administered 2018-05-01: 40 mg via INTRAVENOUS
  Filled 2018-04-30: qty 1

## 2018-04-30 MED ORDER — FUROSEMIDE 10 MG/ML IJ SOLN
20.0000 mg | Freq: Two times a day (BID) | INTRAMUSCULAR | Status: DC
  Administered 2018-04-30 (×2): 20 mg via INTRAVENOUS
  Filled 2018-04-30 (×2): qty 2

## 2018-04-30 NOTE — Progress Notes (Signed)
eLink Physician-Brief Progress Note Patient Name: Billy Ayala DOB: Nov 25, 2000 MRN: [id number]   Date of Service  04/30/2018  HPI/Events of Note  Request to evaluate KUB for enteric tube placement. Enteric tube tip in mid stomach.   eICU Interventions  OK to use enteric tube for medications and nutrition as ordered.      Intervention Category Intermediate Interventions: Diagnostic test evaluation  Anthony Tamburo [place] 04/30/2018, 1:44 AM

## 2018-04-30 NOTE — Progress Notes (Signed)
Tube feeding stopped 04/29/18 at 2300.  OGT (placed 10/25) advanced 18cm per chest x-ray read on 10/27.  KUB obtained and reviewed by Dr. Arsenio Loader, tube feeds may be resumed. Vital high protein restarted at 63ml/hr to infuse total amount in 24hrs.

## 2018-04-30 NOTE — Progress Notes (Signed)
Subjective: No further seizures  Exam: Vitals:   04/30/18 1130 04/30/18 1200  BP: 118/79 (!) 109/59  Pulse: 62 (!) 52  Resp: 18 16  Temp:    SpO2: 100% 99%   Gen: In bed, intubated Resp: ventilated Abd: soft, nt  Neuro:Bolused with sedation just prior to my exam, per nursing was agitated and trying to get out of bed earlier.  MS: Does not open eyes  CN: PERRL, eyes midline Motor: withdraws to nox stim x 4.  Sensory: as above.   Pertinent Labs: BMP - unremarkable  Impression: 17 yo M with autism and seizures since June of this year.  Unfortunately, with each episode he has had alveolar hemorrhage, presumably from increased intrathoracic pressure due to the seizure. With no respiratory symptoms prior to seizures, hemorrhage -> hypoxia/seizure is less likely.   Recommendations: 1) continue keppra 1500mg  BID  2) continue lacosamide 100mg  BID(new) 3) will follow   Ritta Slot, MD [facility] [phone]  If 7pm- 7am, please page neurology on call as listed in AMION.

## 2018-04-30 NOTE — Progress Notes (Signed)
NAME:  Billy Ayala, MRN:  [id number], DOB:  August 02, 2000, LOS: 3 ADMISSION DATE:  04/27/2018, CONSULTATION DATE:  10/25 REFERRING MD:  zavits , CHIEF COMPLAINT:  Acute hypoxic respiratory failure    Brief History   16 year old male w/ h/o seizures, hemoptysis (of unclear etiology), had witnessed seizure 10/25 followed by presumed aspiration event. He has had 5 similar episodes since June of 2019.  Negative UDS, negative auto-immue work up. Coughing vs vomiting blood on arrival. Intubated for respiratory failure with hypoxia in the setting of infiltrates/hemoptysis.  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 Seen by neuro yesterday. Added vimpat. Sedated RASS -4 currrent. Labile with sedation. Awakens very aggressive (hx of autism).   Objective   Blood pressure 122/82, pulse 60, temperature (P) 98.6 F (37 C), temperature source (P) Axillary, resp. rate 20, height 6\' 2"  (1.88 m), weight 104.9 kg, SpO2 100 %.    Vent Mode: PRVC FiO2 (%):  [40 %] 40 % Set Rate:  [16 bmp] 16 bmp Vt Set:  [450 mL] 450 mL PEEP:  [8 cmH20] 8 cmH20 Plateau Pressure:  [15 cmH20-20 cmH20] 20 cmH20   Intake/Output Summary (Last 24 hours) at 04/30/2018 1049 Last data filed at 04/30/2018 1024 Gross per 24 hour  Intake 5491.77 ml  Output 1450 ml  Net 4041.77 ml   Filed Weights   04/28/18 0000 04/29/18 0230 04/30/18 0400  Weight: 103.6 kg 104.5 kg 104.9 kg    Examination: General  appearance: 17 y.o., male, intubated on mech ventilation, RASS -4 Eyes: anicteric sclerae, pupils reactive  HENT: NCAT; oropharynx, ETT in place  Neck: Trachea midline; no JVD  Lungs: CTAB, no crackles, no wheeze, with normal respiratory effort and no intercostal retractions, BL vented breath sounds  CV: RRR, S1, S2, no MRGs  Abdomen: Soft, non-tender; non-distended, BS present  Extremities: No peripheral edema, radial and DP pulses present bilaterally  Skin: Normal temperature, turgor and texture; no rash Psych: unable to assess, no sign of active delirium  Neuro: RASS -4 currently, withdraws to pain, pupils reactive     Resolved Hospital Problem list     Assessment & Plan:   Acute hypoxic respiratory failure in setting of diffuse pulmonary infiltrates w/ hemoptysis from [facility], requiring intubation and mechanical ventilation   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 This is a documented entity in the medical literature associated with generalized tonic clonic seizure by mechanism of attempted respiration against a closed glottis or upper airway obstruction.  P: Continue current vent settings, no changes  Wean PEEP and FiO2, Sats >90%  Dose reduced solumedrol  CXR improved, no infiltrates  Not sure what a repeat FOB will help as the infiltrates are significantly improved  Would trachestomy be appropriate? However I think this is difficult in a patient with autism and probably not the best option  SIRS  -risk of evolving  sepsis following aspiration event Leukocytosis  - suspect related to seizure  P: Continue unasyn  Resp culture pending   Seizure -occurred almost exactly as last presentation. Seizure followed by hemoptysis and respiratory failure  P: Continue keppra and vimpat per neurology  Neuro will see today and speak with the mother   Autism  P: Supportive care  Will make liberation from the ventilator difficult   Acute  metabolic encephalopathy  -mostly a mix of post-ictal state, anxiety and hypoxia superimposed on his underlying autism  P: RASS goal 0--1 Propofol and fent for sedation  Renew restraint orders  Positive cumulative fluid balance P:  Will give some diuresis   At Risk Malnutrition  P: Continue TF at goal    Disposition / Summary of Today's Plan 04/30/18   Discussed with neuro, nursing and family.     Diet: NPO / TF Pain/Anxiety/Delirium protocol (if indicated): Propofol with PRN fentanyl  VAP protocol (if indicated): ordered  DVT prophylaxis: SCD GI prophylaxis: H2B Hyperglycemia protocol: SSI Mobility: BR Code Status: full code  Family Communication: mother updated at bedside, 04/30/2018 11:20 AM   Labs   CBC: Recent Labs  Lab 04/27/18 2058 04/27/18 2104 04/29/18 0140 04/30/18 0618  WBC 13.3*  --  17.8* 17.2*  NEUTROABS 9.2*  --   --   --   HGB 16.2 17.7* 12.3* 12.5*  HCT 52.2* 52.0 39.8 40.0  MCV 76.0*  --  75.2* 76.0*  PLT 389  --  358 350    Basic Metabolic Panel: Recent Labs  Lab 04/27/18 2058 04/27/18 2104 04/27/18 2227 04/28/18 0652 04/29/18 0140 04/30/18 0618  NA 140 141  --  138 140 142  K 4.2 3.9  --  4.9 4.5 4.3  CL 100 103  --  106 108 110  CO2 26  --   --  23 25 27   GLUCOSE 119* 119*  --  141* 142* 145*  BUN 9 10  --  10 15 18   CREATININE 1.09 1.10  --  1.05 0.86 0.92  CALCIUM 10.2  --   --  9.2 9.0 8.9  MG  --   --  2.3 2.3 2.2  --   PHOS  --   --  3.6 2.8 3.4  --    GFR: Estimated Creatinine Clearance: 168.2 mL/min (by C-G formula based on SCr of 0.92 mg/dL). Recent Labs  Lab 04/27/18 2058 04/27/18 2227 04/29/18 0140 04/30/18 0618  PROCALCITON  --  <0.10  --   --   WBC 13.3*  --  17.8* 17.2*  LATICACIDVEN  --  2.6*  --   --     Liver Function Tests: Recent Labs  Lab 04/27/18 2058  AST 32  ALT 25  ALKPHOS 103  BILITOT 0.5  PROT 7.3  ALBUMIN 3.9   No results for input(s): LIPASE, AMYLASE in the last 168 hours. No  results for input(s): AMMONIA in the last 168 hours.  ABG    Component Value Date/Time   PHART 7.345 (L) 04/29/2018 0500   PCO2ART 48.5 (H) 04/29/2018 0500   PO2ART 132 (H) 04/29/2018 0500   HCO3 25.8 04/29/2018 0500   TCO2 26 04/28/2018 0410   ACIDBASEDEF 2.0 04/28/2018 0410   O2SAT 98.7 04/29/2018 0500     Coagulation Profile: No results for input(s): INR, PROTIME in the last 168 hours.  Cardiac Enzymes: No results for input(s): CKTOTAL, CKMB, CKMBINDEX, TROPONINI in the last 168 hours.  HbA1C: No results found for: HGBA1C  CBG: Recent Labs  Lab 04/29/18 1632 04/29/18 1940 04/29/18 2323 04/30/18 0340 04/30/18 0812  GLUCAP 161* 154* 140* 145* 149*    This patient is critically ill with multiple organ system failure; which, requires frequent high complexity decision making, assessment, support, evaluation, and titration of therapies.  Care discussed and coordinated with neurology service Dr. Amada Jupiter.   This was completed through the application of advanced monitoring technologies and extensive interpretation of multiple databases. During this encounter critical care time was devoted to patient care services described in this note for 36 minutes.   Josephine Igo, DO [place] 04/30/2018 10:50 AM  Personal pager: [phone] If unanswered, please page CCM On-call: [phone]

## 2018-05-01 ENCOUNTER — Inpatient Hospital Stay (HOSPITAL_COMMUNITY): Payer: Medicaid Other

## 2018-05-01 LAB — GLUCOSE, CAPILLARY
GLUCOSE-CAPILLARY: 112 mg/dL — AB (ref 70–99)
GLUCOSE-CAPILLARY: 130 mg/dL — AB (ref 70–99)
GLUCOSE-CAPILLARY: 89 mg/dL (ref 70–99)
GLUCOSE-CAPILLARY: 99 mg/dL (ref 70–99)
Glucose-Capillary: 85 mg/dL (ref 70–99)
Glucose-Capillary: 117 mg/dL — ABNORMAL HIGH (ref 70–99)

## 2018-05-01 LAB — BASIC METABOLIC PANEL
Anion gap: 9 (ref 5–15)
Anion gap: 10 (ref 5–15)
BUN: 24 mg/dL — ABNORMAL HIGH (ref 6–20)
BUN: 21 mg/dL — AB (ref 6–20)
CALCIUM: 8.9 mg/dL (ref 8.9–10.3)
CO2: 30 mmol/L (ref 22–32)
CO2: 31 mmol/L (ref 22–32)
CREATININE: 0.93 mg/dL (ref 0.61–1.24)
CREATININE: 0.82 mg/dL (ref 0.61–1.24)
Calcium: 9.1 mg/dL (ref 8.9–10.3)
Chloride: 105 mmol/L (ref 98–111)
Chloride: 108 mmol/L (ref 98–111)
GFR calc non Af Amer: >60 mL/min (ref >60)
Glucose, Bld: 103 mg/dL — ABNORMAL HIGH (ref 70–99)
Glucose, Bld: 107 mg/dL — ABNORMAL HIGH (ref 70–99)
Potassium: 4.3 mmol/L (ref 3.5–5.1)
Potassium: 4.5 mmol/L (ref 3.5–5.1)
SODIUM: 144 mmol/L (ref 135–145)
SODIUM: 149 mmol/L — AB (ref 135–145)

## 2018-05-01 LAB — CBC WITH DIFFERENTIAL/PLATELET
Abs Immature Granulocytes: 0.06 10*3/uL (ref 0.00–0.07)
Basophils Absolute: 0 10*3/uL (ref 0.0–0.1)
Basophils Relative: 0 %
EOS ABS: 0 10*3/uL (ref 0.0–0.5)
EOS PCT: 0 %
HEMATOCRIT: 43.7 % (ref 39.0–52.0)
Hemoglobin: 13.2 g/dL (ref 13.0–17.0)
Immature Granulocytes: 0 %
Lymphocytes Relative: 26 %
Lymphs Abs: 3.5 10*3/uL (ref 0.7–4.0)
MCH: 23 pg — AB (ref 26.0–34.0)
MCHC: 30.2 g/dL (ref 30.0–36.0)
MCV: 76.3 fL — ABNORMAL LOW (ref 80.0–100.0)
Monocytes Absolute: 2.2 10*3/uL — ABNORMAL HIGH (ref 0.1–1.0)
Monocytes Relative: 16 %
NRBC: 0.1 % (ref 0.0–0.2)
Neutro Abs: 7.7 10*3/uL (ref 1.7–7.7)
Neutrophils Relative %: 58 %
Platelets: 375 10*3/uL (ref 150–400)
RBC: 5.73 MIL/uL (ref 4.22–5.81)
RDW: 15.5 % (ref 11.5–15.5)
WBC: 13.5 10*3/uL — AB (ref 4.0–10.5)

## 2018-05-01 LAB — MAGNESIUM: MAGNESIUM: 2.2 mg/dL (ref 1.7–2.4)

## 2018-05-01 LAB — CULTURE, RESPIRATORY: CULTURE: NORMAL

## 2018-05-01 LAB — CULTURE, RESPIRATORY W GRAM STAIN

## 2018-05-01 MED ORDER — FUROSEMIDE 10 MG/ML IJ SOLN
20.0000 mg | Freq: Three times a day (TID) | INTRAMUSCULAR | Status: AC
  Administered 2018-05-01 (×2): 20 mg via INTRAVENOUS
  Filled 2018-05-01 (×2): qty 2

## 2018-05-01 MED ORDER — PREDNISONE 20 MG PO TABS
20.0000 mg | ORAL_TABLET | Freq: Every day | ORAL | Status: AC
  Administered 2018-05-02 – 2018-05-04 (×3): 20 mg via ORAL
  Filled 2018-05-01 (×3): qty 1

## 2018-05-01 NOTE — Progress Notes (Signed)
RT called to patient's room to extubate patient. Upon RT arrival patient only had an O2 sat of 89-90 on 30% FIO2, RR was up to 60, and HR in the 120's. MD was called back into the room and the decision was made to keep patient intubated.

## 2018-05-01 NOTE — Progress Notes (Signed)
NAME:  Billy Ayala, MRN:  [id number], DOB:  2001/06/09, LOS: 4 ADMISSION DATE:  04/27/2018, CONSULTATION DATE:  10/25 REFERRING MD:  zavits , CHIEF COMPLAINT:  Acute hypoxic respiratory failure    Brief History   17 year old male w/ h/o seizures, hemoptysis (of unclear etiology), had witnessed seizure 10/25 followed by presumed aspiration event. He has had 5 similar episodes since June of 2019.  Negative UDS, negative auto-immue work up. Coughing vs vomiting blood on arrival. Intubated for respiratory failure with hypoxia in the setting of infiltrates/hemoptysis.  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 Good output. Attempted decreasing sedation. sats dropped. Unable to tolerate SBT,SAT this AM   Objective   Blood pressure 113/72, pulse (!) 50, temperature 98.6 F (37 C), temperature source Oral, resp. rate 16, height 6\' 2"  (1.88 m), weight 112.9 kg, SpO2 100 %.    Vent Mode: PRVC FiO2 (%):  [30 %-40 %] 30 % Set Rate:  [16 bmp] 16 bmp Vt Set:  [450 mL] 450 mL PEEP:  [8 cmH20] 8 cmH20 Plateau Pressure:  [12 cmH20-21 cmH20] 16 cmH20   Intake/Output Summary (Last 24 hours) at 05/01/2018 1042 Last data filed at 05/01/2018 4098 Gross per 24 hour  Intake 1783.67 ml  Output 5325 ml  Net -3541.33 ml   Filed Weights   04/29/18 0230 04/30/18 0400 05/01/18 0317  Weight: 104.5 kg 104.9 kg 112.9 kg    Examination: General appearance: 17 y.o., male, resting  comfortable on vent, easily aroused (non verbal at base line)  Eyes: anicteric sclerae, tracking  HENT: NCAT; oropharynx, MMM, no mucosal ulcerations; normal hard and soft palate Neck: Trachea midline; no JVD  Lungs: anterior rhonchi  CV: RRR, S1, S2, no MRGs  Abdomen: Soft, non-tender; non-distended, BS present  Extremities: No peripheral edema, radial and DP pulses present bilaterally  Skin: Normal temperature, turgor and texture; no rash Psych: Appropriate affect Neuro: Alert and oriented to person and place, no focal deficit   Resolved Hospital Problem list     Assessment & Plan:   Acute hypoxic respiratory failure in setting of diffuse pulmonary infiltrates w/ hemoptysis from [facility], requiring intubation and mechanical ventilation   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  In 10/5 the patient had several desaturations and become very agitated. We will attempt to wean PEEP and FiO2 to maintain sats greater than 90% Reduced Solu-Medrol dosing yesterday We will taper prednisone quickly as I do not believe this is a immunologic etiology.  SIRS response secondary to aspiration secondary to above, seizure Leukocytosis  - suspect related to seizure  P: Continue Unasyn to complete 5 days  Seizure -occurred almost exactly as last presentation. Seizure followed by hemoptysis and respiratory failure  P: Continue Vimpat and Keppra per  neurology May need to consider evaluation for seizure surgery in the outpatient setting  Autism  P: Patient is nonverbal at baseline but will follow basic commands. This will definitely make liberation from the ventilator more complex He will need a sitter upon extubation He has a history of running from the hospital and trying to leave his room.  Acute metabolic  encephalopathy  -mostly a mix of post-ictal state, anxiety and hypoxia superimposed on his underlying autism  P: Continue restraints, Sedation with propofol and fentanyl  Positive cumulative fluid balance P:  Given a few additional doses of diuresis Maintain euvolemia  At Risk Malnutrition  P:  Continue goal tube feed rate   Disposition / Summary of Today's Plan 05/01/18   Possible extubation tomorrow, will attempt SBT SAT again tomorrow. Attempted SBT SAT today and failed     Diet: NPO / TF Pain/Anxiety/Delirium protocol (if indicated): Propofol with PRN fentanyl  VAP protocol (if indicated): ordered  DVT prophylaxis: SCD GI prophylaxis: H2B Hyperglycemia protocol: SSI Mobility: BR Code Status: full code  Family Communication: called mother on the phone   Labs   CBC: Recent Labs  Lab 04/27/18 2058 04/27/18 2104 04/29/18 0140 04/30/18 0618 05/01/18 0546  WBC 13.3*  --  17.8* 17.2* 13.5*  NEUTROABS 9.2*  --   --   --  7.7  HGB 16.2 17.7* 12.3* 12.5* 13.2  HCT 52.2* 52.0 39.8 40.0 43.7  MCV 76.0*  --  75.2* 76.0* 76.3*  PLT 389  --  358 350 375    Basic Metabolic Panel: Recent Labs  Lab 04/27/18 2227 04/28/18 0652 04/29/18 0140 04/30/18 0618 04/30/18 2211 05/01/18 0546  NA  --  138 140 142 144 144  K  --  4.9 4.5 4.3 3.7 4.3  CL  --  106 108 110 109 105  CO2  --  23 25 27 29 30   GLUCOSE  --  141* 142* 145* 136* 103*  BUN  --  10 15 18  23* 24*  CREATININE  --  1.05 0.86 0.92 0.86 0.93  CALCIUM  --  9.2 9.0 8.9 8.6* 8.9  MG 2.3 2.3 2.2  --   --  2.2  PHOS 3.6 2.8 3.4  --   --   --    GFR: Estimated Creatinine Clearance: 172.2 mL/min (by C-G formula based on SCr of 0.93 mg/dL). Recent Labs  Lab 04/27/18 2058 04/27/18 2227 04/29/18 0140 04/30/18 0618 05/01/18 0546  PROCALCITON  --  <0.10  --   --   --   WBC 13.3*  --  17.8* 17.2* 13.5*  LATICACIDVEN  --  2.6*  --   --   --     Liver Function Tests: Recent Labs  Lab 04/27/18 2058  AST 32    ALT 25  ALKPHOS 103  BILITOT 0.5  PROT 7.3  ALBUMIN 3.9   No results for input(s): LIPASE, AMYLASE in the last 168 hours. No results for input(s): AMMONIA in the last 168 hours.  ABG    Component Value Date/Time   PHART 7.345 (L) 04/29/2018 0500   PCO2ART 48.5 (H) 04/29/2018 0500   PO2ART 132 (H) 04/29/2018 0500   HCO3 25.8 04/29/2018 0500   TCO2 26 04/28/2018 0410   ACIDBASEDEF 2.0 04/28/2018 0410   O2SAT 98.7 04/29/2018 0500     Coagulation Profile: No results for input(s): INR, PROTIME in the last 168 hours.  Cardiac Enzymes: No results for input(s): CKTOTAL, CKMB, CKMBINDEX, TROPONINI in the last 168  hours.  HbA1C: No results found for: HGBA1C  CBG: Recent Labs  Lab 04/30/18 1553 04/30/18 1937 04/30/18 2345 05/01/18 0345 05/01/18 0809  GLUCAP 139* 147* 140* 112* 85    This patient is critically ill with multiple organ system failure; which, requires frequent high complexity decision making, assessment, support, evaluation, and titration of therapies. This was completed through the application of advanced monitoring technologies and extensive interpretation of multiple databases. During this encounter critical care time was devoted to patient care services described in this note for 35 minutes.   Josephine Igo, DO [place] [facility] 05/01/2018 10:43 AM  Personal pager: [phone] If unanswered, please page CCM On-call: #[phone]

## 2018-05-01 NOTE — Progress Notes (Signed)
Subjective: No further seizures, agitated and requiring sedation  Exam: Vitals:   05/01/18 0900 05/01/18 1000  BP: 113/72 127/85  Pulse: (!) 50 64  Resp: 16 16  Temp:    SpO2: 100% 100%   Gen: In bed, intubated Resp: ventilated Abd: soft, nt  Neuro: MS: Opens eyes to nox stim, does not follow commands.  CN: PERRL, fixates and tracks Motor: withdraws to nox stim x 4.  Sensory: as above.    Impression: 17 yo M with autism and seizures since June of this year.  Unfortunately, with multiple episodes he has had alveolar hemorrhage, presumably from increased intrathoracic pressure due to the seizure. With no respiratory symptoms prior to seizures, hemorrhage -> hypoxia/seizure is less likely.   Recommendations: 1) continue keppra 1500mg  BID  2) continue lacosamide 100mg  BID(new) 3) will follow   Ritta Slot, MD [facility] [phone]  If 7pm- 7am, please page neurology on call as listed in AMION.

## 2018-05-02 ENCOUNTER — Ambulatory Visit: Payer: Medicaid Other | Admitting: Pulmonary Disease

## 2018-05-02 LAB — GLUCOSE, CAPILLARY
GLUCOSE-CAPILLARY: 100 mg/dL — AB (ref 70–99)
Glucose-Capillary: 89 mg/dL (ref 70–99)

## 2018-05-02 LAB — CULTURE, BLOOD (ROUTINE X 2)
CULTURE: NO GROWTH
Culture: NO GROWTH

## 2018-05-02 MED ORDER — FAMOTIDINE IN NACL 20-0.9 MG/50ML-% IV SOLN: 20.0000 mg | Freq: Two times a day (BID) | INTRAVENOUS | Status: DC

## 2018-05-02 MED ORDER — SENNOSIDES 8.8 MG/5ML PO SYRP
5.0000 mL | ORAL_SOLUTION | Freq: Two times a day (BID) | ORAL | Status: DC | PRN
  Filled 2018-05-02: qty 5

## 2018-05-02 NOTE — Progress Notes (Signed)
Subjective: No further seizures, agitated and requiring sedation until extubated this AM, now back to baseline.   Exam: Vitals:   05/02/18 0900 05/02/18 1000  BP: 124/81 138/78  Pulse: 72 79  Resp: (!) 21 (!) 21  Temp:    SpO2: 100% 96%   Gen: In bed, sitting with tablet and headaphones.  Resp: Non-labored breathing.  Abd: soft, nt  Neuro: MS: Awake, cooperative.  CN: PERRL, fixates and tracks Motor: withdraws to stim x 4.  Sensory: as above.    Impression: 17 yo M with autism and seizures since June of this year. Now back to baseline.   Unfortunately, with multiple episodes he has had alveolar hemorrhage, presumably from increased intrathoracic pressure due to the seizure. With no respiratory symptoms prior to seizures, hemorrhage -> hypoxia/seizure is less likely.   Recommendations: 1) continue keppra 1500mg  BID  2) continue lacosamide 100mg  BID(new) 3) F/u with outpatient neurology.   Ritta Slot, MD [facility] [phone]  If 7pm- 7am, please page neurology on call as listed in AMION.

## 2018-05-02 NOTE — Progress Notes (Signed)
Contacted by CCM about patient.  Received sign out that patient has recurrent admissions for seizures with hemoptysis.  Was noted to have a seizure on 10/25 with presumed aspirational event.  He was coughing versus vomiting blood when he arrived.  Patient was intubated.  He was started on a new seizure medicine per neurology, lacosamide 100 mg twice daily in conjunction with his home Keppra 1500 mg twice daily.  The patient was extubated today.  CCM notes that they believe that his alveolar hemorrhages are likely caused by negative pressure pulmonary edema during seizures.  CCM also reports that patient has not had a seizure since 10/25.  Family medicine will be happy to assume care of this patient on 10/31 at 7 AM.  We appreciate excellent care provided by pulmonary/critical care service.  Luis Abed, D.O.  PGY-1 Family Medicine  05/02/2018 4:41 PM

## 2018-05-02 NOTE — Progress Notes (Signed)
NAME:  Billy Ayala, MRN:  [id number], DOB:  07/01/2001, LOS: 5 ADMISSION DATE:  04/27/2018, CONSULTATION DATE:  10/25 REFERRING MD:  zavits , CHIEF COMPLAINT:  Acute hypoxic respiratory failure    Brief History   17 year old male w/ h/o seizures, hemoptysis (of unclear etiology), had witnessed seizure 10/25 followed by presumed aspiration event. He has had 5 similar episodes since June of 2019.  Negative UDS, negative auto-immue work up. Coughing vs vomiting blood on arrival. Intubated for respiratory failure with hypoxia in the setting of infiltrates/hemoptysis.  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), seizure d/o, autism   Significant Hospital Events   10/25 admitted w/ acute hypoxic resp failure, after a seizure,  followed by coughing up blood    Consults: date of consult/date signed off & final recs:  Neurology   Procedures (surgical and bedside):  ETT 10/25 >> 10/30  Significant Diagnostic Tests:  EEG 10/25 >> negative  UDS 10/26 >> negative  Sed Rate 10/27 >> 4   Micro Data:  Sputum 10/25 >> normal flora BC X 2 10/25 >> ngtd UC 10/25 >> negative  Antimicrobials:  Unasyn 10/25  >> 10/30 - completion of 5 days  Subjective:  Extubated to [place] this morning Hemodynamically stable  Objective   Blood pressure 103/66, pulse 79, temperature 98.7 F (37.1 C), temperature source Axillary, resp. rate (!) 27, height 6\' 2"  (1.88 m), weight 112.9 kg, SpO2 100 %.    Vent Mode: PRVC FiO2 (%):  [30 %] 30 % Set Rate:  [16 bmp] 16 bmp Vt Set:  [450 mL] 450 mL PEEP:  [8 cmH20] 8 cmH20 Plateau Pressure:  [14 cmH20-21 cmH20] 21 cmH20   Intake/Output Summary (Last 24 hours) at 05/02/2018 0935 Last data filed at 05/02/2018 0736 Gross per 24 hour  Intake 2098.14 ml  Output 3875 ml  Net -1776.86 ml   Filed Weights   04/29/18 0230 04/30/18 0400 05/01/18 0317  Weight: 104.5 kg 104.9 kg 112.9 kg    Examination: General:  Young adult male  sitting in bed in NAD, smiling HEENT: MM pink/moist, pupils 3/reactive, anicteric  Neuro: Awake, mildly drowsy, f/c intermittent, MAE, vocalizes name CV: SR, no murmur PULM: even/non-labored, lungs bilaterally clear GI: soft, non-tender, +BS  Extremities: warm/dry, no edema  Skin: no rashes  Sitter at bedside.  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 Seizure followed by hemoptysis and respiratory failure  P: Continue Vimpat and Keppra per neurology May need to consider evaluation for seizure surgery in the outpatient setting Seizure precautions  Autism  P: Patient is nonverbal at baseline but will follow basic commands Continue sitter at bedside given prior history of running from room/hospital  Acute metabolic encephalopathy - resolved -mostly a mix of post-ictal state, anxiety and hypoxia superimposed on his underlying autism  P: monitor  Positive cumulative fluid balance  P:  Goal is euvolemia  At Risk Malnutrition  P: NPO x 4 hours following extubation and then advance diet as tolerated  Disposition / Summary of Today's Plan 05/02/18   Extubated, if remains hemodynamically stable, can likely tx out of ICU this evening Sitter to remain at bedside.    Diet: NPO for  now Pain/Anxiety/Delirium protocol (if indicated): d/c VAP protocol (if indicated): n/a DVT prophylaxis: SCD GI prophylaxis: d/c Hyperglycemia protocol: d/c Mobility: BR Code Status: full code  Family Communication: Mother at bedside, updated on plan of care.   Labs   CBC: Recent Labs  Lab 04/27/18 2058 04/27/18 2104 04/29/18 0140 04/30/18 0618 05/01/18 0546  WBC 13.3*  --  17.8* 17.2* 13.5*  NEUTROABS 9.2*  --   --   --  7.7  HGB 16.2 17.7* 12.3* 12.5* 13.2  HCT 52.2* 52.0 39.8 40.0 43.7  MCV 76.0*  --  75.2* 76.0* 76.3*  PLT 389  --  358 350 375    Basic Metabolic Panel: Recent Labs  Lab 04/27/18 2227 04/28/18 0652 04/29/18 0140 04/30/18 0618 04/30/18 2211 05/01/18 0546 05/01/18 1828  NA  --  138 140 142 144 144 149*  K  --  4.9 4.5 4.3 3.7 4.3 4.5  CL  --  106 108 110 109 105 108  CO2  --  23 25 27 29 30 31   GLUCOSE  --  141* 142* 145* 136* 103* 107*  BUN  --  10 15 18  23* 24* 21*  CREATININE  --  1.05 0.86 0.92 0.86 0.93 0.82  CALCIUM  --  9.2 9.0 8.9 8.6* 8.9 9.1  MG 2.3 2.3 2.2  --   --  2.2  --   PHOS 3.6 2.8 3.4  --   --   --   --    GFR: Estimated Creatinine Clearance: 195.3 mL/min (by C-G formula based on SCr of 0.82 mg/dL). Recent Labs  Lab 04/27/18 2058 04/27/18 2227 04/29/18 0140 04/30/18 0618 05/01/18 0546  PROCALCITON  --  <0.10  --   --   --   WBC 13.3*  --  17.8* 17.2* 13.5*  LATICACIDVEN  --  2.6*  --   --   --     Liver Function Tests: Recent Labs  Lab 04/27/18 2058  AST 32  ALT 25  ALKPHOS 103  BILITOT 0.5  PROT 7.3  ALBUMIN 3.9   No results for input(s): LIPASE, AMYLASE in the last 168 hours. No results for input(s): AMMONIA in the last 168 hours.  ABG    Component Value Date/Time   PHART 7.345 (L) 04/29/2018 0500   PCO2ART 48.5 (H) 04/29/2018 0500   PO2ART 132 (H) 04/29/2018 0500   HCO3 25.8 04/29/2018 0500   TCO2 26 04/28/2018 0410   ACIDBASEDEF 2.0 04/28/2018 0410   O2SAT 98.7 04/29/2018 0500      Coagulation Profile: No results for input(s): INR, PROTIME in the last 168 hours.  Cardiac Enzymes: No results for input(s): CKTOTAL, CKMB, CKMBINDEX, TROPONINI in the last 168 hours.  HbA1C: No results found for: HGBA1C  CBG: Recent Labs  Lab 05/01/18 1544 05/01/18 1947 05/01/18 2344 05/02/18 0330 05/02/18 0805  GLUCAP 130* 99 89 100* 89    Posey Boyer, AGACNP-BC [facility] Pgr: [phone] or if no answer [phone] 05/02/2018, 9:48 AM

## 2018-05-02 NOTE — Progress Notes (Signed)
Patient admitted for acute respiratory failure and on seizure and aspiration precautions. Arrived into the unit at about 2120 from 4N.  Pt is alert and autistic1 unable to communicate effectively, but can answer yes/nno questions. Vital signs are stable. Will continue to monitor

## 2018-05-03 DIAGNOSIS — Z0189 Encounter for other specified special examinations: Secondary | ICD-10-CM

## 2018-05-03 LAB — CBC
HCT: 48.2 % (ref 39.0–52.0)
Hemoglobin: 14.9 g/dL (ref 13.0–17.0)
MCH: 23.2 pg — ABNORMAL LOW (ref 26.0–34.0)
MCHC: 30.9 g/dL (ref 30.0–36.0)
MCV: 75.2 fL — ABNORMAL LOW (ref 80.0–100.0)
NRBC: 0 % (ref 0.0–0.2)
PLATELETS: 416 10*3/uL — AB (ref 150–400)
RBC: 6.41 MIL/uL — AB (ref 4.22–5.81)
RDW: 15.1 % (ref 11.5–15.5)
WBC: 9.2 10*3/uL (ref 4.0–10.5)

## 2018-05-03 LAB — BASIC METABOLIC PANEL
Anion gap: 10 (ref 5–15)
BUN: 15 mg/dL (ref 6–20)
CALCIUM: 9.7 mg/dL (ref 8.9–10.3)
CO2: 30 mmol/L (ref 22–32)
Chloride: 101 mmol/L (ref 98–111)
Creatinine, Ser: 0.86 mg/dL (ref 0.61–1.24)
Glucose, Bld: 125 mg/dL — ABNORMAL HIGH (ref 70–99)
Potassium: 4.3 mmol/L (ref 3.5–5.1)
SODIUM: 141 mmol/L (ref 135–145)

## 2018-05-03 MED ORDER — NON FORMULARY: 5.0000 mg | Freq: Every evening | Status: DC | PRN

## 2018-05-03 MED ORDER — ENSURE ENLIVE PO LIQD
237.0000 mL | Freq: Two times a day (BID) | ORAL | Status: DC
  Administered 2018-05-04: 237 mL via ORAL

## 2018-05-03 MED ORDER — MELATONIN 3 MG PO TABS
6.0000 mg | ORAL_TABLET | Freq: Every evening | ORAL | Status: DC | PRN
  Administered 2018-05-03: 6 mg via ORAL
  Filled 2018-05-03 (×3): qty 2

## 2018-05-03 NOTE — Progress Notes (Signed)
Paged MD to order Melatonin for patient per request from his mother. Patient is not to be left alone at any time. If house does not send a sitter then we have to pull from our staff.

## 2018-05-03 NOTE — Discharge Summary (Signed)
[facility] Discharge Summary  Patient name: Billy Ayala Medical record number: [id number] Date of birth: [birth year] Age: 17 y.o. Gender: male Date of Admission: 04/27/2018  Date of Discharge: 05/04/18 Admitting Physician: Kandice Hams, MD  Primary Care Provider: Steve Rattler, DO Consultants: Neuro, CCM  Indication for Hospitalization: Seizure & Acute hypoxic respiratory failure   Discharge Diagnoses/Problem List:  Patient Active Problem List   Diagnosis Date Noted  . Impaired oral gastric feeding tube   . Acute respiratory failure ([place]) 04/27/2018  . Eczema 04/20/2018  . Hypoxia   . Seizure ([place]) 03/10/2018  . History of ETT   . Hb-SS disease without crisis ([place])   . Hemoptysis 01/14/2018  . Endotracheally intubated   . Seizure-like activity ([place]) 12/21/2017  . Diffuse pulmonary alveolar hemorrhage   . Obesity 08/25/2011  . Autism spectrum disorder 09/17/2007  . SICKLE CELL TRAIT 08/31/2006   Disposition: Home  Discharge Condition: improved and stable  Discharge Exam: Gen: NAD, alert, non-toxic, well-appearing, lying comfortably  Skin: Warm and dry. No obvious rashes, lesions, or trauma. HEENT: NCAT No conjunctival pallor or injection. No scleral icterus or injection.  MMM.  CV: RRR.  Normal S1-S2.  No BLEE. Resp: CTAB.  Mild and short end expiratory wheeze.  No increased WOB Abd: NTND on palpation to all 4 quadrants.  Positive bowel sounds. Psych: Cooperative with exam. Pleasant. Makes eye contact.  Limited verbal response to yes and no. Extremities: Moves all extremities spontaneously  Neuro: CN II-XII grossly intact. No FNDs.  Gait not assessed   Brief Hospital Course:  Billy Ayala is an 17 y.o. male who presented with acute hypoxic respiratory failure following seizure with hemoptysis.  He has a past medical history of seizures, diffuse pulmonary alveolar hemorrhage, autism, sickle cell trait.    His hospital course  is outlined below.  Acute hypoxic respiratory failure with hemoptysis He was admitted to the hospital following a witnessed seizure with presumed aspiration.  The patient was noted to be coughing and vomiting blood at the time.  He was admitted to the ICU and intubated.  On chest x-ray on arrival he was noted to have moderate bilateral interstitial and groundglass opacities left greater than right.  This was consistent with a pulmonary hemorrhage.  Pulmonology and that this was likely caused by negative pressure pulmonary edema induced diffuse alveolar hemorrhage secondary to his seizures.  He was treated with steroids while intubated.  He continued to improve and was able to be extubated on 10/30.  He was then transferred to the floor where his respiratory status remains stable.  He finished a taper of steroids on 11/1.  Following his extubation he was able to tolerate a p.o. diet without difficulty. Though he did not regain a full appetite, he was eating partial meals and taking adequate fluids.  At the time of discharge the patient's vital signs were stable he was satting well on room air and he had not had a seizure since 10/25.  SIRS secondary to Aspiration Patient met Sirs criteria on admission with a white blood cell count of 17.8.  He received a 5-day course of Unasyn while he was in the hospital which he completed on 10/30.  Patient remained afebrile and his WBC and overall status continued to improve.    Seizures Neurology was consulted given the patient's seizure being the likely cause of his diffuse alveolar hemorrhage.  Given that he was on max dose of Keppra at home, neurology  recommended adding Vimpat 100 mg IV twice daily.  He is tolerating both Vimpat and Keppra throughout his admission.  He had no repeat seizures.  His last noted seizure was 10/25.  He will follow-up with neurology as an outpatient.   Issues for Follow Up:  1. Patient was started on Vimpat during his hospitalization in  conjunction with his home Keppra, given failure of solo therapy at max dose.  He will need neurology follow up as an outpatient which has been scheduled prior to discharge  Significant Procedures: Intubation 10/25, Extubation 10/30  Significant Labs and Imaging:  Recent Labs  Lab 04/30/18 0618 05/01/18 0546 05/03/18 0955  WBC 17.2* 13.5* 9.2  HGB 12.5* 13.2 14.9  HCT 40.0 43.7 48.2  PLT 350 375 416*   Recent Labs  Lab 04/27/18 2058  04/27/18 2227 04/28/18 3300 04/29/18 0140 04/30/18 0618 04/30/18 2211 05/01/18 0546 05/01/18 1828 05/03/18 0955  NA 140   < >  --  138 140 142 144 144 149* 141  K 4.2   < >  --  4.9 4.5 4.3 3.7 4.3 4.5 4.3  CL 100   < >  --  106 108 110 109 105 108 101  CO2 26  --   --  '23 25 27 29 30 31 30  ' GLUCOSE 119*   < >  --  141* 142* 145* 136* 103* 107* 125*  BUN 9   < >  --  '10 15 18 ' 23* 24* 21* 15  CREATININE 1.09   < >  --  1.05 0.86 0.92 0.86 0.93 0.82 0.86  CALCIUM 10.2  --   --  9.2 9.0 8.9 8.6* 8.9 9.1 9.7  MG  --   --  2.3 2.3 2.2  --   --  2.2  --   --   PHOS  --   --  3.6 2.8 3.4  --   --   --   --   --   ALKPHOS 103  --   --   --   --   --   --   --   --   --   AST 32  --   --   --   --   --   --   --   --   --   ALT 25  --   --   --   --   --   --   --   --   --   ALBUMIN 3.9  --   --   --   --   --   --   --   --   --    < > = values in this interval not displayed.    Dg Chest Portable 1 View  Result Date: 04/27/2018 CLINICAL DATA:  Witnessed seizure. Vomiting blood. Post intubation. History of seizures. EXAM: PORTABLE CHEST 1 VIEW COMPARISON:  04/27/2018 chest radiograph. FINDINGS: Stable normal cardiac silhouette given projection and technique. Endotracheal tube tip projects 2 cm above the carina. Enteric tube tip extends below the field of view into the abdomen. Stable diffuse patchy opacities of the lungs. No pleural effusion or pneumothorax. Bones are unremarkable. IMPRESSION: Stable diffuse patchy opacities of the lungs.  Endotracheal tube tip 2 cm above the carina. Enteric tube tip below the field of view and abdomen. Electronically Signed   By: Kristine Garbe M.D.   On: 04/27/2018 22:36   Dg Chest [facility] 1 [address]  Result Date: 04/27/2018 CLINICAL DATA:  Shortness of breath hemoptysis EXAM: PORTABLE CHEST 1 VIEW COMPARISON:  03/11/2018, CT chest 04/09/2018, CT chest 01/14/2018 FINDINGS: Bilateral interstitial and ground-glass opacity in the left greater than right lung. Stable cardiomediastinal silhouette. No pneumothorax. IMPRESSION: Moderate bilateral interstitial and ground-glass opacity left greater than right, similar pattern on intermittent prior exams. Findings could be secondary to edema, pulmonary hemorrhage, ARDS, or inhalation injury. Electronically Signed   By: Donavan Foil M.D.   On: 04/27/2018 20:38   Results/Tests Pending at Time of Discharge: None  Discharge Medications:  Allergies as of 05/04/2018   No Known Allergies     Medication List    STOP taking these medications   loratadine 10 MG tablet Commonly known as:  CLARITIN     TAKE these medications   Lacosamide 100 MG Tabs Take 1 tablet (100 mg total) by mouth 2 (two) times daily.   levETIRAcetam 100 MG/ML solution Commonly known as:  KEPPRA Take 15 mLs (1,500 mg total) by mouth 2 (two) times daily.   ZZZQUIL PO Take 30 mLs by mouth at bedtime.       Discharge Instructions: Please refer to Patient Instructions section of EMR for full details.  Patient was counseled important signs and symptoms that should prompt return to medical care, changes in medications, dietary instructions, activity restrictions, and follow up appointments.   Follow-Up Appointments:   Future Appointments  Date Time Provider [place]  05/14/2018  1:00 PM Penni Bombard, MD [facility] None  05/15/2018  3:50 PM Trinna Post [facility]  09/03/2018  4:30 PM Penumalli, Earlean Polka, MD [facility] None     Wilber Oliphant,  MD 05/04/2018, 12:21 PM PGY-1, [place]

## 2018-05-03 NOTE — Discharge Instructions (Signed)
You were started on a new seizure medication that you should continue to take, as well as your Keppra.  If Billy Ayala is not to have bleeding after a seizure again, please bring him to the hospital.  Please go to your appointment with Dr. Wonda Olds for follow up on 11/12 at 3:50pm.

## 2018-05-03 NOTE — Plan of Care (Signed)
Patient is progressing. Cognitively impaired related to autism. Refused lab work earlier but had to hold him down with mother present. Unsteady, requires stand by assist to go to the bathroom. Safety sitter still required as patient gets confused and needs redirecting. Will continue to follow as needed.

## 2018-05-03 NOTE — Progress Notes (Signed)
Patient refused blood draw this morning, all effort to talk to patient was not effective. phlebotomist will come back later to try again.

## 2018-05-03 NOTE — Progress Notes (Signed)
Paged MD to let them know that patient did not eat but 25% of meal and is still not eating well. Dietary saw him and was going to give him an Ensure to see if he would drink that. Will continue to follow as needed.

## 2018-05-03 NOTE — Progress Notes (Addendum)
Family Medicine Teaching Service Daily Progress Note Intern Pager: [phone]  Patient name: Billy Ayala Medical record number: [id number] Date of birth: 07-05-2000 Age: 17 y.o. Gender: male  Primary Care Provider: Tillman Sers, DO Consultants: CCM, Neuro Code Status: Full  Pt Overview and Major Events to Date:  10/25 Admitted to ICU via CCM, intubated 10/30 Extubated 10/31 FPTS Assumes Care  Assessment and Plan: Billy Ayala is an 17 y.o. male who presented with acute hypoxic respiratory failure following seizure with hemoptysis.  He has a past medical history of seizures, diffuse pulmonary alveolar hemorrhage, autism, sickle cell trait.    Acute hypoxic respiratory failure with hemoptysis Patient extubated yesterday and moved from ICU to floor.  CCM believes because of hemoptysis during the seizure is likely negative pressure pulmonary edema induced diffuse alveolar hemorrhage secondary to seizures.  They have recommended a quick taper of prednisone, 3 days.  Lungs clear on exam.  Nurse reports that patient has not been drinking today. -Advance diet to regular as no aspiration risk noted, only aspiration during seizure -prednisone 20mg  QD, finish 11/1 -hopefully d/c home today pending ability to tolerate diet  SIRS secondary to aspiration: Resolved Leukocytosis on admission to 17.8>9.2 this AM. -S/P 5-day course of Unasyn, completed 10/30  Hypernatremia Na 149 on labs 10/29.  No intervention taken, no labs yesterday.  Na 141 this AM. -  No further intervention needed  Seizures Last noted seizure 10/25.  Neurology has been following and started Vimpat in conjunction with Keppra.  They also recommend follow-up with outpatient neurology. -Continue Keppra 1500 mg twice daily -Continue Vimpat 100 mg twice daily -Follow-up outpatient neurology  Autism Nonverbal at baseline, follows commands.  Refusing blood draws this morning.  Can be combative but has been  appropriate. -Continue sitter while inpatient  FEN/GI: Clear liquid, will advance as tolerated PPx: SCDs  Disposition: discharge home today  Subjective:  Patient sitting up and appears comfortable.  Nonverbal at baseline therefore unknown complaints.  Objective: Temp:  [98.4 F (36.9 C)-99.5 F (37.5 C)] 99.2 F (37.3 C) (10/31 0520) Pulse Rate:  [58-85] 72 (10/31 0520) Resp:  [18-27] 20 (10/31 0520) BP: (119-131)/(65-96) 122/82 (10/31 0520) SpO2:  [95 %-100 %] 100 % (10/31 0520)  Physical Exam: General: 17 y.o. male in NAD, sitting up in chair Cardio: RRR no m/r/g Lungs: CTAB, no wheezing, no rhonchi, no crackles, no increased work of breathing Abdomen: Soft, non-tender to palpation, positive bowel sounds Skin: warm and dry Extremities: No edema Neuro: grossly intact, cooperative with exam, follows commands, repeats some phrases said to him, does not answer questions, smiling   Laboratory: Recent Labs  Lab 04/30/18 0618 05/01/18 0546 05/03/18 0955  WBC 17.2* 13.5* 9.2  HGB 12.5* 13.2 14.9  HCT 40.0 43.7 48.2  PLT 350 375 416*   Recent Labs  Lab 04/27/18 2058  05/01/18 0546 05/01/18 1828 05/03/18 0955  NA 140   < > 144 149* 141  K 4.2   < > 4.3 4.5 4.3  CL 100   < > 105 108 101  CO2 26   < > 30 31 30   BUN 9   < > 24* 21* 15  CREATININE 1.09   < > 0.93 0.82 0.86  CALCIUM 10.2   < > 8.9 9.1 9.7  PROT 7.3  --   --   --   --   BILITOT 0.5  --   --   --   --   ALKPHOS 103  --   --   --   --  ALT 25  --   --   --   --   AST 32  --   --   --   --   GLUCOSE 119*   < > 103* 107* 125*   < > = values in this interval not displayed.     Imaging/Diagnostic Tests: No results found.  Billy Ayala, Billy Ice, DO 05/03/2018, 11:36 AM PGY-1, [facility] FPTS Intern pager: [phone], text pages welcome

## 2018-05-03 NOTE — Progress Notes (Addendum)
Nutrition Follow-up  INTERVENTION:   - Ensure Enlive po BID, each supplement provides 350 kcal and 20 grams of protein (chocolate or strawberry flavors)  NUTRITION DIAGNOSIS:   Increased nutrient needs related to acute illness as evidenced by estimated needs.  Ongoing  GOAL:   Patient will meet greater than or equal to 90% of their needs  Progressing  MONITOR:   PO intake, Supplement acceptance, Labs, Weight trends, I & O's  REASON FOR ASSESSMENT:   Consult Enteral/tube feeding initiation and management  ASSESSMENT:   Patient with PMH significant for Autism, sickle cell trait, and seizures. Presents this admission after witnessed seizure with presumed aspiration event. Admitted for acute respiratory failure strongly suspected to be secondary to [facility].   10/30 - extubated, diet advanced to clear liquids 10/31 - diet advanced to Regular  Noted current working diagnosis is negative pressure pulmonary edema induced DAH.  Noted weight fluctuations since admission. Admission weight recorded as 235 lbs, current weight 248 lbs. Suspect fluctuations related to scale error vs fluid status.  Spoke with pt and grandmother at bedside. Pt's mother on the phone during conversation.  Per pt's grandmother, pt just ate [place] from meal tray. Pt normally has a good appetite and eats well per pt's mother and grandmother. Pt's grandmother brought carrot cake to pt, and he ate ~50% of the cake slice. Pt's grandmother states that pt would normally consume 100% of cake slice when feeling well.  Alerted RN to pt's poor PO intake at lunch. Provided pt with a chocolate Ensure Enlive. Pt took a few sips during interview. Unsure if pt likes Ensure Enlive but would take sips of it when prompted. Pt's grandmother states that she will encourage pt to drink more of the supplement throughout the day. RD to order Ensure Enlive BID.  Meal Completion: 75% (clear liquids), 25% (regular)  Medications and labs  reviewed.  UOP: 1000 ml x 24 hours I/O's: +2.4 L since admit  Diet Order:   Diet Order            Diet regular Room service appropriate? Yes; Fluid consistency: Thin  Diet effective now              EDUCATION NEEDS:   No education needs have been identified at this time  Skin:  Skin Assessment: Reviewed RN Assessment  Last BM:  10/31 (medium type 5)  Height:   Ht Readings from Last 1 Encounters:  04/28/18 6\' 2"  (1.88 m) (95 %, Z= 1.60)*   * Growth percentiles are based on CDC (Boys, 2-20 Years) data.    Weight:   Wt Readings from Last 1 Encounters:  05/01/18 112.9 kg (>99 %, Z= 2.41)*   * Growth percentiles are based on CDC (Boys, 2-20 Years) data.    Ideal Body Weight:  86.4 kg  BMI:  Body mass index is 31.96 kg/m. >99th percentile = obesity  Estimated Nutritional Needs:   Kcal:  2400-2600  Protein:  120-130 grams  Fluid:  >/= 2.2 L    Earma Reading, MS, RD, LDN Inpatient Clinical Dietitian Pager: [phone] Weekend/After Hours: [phone]

## 2018-05-04 ENCOUNTER — Telehealth: Payer: Self-pay | Admitting: *Deleted

## 2018-05-04 DIAGNOSIS — T85598A Other mechanical complication of other gastrointestinal prosthetic devices, implants and grafts, initial encounter: Secondary | ICD-10-CM

## 2018-05-04 DIAGNOSIS — Z0189 Encounter for other specified special examinations: Secondary | ICD-10-CM

## 2018-05-04 MED ORDER — LACOSAMIDE 100 MG PO TABS: 100.0000 mg | ORAL_TABLET | Freq: Two times a day (BID) | ORAL | 0 refills | Status: DC

## 2018-05-04 MED FILL — VIMPAT 100 MG TABLET: 100 | 30 days supply | Qty: 60 | Fill #0

## 2018-05-04 NOTE — Evaluation (Signed)
Physical Therapy Evaluation Patient Details Name: Billy Ayala MRN: [id number] DOB: 2000-08-10 Today's Date: 05/04/2018   History of Present Illness  17yo male presenting with acute hypoxic respiratory failure following seizure with hemoptysis. Intubated on 04/27/18, extubated on 05/02/18. PMH seizure, pulmonary alveolar hemorrhage, autism, sickle cell trait   Clinical Impression  Patient received in bed, very pleasant and willing to participate in PT session; able to follow combination of verbal and visual cues well today. Unfortunately he is non-verbal and mom was not present to provide PLOF or equipment information, or express her concerns related to his mobility. He is able to complete bed mobility and transfers with Mod(I) with no device. Able to ambulate approximately 383f with no device and S-min guard; very distracted in busy hospital environment and sometimes becomes a bit unsteady when turning his head left and right, but able to maintain balance with min guard from PT. He was left up in the chair with all needs met, chair alarm active, RN present and attending. He will continue to benefit from skilled PT services in the acute setting as well as skilled OP PT services moving forward.     Follow Up Recommendations Outpatient PT    Equipment Recommendations  None recommended by PT    Recommendations for Other Services       Precautions / Restrictions Precautions Precautions: Fall;Other (comment) Precaution Comments: autistic  Restrictions Weight Bearing Restrictions: No      Mobility  Bed Mobility Overal bed mobility: Modified Independent                Transfers Overall transfer level: Modified independent Equipment used: None                Ambulation/Gait Ambulation/Gait assistance: Min guard Gait Distance (Feet): 300 Feet Assistive device: None Gait Pattern/deviations: Step-through pattern;Drifts right/left     General Gait Details: gait  pattern WNL, he is very easily distracted and can become mildy unsteady when turning his head L/R, requiring min guard for safety  Stairs            Wheelchair Mobility    Modified Rankin (Stroke Patients Only)       Balance Overall balance assessment: Needs assistance Sitting-balance support: No upper extremity supported Sitting balance-Leahy Scale: Normal Sitting balance - Comments: puts on socks sitting at EOB without difficulty      Standing balance-Leahy Scale: Good Standing balance comment: balance generally WNL, very easily distracted in busy hospital environment and did become a little unsteady when walking/turning head                              Pertinent Vitals/Pain Pain Assessment: Faces Pain Score: 0-No pain Faces Pain Scale: No hurt Pain Intervention(s): Limited activity within patient's tolerance;Monitored during session    Home Living Family/patient expects to be discharged to:: Private residence Living Arrangements: Parent;Other relatives               Additional Comments: mom not present to give PLOF/equipment details and patient non-verbal     Prior Function Level of Independence: Independent         Comments: mom not present to give PLOF/equipment details and patient non-verbal      Hand Dominance        Extremity/Trunk Assessment   Upper Extremity Assessment Upper Extremity Assessment: Overall WFL for tasks assessed    Lower Extremity Assessment Lower Extremity Assessment: Overall WFL for tasks  assessed    Cervical / Trunk Assessment Cervical / Trunk Assessment: Normal  Communication   Communication: Expressive difficulties;Other (comment)(autistic )  Cognition Arousal/Alertness: Awake/alert Behavior During Therapy: WFL for tasks assessed/performed Overall Cognitive Status: History of cognitive impairments - at baseline                                 General Comments: autistic at baseline but  very pleasant and followed verbal commands with visual cues well at eval; loves computer screens and danced at nurses station because he saw so many of them!       General Comments      Exercises     Assessment/Plan    PT Assessment Patient needs continued PT services  PT Problem List Decreased safety awareness;Decreased coordination;Decreased balance       PT Treatment Interventions Therapeutic activities;Gait training;Therapeutic exercise;DME instruction;Patient/family education;Stair training;Balance training;Functional mobility training;Neuromuscular re-education    PT Goals (Current goals can be found in the Care Plan section)  Acute Rehab PT Goals PT Goal Formulation: Patient unable to participate in goal setting    Frequency Min 3X/week   Barriers to discharge        Co-evaluation               AM-PAC PT "6 Clicks" Daily Activity  Outcome Measure Difficulty turning over in bed (including adjusting bedclothes, sheets and blankets)?: None Difficulty moving from lying on back to sitting on the side of the bed? : None Difficulty sitting down on and standing up from a chair with arms (e.g., wheelchair, bedside commode, etc,.)?: None Help needed moving to and from a bed to chair (including a wheelchair)?: None Help needed walking in hospital room?: A Little Help needed climbing 3-5 steps with a railing? : A Little 6 Click Score: 22    End of Session   Activity Tolerance: Patient tolerated treatment well Patient left: in chair;with chair alarm set;with call bell/phone within reach;with nursing/sitter in room Nurse Communication: Mobility status PT Visit Diagnosis: Unsteadiness on feet (R26.81)    Time: 5364-6803 PT Time Calculation (min) (ACUTE ONLY): 15 min   Charges:   PT Evaluation $PT Eval Moderate Complexity: 1 Mod          Deniece Ree PT, DPT, CBIS  Supplemental Physical Therapist [place]    Pager [phone] Acute Rehab Office  [phone]

## 2018-05-04 NOTE — Progress Notes (Signed)
Patient alert with noted mental delay.  Sitter at bedside at start of shift, patient non-verbal but communicates with head nodding and gestures.  Oriented to room and situation, no behaviors noted.  Patient watching television for entertainment. Tolerated medications PO and peripheral with no distress.  New dose of melatonin aided in sleep, patient noted asleep for duration of shift.  Bedside sitter discontinued and new orders for telesitter obtained, however, no equipment currently available.  Patient remain calm upon waking, placed on low bed for safety.  Spoke with mother to provide update, she verbalized satisfaction with patients care and new orders for patient monitoring. She is currently in route to facility.  Stable condition at end of shift.  Will continue to monitor.

## 2018-05-04 NOTE — Telephone Encounter (Signed)
Received call form Dr Larey Brick, [facility] stating the patient is currently in house, re: seizures to be discharged soon. She is requesting a follow up with Dr Marjory Lies in 1-2 weeks. This RN scheduled FU on 05/14/18; Dr Larey Brick will notify the patient and his mother. Of note, the patient is now on Vimpat in addition to Keppra. Will let Dr Marjory Lies know.

## 2018-05-04 NOTE — Care Management Note (Signed)
Case Management Note  Patient Details  Name: Billy Ayala MRN: [id number] Date of Birth: 2001-06-12  Subjective/Objective:          Presented with acute hypoxic respiratory failure following seizure with hemoptysis. From home with mom. Annitta Jersey (Mother)     [phone]       Action/Plan: Transition to home. Pt to f/u with outpatient  PT,noted on AVS. Referral made per CM , office will call to arrange appointment time.  Mom to provide transportation to home.  Expected Discharge Date:  05/04/18               Expected Discharge Plan:  Home/Self Care  In-House Referral:     Discharge planning Services  CM Consult  Post Acute Care Choice:  NA Choice offered to:     DME Arranged:  N/A DME Agency:  NA  HH Arranged:  NA HH Agency:  NA  Status of Service:  Completed, signed off  If discussed at Long Length of Stay Meetings, dates discussed:    Additional Comments:  Epifanio Lesches, RN 05/04/2018, 4:59 PM

## 2018-05-04 NOTE — Progress Notes (Signed)
It was reported by patients mom that he was not eating and did not have an appetite.  I sat with him at lunch time and patient ate 100% of his lunch tray.

## 2018-05-04 NOTE — Progress Notes (Addendum)
Family Medicine Teaching Service Daily Progress Note Intern Pager: [phone]  Patient name: Billy Ayala Medical record number: [id number] Date of birth: [birth year] Age: 17 y.o. Gender: male  Primary Care Provider: Tillman Sers, DO Consultants: PT, Neuro, CCM Code Status: Full code  Pt Overview and Major Events to Date:  Admitted: 04/27/2018 Hospital Day: 8  10/25 Admitted to ICU via CCM, intubated 10/30 Extubated 10/31 FPTS Assumes Care  Assessment and Plan: Billy Ayala is an 17 y.o. male who presented with acute hypoxic respiratory failure following seizure with hemoptysis.  Patient was found to have Sirs secondary to aspiration which responded well to 5-day course of Unasyn. He has a past medical history of seizures, diffuse pulmonary alveolar hemorrhage, autism, sickle cell trait.    Acute hypoxic respiratory failure with hemoptysis  improved, stable, likely resolved Patient extubated and on floor.  Patient will finish a quick prednisone taper today.  Patient tolerated advanced diet well, though he was found to have a decreased appetite possibly due to sore throat after intubation.  Patient reports that he is breathing well today  Tolerating diet well  Improved  Seizures Patient has not had any seizures since admission.  Mom reports that seizures occur about once a month and seem to occur before his second dose of Keppra.  She reports good adherence and Keppra levels are consistent with this.  He was started on Vimpat on admission.  Neurology consulted  Continue Keppra and Vimpat  Outpatient follow-up with [facility] neurology, appointment already made  Disposition: Patient is stable for discharge from a medical standpoint  Mom expresses great concern this morning with discharge today for the following reasons: He was started on a new medication and has not been monitored while off sedation, he has an unsteady gait yesterday, he has a decreased appetite.  Spoke  to mom this morning about his expectations for discharge.    Mom was told that as patient is taking fluids well, this would be an appropriate criteria for discharge.  Decreased appetite is also likely associated with sore throat from intubation.  For his unsteady gait, PT was consulted.  Unsteady gait likely due to coming off of sedation and deconditioning during intubation.   Patient has been monitored for 24 hours on Vimpat  Autism Patient pleasant on exam this morning.  He is active and answers questions.  No lab draw this morning as lab draw previously has been traumatic.   Sitter  Resolved SIRS 2/2 aspiration Hypernatremia  resolved ( f/u BMP o/p)  Nutrition: PO DVT ppx: SCDs Disposition: home   Medications: Scheduled Meds: . feeding supplement (ENSURE ENLIVE)  237 mL Oral BID BM   Continuous Infusions: . sodium chloride Stopped (05/01/18 1014)  . lacosamide (VIMPAT) IV 100 mg (05/04/18 1001)  . levETIRAcetam 1,500 mg (05/04/18 0903)   PRN Meds: sodium chloride, Melatonin, ondansetron (ZOFRAN) IV, sennosides  ================================================= ================================================= Subjective:  Patient is mostly nonverbal but can respond to yes or no questions.  He reports that he is breathing well this morning.  He does not have any complaints this morning.  Mom is at bedside and expresses concern with his discharge today.  See plan for details.  Objective: Vital Signs Temp:  [98.3 F (36.8 C)-98.8 F (37.1 C)] 98.5 F (36.9 C) (11/01 0607) Pulse Rate:  [72-80] 72 (11/01 0607) Resp:  [12-18] 18 (11/01 0607) BP: (122-135)/(70-90) 122/70 (11/01 0607) SpO2:  [100 %] 100 % (11/01 1308)  Intake/Output 10/31 0701 - 11/01 0700 In:  1662.7 [P.O.:1200; IV Piggyback:462.7] Out: -   Physical Exam:  Gen: NAD, alert, non-toxic, well-appearing, lying comfortably  Skin: Warm and dry. No obvious rashes, lesions, or trauma. HEENT: NCAT No  conjunctival pallor or injection. No scleral icterus or injection.  MMM.  CV: RRR.  Normal S1-S2.  No BLEE. Resp: CTAB.  Mild and short end expiratory wheeze.  No increased WOB Abd: NTND on palpation to all 4 quadrants.  Positive bowel sounds. Psych: Cooperative with exam. Pleasant. Makes eye contact.  Limited verbal response to yes and no. Extremities: Moves all extremities spontaneously  Neuro: CN II-XII grossly intact. No FNDs.  Gait not assessed   Laboratory: Recent Labs  Lab 04/30/18 0618 05/01/18 0546 05/03/18 0955  WBC 17.2* 13.5* 9.2  HGB 12.5* 13.2 14.9  HCT 40.0 43.7 48.2  PLT 350 375 416*   Recent Labs  Lab 04/27/18 2058  05/01/18 0546 05/01/18 1828 05/03/18 0955  NA 140   < > 144 149* 141  K 4.2   < > 4.3 4.5 4.3  CL 100   < > 105 108 101  CO2 26   < > 30 31 30   BUN 9   < > 24* 21* 15  CREATININE 1.09   < > 0.93 0.82 0.86  CALCIUM 10.2   < > 8.9 9.1 9.7  PROT 7.3  --   --   --   --   BILITOT 0.5  --   --   --   --   ALKPHOS 103  --   --   --   --   ALT 25  --   --   --   --   AST 32  --   --   --   --   GLUCOSE 119*   < > 103* 107* 125*   < > = values in this interval not displayed.   Imaging/Diagnostic Tests: No results found.  Melene Plan, MD 05/04/2018, 12:06 PM PGY-1, [facility] Family Medicine FPTS Intern pager: [phone], text pages welcome

## 2018-05-04 NOTE — Telephone Encounter (Signed)
Agree. -VRP 

## 2018-05-04 NOTE — Progress Notes (Signed)
I got a call from the resident regarding Billy Ayala needing reassurance that Billy Ayala is ready for d/c home. She is concern about his appetite and gait and also the fact that he is on a new medication (Vimpat).  No acute findings on physical exam this morning and he seems to be doing well in general.  He has been on Vimpat for 5 days since 04/29/2018 without any side effects. Also stable on his Keppra dose.  Regarding his appetite, per nursing, he completed his meal this afternoon without difficulty. He will improve gradually on his appetite and hid diet can be closely monitored at home.  I did not see him ambulate in the morning. We consulted PT to evaluate him and he was cleared by PT. I also observed him walk with the nurse today at 4:40 PM with steady gait.  Outpatient PT f/u recommended. Order placed.  Medically he is cleared.  We contacted neuro to see if there is anything else to do for him while he is here. Per telephone documentation, Dr. Satira Sark is ok with outpatient neuro f/u. Per last Neurology documentation as of 05/02/18 outpatient neuro f/u recommended by Dr. Amada Jupiter.  Recommendations: 1) continue keppra 1500mg  BID  2) continue lacosamide 100mg  BID(new) 3) F/u with outpatient neurology.   Ritta Slot, MD [facility] [phone]  F/U appointment made for him with his PCP and neuro.  Please ensure that he gets his Vimpat from [facility] prior to d/c home today.     Vitals:   05/03/18 1603 05/03/18 2045 05/04/18 0607 05/04/18 1325  BP: 135/87 131/90 122/70 121/85  Pulse: 76 80 72 75  Resp: 18 12 18    Temp: 98.3 F (36.8 C) 98.8 F (37.1 C) 98.5 F (36.9 C) 98.5 F (36.9 C)  TempSrc: Oral Oral  Oral  SpO2: 100% 100% 100% 98%  Weight:      Height:

## 2018-05-04 NOTE — Progress Notes (Signed)
Arland Elton Sin to be D/C'd  per MD order.  Discussed with the patient mother Smith,[place] and all questions fully answered.  VSS, Skin clean, dry and intact without evidence of skin break down, no evidence of skin tears noted. IV catheter discontinued intact. Site without signs and symptoms of complications. Dressing and pressure applied.  An After Visit Summary was printed and given to the patient. Patient received prescription.  D/c education completed with patient/family including follow up instructions, medication list, d/c activities limitations if indicated, with other d/c instructions as indicated by MD - patient able to verbalize understanding, all questions fully answered.   Patient instructed to return to ED, call 911, or call MD for any changes in condition.   Patient escorted via WC, and D/C home via private auto.   Lavonna Rua 05/04/2018 6:50 PM

## 2018-05-10 ENCOUNTER — Other Ambulatory Visit: Payer: Self-pay | Admitting: Family Medicine

## 2018-05-14 ENCOUNTER — Ambulatory Visit (INDEPENDENT_AMBULATORY_CARE_PROVIDER_SITE_OTHER): Payer: Medicaid Other | Admitting: Diagnostic Neuroimaging

## 2018-05-14 ENCOUNTER — Encounter: Payer: Self-pay | Admitting: Diagnostic Neuroimaging

## 2018-05-14 VITALS — BP 138/81 | HR 90 | Ht 74.0 in | Wt 229.6 lb

## 2018-05-14 DIAGNOSIS — F840 Autistic disorder: Secondary | ICD-10-CM

## 2018-05-14 DIAGNOSIS — G40909 Epilepsy, unspecified, not intractable, without status epilepticus: Secondary | ICD-10-CM

## 2018-05-14 MED ORDER — LEVETIRACETAM 100 MG/ML PO SOLN: 1500.0000 mg | Freq: Two times a day (BID) | ORAL | 12 refills | Status: DC

## 2018-05-14 MED ORDER — LACOSAMIDE 100 MG PO TABS: 100.0000 mg | ORAL_TABLET | Freq: Two times a day (BID) | ORAL | 5 refills | Status: DC

## 2018-05-14 NOTE — Progress Notes (Signed)
[facility]  PATIENT: Billy Ayala DOB: 07/03/2001  REFERRING CLINICIAN: A Riccio, DO HISTORY FROM: mother, grandmother, chart review, patient REASON FOR VISIT: follow up   HISTORICAL  CHIEF COMPLAINT:  Chief Complaint  Patient presents with  . New Patient (Initial Visit)    Rm 7, mom  . Referred by [facility]    Seizure.  No seizures since on vimpat and keppra.    HISTORY OF PRESENT ILLNESS:   UPDATE (05/14/18, VRP): Since last visit, had another seizure and admission in oct 2019. Had aspiration, resp failure, hemoptysis, and ICU support. Now on vimpat + levetiracetam. Some decr appetite and energy. No more seizures.    UPDATE (03/19/18, VRP): Since last visit, had 2 more seizures. LEV has been increased. Last sz no 03/11/18. No alleviating or aggravating factors. Tolerating medication.   PRIOR HPI (02/27/18): 17 year old male here for evaluation of seizures.  History of autism.  June 2019 patient was at home, collapsed and had generalized convulsions.  Patient was taken to the emergency room and then began to cough and vomit blood.  Patient was also diagnosed with diffuse alveolar hemorrhage.  He was started on antiseizure medication.  No specific etiology for hemoptysis was found.  July 2019 patient returned to the hospital for another seizure.  Again patient began to have pulmonary edema and hemoptysis.  Patient was immediately started on steroids and antiseizure medication dose was increased.  Since that time patient has been doing well.  No further seizures or hemoptysis.  He has been following up with PCP and pulmonary clinic.  He has additional pulmonary testing scheduled for October 2019.  No prior history of seizures.  There is family history of seizure in patient's father and paternal aunt.  No side effects of antiseizure medication at this time.   REVIEW OF SYSTEMS: Full 14 system review of systems performed and negative with exception of: only as  per HPI.  ALLERGIES: No Known Allergies  HOME MEDICATIONS: Outpatient Medications Prior to Visit  Medication Sig Dispense Refill  . diphenhydrAMINE HCl, Sleep, (ZZZQUIL PO) Take 30 mLs by mouth at bedtime.    . Lacosamide (VIMPAT) 100 MG TABS Take 1 tablet (100 mg total) by mouth 2 (two) times daily. 60 tablet 0  . levETIRAcetam (KEPPRA) 100 MG/ML solution Take 15 mLs (1,500 mg total) by mouth 2 (two) times daily. 473 mL 12   No facility-administered medications prior to visit.     PAST MEDICAL HISTORY: Past Medical History:  Diagnosis Date  . Autism   . Seizure (HCC)   . Sickle cell trait (HCC)     PAST SURGICAL HISTORY: No past surgical history on file.  FAMILY HISTORY: Family History  Problem Relation Age of Onset  . Asthma Mother   . Hypertension Father   . Sickle cell trait Father   . Bipolar disorder Father   . Asthma Sister   . Hypertension Maternal Grandmother   . Early death Neg Hx     SOCIAL HISTORY: Social History   Socioeconomic History  . Marital status: Single    Spouse name: Not on file  . Number of children: Not on file  . Years of education: Not on file  . Highest education level: Not on file  Occupational History  . Not on file  Social Needs  . Financial resource strain: Not on file  . Food insecurity:    Worry: Not on file    Inability: Not on file  . Transportation needs:  Medical: Not on file    Non-medical: Not on file  Tobacco Use  . Smoking status: Never Smoker  . Smokeless tobacco: Never Used  Substance and Sexual Activity  . Alcohol use: Never    Frequency: Never  . Drug use: Never  . Sexual activity: Not on file  Lifestyle  . Physical activity:    Days per week: Not on file    Minutes per session: Not on file  . Stress: Not on file  Relationships  . Social connections:    Talks on phone: Not on file    Gets together: Not on file    Attends religious service: Not on file    Active member of club or organization:  Not on file    Attends meetings of clubs or organizations: Not on file    Relationship status: Not on file  . Intimate partner violence:    Fear of current or ex partner: Not on file    Emotionally abused: Not on file    Physically abused: Not on file    Forced sexual activity: Not on file  Other Topics Concern  . Not on file  Social History Narrative   Lives with mom, three half brothers and boyfriend      Goes to [employer] full school days (6th grader)   Right handed    No caffeine      PHYSICAL EXAM  GENERAL EXAM/CONSTITUTIONAL: Vitals:  Vitals:   05/14/18 1305  BP: 138/81  Pulse: 90  Weight: 229 lb 9.6 oz (104.1 kg)  Height: 6\' 2"  (1.88 m)   Body mass index is 29.48 kg/m. Wt Readings from Last 3 Encounters:  05/14/18 229 lb 9.6 oz (104.1 kg) (98 %, Z= 2.09)*  05/01/18 248 lb 14.4 oz (112.9 kg) (>99 %, Z= 2.41)*  03/19/18 235 lb 6.4 oz (106.8 kg) (99 %, Z= 2.19)*   * Growth percentiles are based on CDC (Boys, 2-20 Years) data.    Patient is in no distress; well developed, nourished and groomed; neck is supple  CARDIOVASCULAR:  Examination of carotid arteries is normal; no carotid bruits  Regular rate and rhythm, no murmurs  Examination of peripheral vascular system by observation and palpation is normal  EYES:  Ophthalmoscopic exam of optic discs and posterior segments is normal; no papilledema or hemorrhages No exam data present  MUSCULOSKELETAL:  Gait, strength, tone, movements noted in Neurologic exam below  NEUROLOGIC: MENTAL STATUS:  No flowsheet data found.  awake, alert, oriented to person  [facility] memory   normal attention and concentration  DECR FLUENCY; FOLLOW SIMPLE COMMANDS  fund of knowledge appropriate  CRANIAL NERVE:   2nd - no papilledema on fundoscopic exam  2nd, 3rd, 4th, 6th - pupils equal and reactive to light, visual fields full to confrontation, extraocular muscles intact, no nystagmus  5th - facial sensation  symmetric  7th - facial strength symmetric  8th - hearing intact  9th - palate elevates symmetrically, uvula midline  11th - shoulder shrug symmetric  12th - tongue protrusion midline  MOTOR:   normal bulk and tone, full strength in the BUE, BLE  SENSORY:   normal and symmetric to light touch  COORDINATION:   finger-nose-finger, fine finger movements normal  REFLEXES:   deep tendon reflexes present and symmetric  GAIT/STATION:   narrow based gait     DIAGNOSTIC DATA (LABS, IMAGING, TESTING) - I reviewed patient records, labs, notes, testing and imaging myself where available.  Lab Results  Component Value  Date   WBC 9.2 05/03/2018   HGB 14.9 05/03/2018   HCT 48.2 05/03/2018   MCV 75.2 (L) 05/03/2018   PLT 416 (H) 05/03/2018      Component Value Date/Time   NA 141 05/03/2018 0955   K 4.3 05/03/2018 0955   CL 101 05/03/2018 0955   CO2 30 05/03/2018 0955   GLUCOSE 125 (H) 05/03/2018 0955   BUN 15 05/03/2018 0955   CREATININE 0.86 05/03/2018 0955   CALCIUM 9.7 05/03/2018 0955   PROT 7.3 04/27/2018 2058   ALBUMIN 3.9 04/27/2018 2058   AST 32 04/27/2018 2058   ALT 25 04/27/2018 2058   ALKPHOS 103 04/27/2018 2058   BILITOT 0.5 04/27/2018 2058   GFRNONAA >60 05/03/2018 0955   GFRAA >60 05/03/2018 0955   Lab Results  Component Value Date   TRIG 142 04/30/2018   No results found for: HGBA1C Lab Results  Component Value Date   VITAMINB12 248 01/15/2018   Lab Results  Component Value Date   TSH 0.422 12/14/2017    12/13/17 EEG - This is a normal sleep/sedated EEG for the patients stated age.  There were no focal, hemispheric or lateralizing features.  No epileptiform activity was recorded.  Comment cannot be made on waking rhythm as the patient was sedated with Versed and Precedex.  A normal EEG does not exclude the diagnosis of a seizure disorder and if seizure remains high on the list of differential diagnosis, a repeat EEG when off of sedation may  be of value.  Correlate clinically.  12/13/17 MRI brain [I reviewed images myself and agree with interpretation. -VRP]  - Unremarkable appearance of the brain.    ASSESSMENT AND PLAN  17 y.o. year old male here with autism and seizure disorder.    Initial 2 seizures may have triggered pulmonary edema and hemoptysis. Other possibility is underlying pulmonary disease (autoimmune) leading to hypoxia and seizure secondarily.  I would favor that patient had seizure as the initial event for both admissions; this is based on history per family.   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 Seizure disorder (HCC)   2. Autism      PLAN:  - continue levetiracetam 1500mg  twice a day + vimpat 100mg  twice a day   - in future may consider increasing vimpat up to 200mg  twice a day; may also adding consider lamotrigine, topiramate, depakote, onfi or phenobarbital  - Please maintain precautions. Do not participate in activities where a loss of awareness could harm you or someone else. No swimming alone, no tub bathing, no hot tubs, no driving, no operating motorized vehicles (cars, ATVs, motocycles, etc), lawnmowers, power tools or firearms. No standing at heights, such as rooftops, ladders or stairs. Avoid hot objects such as stoves, heaters, open fires. Wear a helmet when riding a bicycle, scooter, skateboard, etc. and avoid areas of traffic. Set your water heater to 120 degrees or less.  Meds ordered this encounter  Medications  . levETIRAcetam (KEPPRA) 100 MG/ML solution    Sig: Take 15 mLs (1,500 mg total) by mouth 2 (two) times daily.    Dispense:  473 mL    Refill:  12  . Lacosamide (VIMPAT) 100 MG TABS    Sig: Take 1 tablet (100 mg total) by mouth 2 (two) times daily.    Dispense:  60 tablet    Refill:  5   Return in about 6 months (around 11/12/2018).    Suanne Marker, MD 05/14/2018, 1:26 PM Certified in  Neurology, Neurophysiology and Neuroimaging  [facility] [address] [place], [place] [address] [phone]

## 2018-05-15 ENCOUNTER — Encounter: Payer: Medicaid Other | Admitting: Family Medicine

## 2018-05-16 ENCOUNTER — Telehealth: Payer: Self-pay | Admitting: Diagnostic Neuroimaging

## 2018-05-16 MED ORDER — LACOSAMIDE 200 MG PO TABS: 200.0000 mg | ORAL_TABLET | Freq: Two times a day (BID) | ORAL | 5 refills | Status: DC

## 2018-05-16 NOTE — Telephone Encounter (Signed)
I spoke to mother of pt.  He had not had another seizure.  Would like to go ahead and increase vimpat to 200mg  po bid as discussed in ofv visit this last Monday.   [facility] [address]

## 2018-05-16 NOTE — Telephone Encounter (Signed)
Pts mother [name](on DPR) requesting a call stating she would like to discuss rasing the dosage for Lacosamide (VIMPAT) 100 MG TABS to 200MG  now instead of waiting. Please advise

## 2018-05-16 NOTE — Telephone Encounter (Signed)
Spoke to pts mother.  Relayed that prescription was sent to pharmacy, vimpat 200mg  po bid.  She verbalized understanding.

## 2018-05-16 NOTE — Telephone Encounter (Signed)
Meds ordered this encounter  Medications  . lacosamide (VIMPAT) 200 MG TABS tablet    Sig: Take 1 tablet (200 mg total) by mouth 2 (two) times daily.    Dispense:  60 tablet    Refill:  5   Suanne MarkerVIKRAM R. PENUMALLI, MD 05/16/2018, 4:20 PM Certified in Neurology, Neurophysiology and Neuroimaging  [facility] [address], [address] [place], [place] [address] [phone]

## 2018-05-17 ENCOUNTER — Encounter: Payer: Medicaid Other | Admitting: Family Medicine

## 2018-06-01 ENCOUNTER — Encounter (HOSPITAL_COMMUNITY): Payer: Self-pay | Admitting: Internal Medicine

## 2018-06-01 ENCOUNTER — Emergency Department (HOSPITAL_COMMUNITY)
Admission: EM | Admit: 2018-06-01 | Discharge: 2018-06-01 | Disposition: A | Discharge status: Home / Self Care | Payer: Medicaid Other | Attending: Emergency Medicine | Admitting: Emergency Medicine

## 2018-06-01 DIAGNOSIS — F840 Autistic disorder: Secondary | ICD-10-CM | POA: Insufficient documentation

## 2018-06-01 DIAGNOSIS — Z79899 Other long term (current) drug therapy: Secondary | ICD-10-CM | POA: Insufficient documentation

## 2018-06-01 DIAGNOSIS — R569 Unspecified convulsions: Secondary | ICD-10-CM | POA: Diagnosis not present

## 2018-06-01 DIAGNOSIS — R479 Unspecified speech disturbances: Secondary | ICD-10-CM | POA: Diagnosis not present

## 2018-06-01 NOTE — ED Provider Notes (Signed)
[facility] EMERGENCY DEPARTMENT Provider Note   CSN: [id number] Arrival date & time: 06/01/18  4098     History   Chief Complaint Chief Complaint  Patient presents with  . Seizures    HPI Jasson Q Joyce is a 17 y.o. male.  Level 5 caveat due to nonverbal autistic patient.   Ayub MARCUS SCHWANDT is a 17 y.o. male, with a history of autism and seizures, presenting to the ED with a reported seizure.  History is provided by the patient's mother at the bedside.  Patient is largely nonverbal. Patient sibling notified the mother that he thought the patient was seizing.  Sibling reports patient was not saying anything and his eyes rolled upward.  Was not able to communicate a duration of the symptoms.  When mother reach the patient less than a minute later, patient was behaving normally. Patient had not received his morning seizure medications prior to the episode, but they were administered prior to ED arrival. Patient usually has tonic-clonic type seizures. He has been having about one seizure a month since June 2019, which was when he had his first seizure. He is under the care of neurologist, Dr. Marjory Lies, whom he last saw Nov 11. His Vimpat was increased from 100 mg twice daily to 200 mg twice daily. Mother denies recent illness, falls/trauma, fever, vomiting, diarrhea, incontinence, recent complaints of any kind, or any other abnormalities.  The history is provided by a parent (Mother).  Seizures   This is a recurrent problem. The current episode started less than 1 hour ago. The problem has been rapidly improving. There was 1 seizure. Duration: Unknown. Characteristics include eye deviation. The episode was witnessed. The seizures did not continue in the ED. The seizure(s) had no focality. There has been no fever. There were no medications administered prior to arrival.       Past Medical History:  Diagnosis Date  . Autism   . Seizure (HCC)   . Sickle cell trait  [facility])     Patient Active Problem List   Diagnosis Date Noted  . Impaired oral gastric feeding tube   . Encounter for imaging study to confirm orogastric (OG) tube placement   . Acute respiratory failure (HCC) 04/27/2018  . Eczema 04/20/2018  . Hypoxia   . Seizure (HCC) 03/10/2018  . History of ETT   . Hb-SS disease without crisis (HCC)   . Hemoptysis 01/14/2018  . Endotracheally intubated   . Seizure-like activity (HCC) 12/21/2017  . Diffuse pulmonary alveolar hemorrhage   . Obesity 08/25/2011  . Autism spectrum disorder 09/17/2007  . SICKLE CELL TRAIT 08/31/2006    History reviewed. No pertinent surgical history.      Home Medications    Prior to Admission medications   Medication Sig Start Date End Date Taking? Authorizing Provider  diphenhydrAMINE HCl, Sleep, (ZZZQUIL PO) Take 30 mLs by mouth at bedtime.    [provider]  lacosamide (VIMPAT) 200 MG TABS tablet Take 1 tablet (200 mg total) by mouth 2 (two) times daily. 05/16/18   Penumalli, Glenford Bayley, MD  levETIRAcetam (KEPPRA) 100 MG/ML solution Take 15 mLs (1,500 mg total) by mouth 2 (two) times daily. 05/14/18   Penumalli, Glenford Bayley, MD    Family History Family History  Problem Relation Age of Onset  . Asthma Mother   . Hypertension Father   . Sickle cell trait Father   . Bipolar disorder Father   . Asthma Sister   . Hypertension Maternal Grandmother   .  Early death Neg Hx     Social History Social History   Tobacco Use  . Smoking status: Never Smoker  . Smokeless tobacco: Never Used  Substance Use Topics  . Alcohol use: Never    Frequency: Never  . Drug use: Never     Allergies   Patient has no known allergies.   Review of Systems Review of Systems  Unable to perform ROS: Patient nonverbal  Neurological: Positive for seizures.     Physical Exam Updated Vital Signs BP (!) 150/98   Pulse (!) 134   Temp 98.5 F (36.9 C) (Oral)   Resp 19   SpO2 100%   Physical Exam    Constitutional: He appears well-developed and well-nourished. No distress.  HENT:  Head: Normocephalic and atraumatic.  Mouth/Throat: Oropharynx is clear and moist.  Eyes: Pupils are equal, round, and reactive to light. Conjunctivae and EOM are normal.  Neck: Neck supple.  Cardiovascular: Regular rhythm, normal heart sounds and intact distal pulses. Tachycardia present.  Patient's mother states she thinks his heart rate is elevated because he is nervous from this situation and the people in the room.  Pulmonary/Chest: Effort normal and breath sounds normal. No respiratory distress.  No increased work of breathing.  Abdominal: Soft. There is no tenderness. There is no guarding.  Musculoskeletal: He exhibits no edema.  Lymphadenopathy:    He has no cervical adenopathy.  Neurological: He is alert.  Patient is spontaneously alert and will follow simple commands, consistent with his baseline. Motor function intact in each extremity.  No facial droop noted.  Skin: Skin is warm and dry. He is not diaphoretic.  Psychiatric: He has a normal mood and affect. His behavior is normal.  Nursing note and vitals reviewed.    ED Treatments / Results  Labs (all labs ordered are listed, but only abnormal results are displayed) Labs Reviewed - No data to display  EKG EKG Interpretation  Date/Time:  Friday June 01 2018 07:33:32 EST Ventricular Rate:  135 PR Interval:    QRS Duration: 94 QT Interval:  302 QTC Calculation: 453 R Axis:   -13 Text Interpretation:  Sinus tachycardia LVH by voltage No significant change since last tracing Confirmed by Rochele RaringWard, Kristen [id number]) on 06/01/2018 7:44:24 AM   Radiology No results found.  Procedures Procedures (including critical care time)  Medications Ordered in ED Medications - No data to display   Initial Impression / Assessment and Plan / ED Course  I have reviewed the triage vital signs and the nursing notes.  Pertinent labs & imaging  results that were available during my care of the patient were reviewed by me and considered in my medical decision making (see chart for details).  Clinical Course as of Jun 01 848  Fri Jun 01, 2018  [id number] Chart review reveals patient is frequently tachycardic, even at time of discharge from the hospital.  Pulse Rate(!): 118 [SJ]    Clinical Course User Index [SJ] Anselm PancoastJoy, Okechukwu Regnier C, PA-C    Patient presents with mother reporting concern for possible seizure.  He was at baseline upon EMS arrival and has been at baseline during ED observation.  Patient was reevaluated multiple times, lung sounds and breathing status were also assessed multiple times with no adverse findings.  Patient is nontoxic appearing, afebrile, not tachypneic, not hypotensive, maintains excellent SPO2 on room air, and is in no apparent distress.  Patient mother states in order to obtain an IV, blood work, or even CBG, patient would  need to be sedated and/or restrained.  She would like to avoid this, if possible.  Shared decision-making was used and mother acknowledges the risks of not establishing IV access, checking labs, or checking CBG. Patient's tachycardia was noted and discussed with the patient's mother.  She states is typical for his heart rate to be elevated when he is outside the home because it makes him nervous.  Heart rate is observed to drop to around 106-110 when no one but the patient's mother is in the room.  Mother states she will orally hydrate him when he gets home.  Patient to follow-up with his neurologist.  Return precautions discussed.  Patient's mother voices understanding of these instructions, accepts the plan, and is comfortable with discharge.  Findings and plan of care discussed with Margarita Grizzle, MD. Dr. Rosalia Hammers personally evaluated and examined this patient.  Vitals:   06/01/18 0815 06/01/18 0830 06/01/18 0839 06/01/18 0842  BP: (!) 144/98 (!) 145/99 (!) 140/92 135/87  Pulse: (!) 116 (!) 122  (!) 116    Resp: 19 (!) 22  (!) 22  Temp:      TempSrc:      SpO2: 99% 100% 100% 100%     Final Clinical Impressions(s) / ED Diagnoses   Final diagnoses:  Seizure-like activity [facility])    ED Discharge Orders    None       Concepcion Living 06/01/18 0849    Margarita Grizzle, MD 06/02/18 [phone]

## 2018-06-01 NOTE — ED Triage Notes (Signed)
Pt here via [facility] after seizure this morning. Per EMS/mother, patient normally has grand mal seizures. Today's seizure was absent in nature. Hx autism and seizure disorder. Mother gave patient 1500mg  keppra PO prior to EMS arrival. Family reports patient is at baseline.

## 2018-06-01 NOTE — ED Notes (Signed)
ED Provider at bedside. 

## 2018-06-01 NOTE — ED Notes (Signed)
Patient verbalizes understanding of discharge instructions. Opportunity for questioning and answers were provided. Armband removed by staff, pt discharged from ED.  

## 2018-06-01 NOTE — Discharge Instructions (Signed)
Please follow-up with the neurologist as soon as possible on this matter.  Return to the ED at anytime for any further concerns.

## 2018-06-01 NOTE — ED Notes (Signed)
Seizure pads applied to stretcher

## 2018-06-04 ENCOUNTER — Telehealth: Payer: Self-pay | Admitting: Diagnostic Neuroimaging

## 2018-06-04 NOTE — Telephone Encounter (Signed)
Called mother, on DPR who stated his brother was with him on Friday when he had the seizure. The brother stated his eyes rolled into the back of his head, and he made gurgling sounds, and rolled onto his left side. When parents got to room he was coming out of it. The brother stated there was no shaking during the seizure.  Mother stated they always find him on his left side after a seizure.  He was taken to ED, VS stable, no labs drawn. She denied any recent illness or that he missed any doses of lacosamide or Keppra and doses prescribed were verified with her as doses he is tkaing. This RN advised will discuss with Dr Marjory LiesPenumalli and let her know his recommendations. She verbalized understanding, appreciation.

## 2018-06-04 NOTE — Telephone Encounter (Signed)
Patients mom called and stated that the patient had a seizure Friday. They ED advised her to call Dr. Marjory LiesPenumalli and make him aware. Please call and advise.

## 2018-06-05 DIAGNOSIS — Z0271 Encounter for disability determination: Secondary | ICD-10-CM

## 2018-06-06 ENCOUNTER — Ambulatory Visit: Payer: Medicaid Other

## 2018-06-06 MED ORDER — CARBAMAZEPINE ER 200 MG PO CP12: 200.0000 mg | ORAL_CAPSULE | Freq: Two times a day (BID) | ORAL | 12 refills | Status: DC

## 2018-06-06 NOTE — Telephone Encounter (Signed)
Start carbamazepine ER 200mg  twice a day.   Meds ordered this encounter  Medications  . carbamazepine (CARBATROL) 200 MG 12 hr capsule    Sig: Take 1 capsule (200 mg total) by mouth 2 (two) times daily.    Dispense:  60 capsule    Refill:  12    Suanne MarkerVIKRAM R. Ileen Kahre, MD 06/06/2018, 4:17 PM Certified in Neurology, Neurophysiology and Neuroimaging  [facility] [address], [address] [place], [place] [address] [phone]

## 2018-06-06 NOTE — Telephone Encounter (Signed)
LVM for mother advising her of new Rx Dr Marjory LiesPenumalli sent in to pharmacy. Advised she call for any problems, questions after starting new medication. Left office number.

## 2018-06-06 NOTE — Addendum Note (Signed)
Addended by: Joycelyn SchmidPENUMALLI, Ramal Eckhardt R on: 06/06/2018 04:17 PM   Modules accepted: Orders

## 2018-07-24 ENCOUNTER — Encounter (HOSPITAL_COMMUNITY): Payer: Self-pay

## 2018-07-24 ENCOUNTER — Emergency Department (HOSPITAL_COMMUNITY)
Admission: EM | Admit: 2018-07-24 | Discharge: 2018-07-24 | Disposition: A | Discharge status: Home / Self Care | Payer: Medicaid Other | Attending: Emergency Medicine | Admitting: Emergency Medicine

## 2018-07-24 ENCOUNTER — Other Ambulatory Visit: Payer: Self-pay

## 2018-07-24 DIAGNOSIS — Z79899 Other long term (current) drug therapy: Secondary | ICD-10-CM | POA: Insufficient documentation

## 2018-07-24 DIAGNOSIS — F840 Autistic disorder: Secondary | ICD-10-CM | POA: Diagnosis not present

## 2018-07-24 DIAGNOSIS — R569 Unspecified convulsions: Secondary | ICD-10-CM | POA: Insufficient documentation

## 2018-07-24 DIAGNOSIS — D573 Sickle-cell trait: Secondary | ICD-10-CM | POA: Diagnosis not present

## 2018-07-24 MED ORDER — LEVETIRACETAM 100 MG/ML PO SOLN
1000.0000 mg | Freq: Once | ORAL | Status: AC
  Administered 2018-07-24: 1000 mg via ORAL
  Filled 2018-07-24: qty 10

## 2018-07-24 NOTE — ED Provider Notes (Signed)
[facility]-EMERGENCY DEPT Provider Note   CSN: [id number] Arrival date & time: 07/24/18  2005     History   Chief Complaint Chief Complaint  Patient presents with  . Seizures    HPI Billy Ayala is a 18 y.o. male.  Patient is a 18 year old male with past medical history of autism, seizure disorder.  He is brought by mom for evaluation of seizure activity.  She states this afternoon he began to have rhythmic shaking of his head and neck that lasted for approximately 1 minute.  According to the mother, he was minimally responsive during this episode.  He then seemed confused afterward.  Mom denies any recent illnesses such as fever, cough, congestion, or diarrhea.  He has been taking his medications as prescribed.  These medications include Vimpat, Keppra, and and somewhat recently added Carbamazepine.  He is now back to his baseline according to the mother at bedside.  The history is provided by the patient.  Seizures  Seizure activity on arrival: no   Initial focality:  None Postictal symptoms: confusion   Return to baseline: yes   Severity:  Moderate Duration:  1 minute Timing:  Once   Past Medical History:  Diagnosis Date  . Autism   . Seizure (HCC)   . Sickle cell trait [facility])     Patient Active Problem List   Diagnosis Date Noted  . Impaired oral gastric feeding tube   . Encounter for imaging study to confirm orogastric (OG) tube placement   . Acute respiratory failure (HCC) 04/27/2018  . Eczema 04/20/2018  . Hypoxia   . Seizure (HCC) 03/10/2018  . History of ETT   . Hb-SS disease without crisis (HCC)   . Hemoptysis 01/14/2018  . Endotracheally intubated   . Seizure-like activity (HCC) 12/21/2017  . Diffuse pulmonary alveolar hemorrhage   . Obesity 08/25/2011  . Autism spectrum disorder 09/17/2007  . SICKLE CELL TRAIT 08/31/2006    History reviewed. No pertinent surgical history.      Home Medications    Prior to Admission  medications   Medication Sig Start Date End Date Taking? Authorizing Provider  carbamazepine (CARBATROL) 200 MG 12 hr capsule Take 1 capsule (200 mg total) by mouth 2 (two) times daily. 06/06/18   Penumalli, Glenford Bayley, MD  diphenhydrAMINE HCl, Sleep, (ZZZQUIL PO) Take 30 mLs by mouth at bedtime.    [provider]  lacosamide (VIMPAT) 200 MG TABS tablet Take 1 tablet (200 mg total) by mouth 2 (two) times daily. 05/16/18   Penumalli, Glenford Bayley, MD  levETIRAcetam (KEPPRA) 100 MG/ML solution Take 15 mLs (1,500 mg total) by mouth 2 (two) times daily. 05/14/18   Penumalli, Glenford Bayley, MD    Family History Family History  Problem Relation Age of Onset  . Asthma Mother   . Hypertension Father   . Sickle cell trait Father   . Bipolar disorder Father   . Asthma Sister   . Hypertension Maternal Grandmother   . Early death Neg Hx     Social History Social History   Tobacco Use  . Smoking status: Never Smoker  . Smokeless tobacco: Never Used  Substance Use Topics  . Alcohol use: Never    Frequency: Never  . Drug use: Never     Allergies   Patient has no known allergies.   Review of Systems Review of Systems  Neurological: Positive for seizures.  All other systems reviewed and are negative.    Physical Exam Updated  Vital Signs BP (!) 144/97   Pulse 100   Temp 99 F (37.2 C)   Resp (!) 22   Ht 6\' 2"  (1.88 m)   Wt 103.9 kg   SpO2 99%   BMI 29.40 kg/m   Physical Exam Vitals signs and nursing note reviewed.  Constitutional:      General: He is not in acute distress.    Appearance: He is well-developed. He is not diaphoretic.  HENT:     Head: Normocephalic and atraumatic.  Eyes:     Extraocular Movements: Extraocular movements intact.     Pupils: Pupils are equal, round, and reactive to light.  Neck:     Musculoskeletal: Normal range of motion and neck supple.  Cardiovascular:     Rate and Rhythm: Normal rate and regular rhythm.     Heart sounds: No murmur. No  friction rub.  Pulmonary:     Effort: Pulmonary effort is normal. No respiratory distress.     Breath sounds: Normal breath sounds. No wheezing or rales.  Abdominal:     General: Bowel sounds are normal. There is no distension.     Palpations: Abdomen is soft.     Tenderness: There is no abdominal tenderness.  Musculoskeletal: Normal range of motion.  Skin:    General: Skin is warm and dry.  Neurological:     General: No focal deficit present.     Mental Status: He is alert and oriented to person, place, and time.     Coordination: Coordination normal.     Comments: Moves all 4 extremities and coordination is normal.  Neurologic exam is somewhat difficult secondary to patient's baseline level of functioning.      ED Treatments / Results  Labs (all labs ordered are listed, but only abnormal results are displayed) Labs Reviewed - No data to display  EKG None  Radiology No results found.  Procedures Procedures (including critical care time)  Medications Ordered in ED Medications - No data to display   Initial Impression / Assessment and Plan / ED Course  I have reviewed the triage vital signs and the nursing notes.  Pertinent labs & imaging results that were available during my care of the patient were reviewed by me and considered in my medical decision making (see chart for details).  Patient with history of autism and seizure disorder presenting after a 1 minute seizure that occurred at home.  Patient was brought here and appears clinically well.  According to the patient's mother he becomes extremely combative when he has blood drawn and has required multiple staff members to restrain him in the past.  As the patient had a very brief seizure and is now back to baseline, I feel as though attempting to draw blood would be more traumatic than helpful.  The mother is in agreement with this assessment.  I have discussed the case with Dr. Laurence Slate from neurology.  The patient  will be given additional dose of Keppra here in the ER and will follow up with his neurologist tomorrow.  Final Clinical Impressions(s) / ED Diagnoses   Final diagnoses:  None    ED Discharge Orders    None       Geoffery Lyons, MD 07/24/18 2315

## 2018-07-24 NOTE — Discharge Instructions (Addendum)
Continue medications as previously prescribed.  Follow-up with your neurologist tomorrow to discuss your medications.  Return to the emergency department if symptoms worsen or change.

## 2018-07-24 NOTE — ED Triage Notes (Signed)
Per EMS, patient coming from home with complaints of a seizure. Patient has a history of seizure disorders and is compliant with medications.Patient currently takes Tegretol, Keppra, and Vampat. Last seizure was in Nov 2019 and this seizure was similar to last one. Seizure was localized to the right side and lasted approximately 1 minute and patient was post ictal for approximately 1.5 minutes.      Patient is at baseline currently. Patient is on the autism spectrum and has limited communication at baseline.

## 2018-07-24 NOTE — ED Notes (Signed)
Bed: WA06 Expected date:  Expected time:  Means of arrival:  Comments: 18 yr old seizure

## 2018-07-25 ENCOUNTER — Telehealth: Payer: Self-pay | Admitting: Diagnostic Neuroimaging

## 2018-07-25 NOTE — Telephone Encounter (Signed)
Pt mother(on DPR-Smith,[name])called to inform pt had a seizure on yesterday, was taken to ED, the physcian advised mother to call office of Dr Marjory Lies to see what he thinks about increasing the carbamazepine (CARBATROL) 200 MG 12 hr capsule.  Mother is asking for a call

## 2018-07-25 NOTE — Telephone Encounter (Signed)
Spoke to mother of pt.  Seizure yesterday lasted about one minute in pts room at home.  See ED note.  No additional meds given.  ? Increasing carbamazepine (now 200mg  po bid).  Trigger?  No seizure in December per mom.  Last sz noted 06/01/2018.  Taking vimpat 200mg  po bid, and keppra solution 1500mg  po bid.   Please advise.

## 2018-07-26 MED ORDER — CARBAMAZEPINE ER 200 MG PO CP12: 400.0000 mg | ORAL_CAPSULE | Freq: Two times a day (BID) | ORAL | 12 refills | Status: DC

## 2018-07-26 NOTE — Addendum Note (Signed)
Addended by: Joycelyn Schmid R on: 07/26/2018 04:46 PM   Modules accepted: Orders

## 2018-07-26 NOTE — Telephone Encounter (Signed)
Spoke to Ms. Billy Ayala, mother of pt and relayed that Dr. Marjory Lies will increase the carbamazepine  To 400mg  po BID. Prescription sent into pharmacy.  Mother verbalized understanding of instructions.  Will call back as needed.

## 2018-07-26 NOTE — Telephone Encounter (Signed)
Increase CBZ to 400mg  twice a day.   Meds ordered this encounter  Medications  . carbamazepine (CARBATROL) 200 MG 12 hr capsule    Sig: Take 2 capsules (400 mg total) by mouth 2 (two) times daily.    Dispense:  120 capsule    Refill:  12    Suanne Marker, MD 07/26/2018, 4:46 PM Certified in Neurology, Neurophysiology and Neuroimaging  [facility] [address], [address] [place], [place] [address] [phone]

## 2018-08-22 DIAGNOSIS — Z0289 Encounter for other administrative examinations: Secondary | ICD-10-CM

## 2018-09-03 ENCOUNTER — Ambulatory Visit (INDEPENDENT_AMBULATORY_CARE_PROVIDER_SITE_OTHER): Payer: Medicaid Other | Admitting: Diagnostic Neuroimaging

## 2018-09-03 ENCOUNTER — Encounter: Payer: Self-pay | Admitting: Diagnostic Neuroimaging

## 2018-09-03 VITALS — BP 152/95 | HR 92 | Ht 74.0 in | Wt 248.0 lb

## 2018-09-03 DIAGNOSIS — G40909 Epilepsy, unspecified, not intractable, without status epilepticus: Secondary | ICD-10-CM | POA: Diagnosis not present

## 2018-09-03 DIAGNOSIS — F840 Autistic disorder: Secondary | ICD-10-CM

## 2018-09-03 MED ORDER — CARBAMAZEPINE ER 200 MG PO CP12: 400.0000 mg | ORAL_CAPSULE | Freq: Two times a day (BID) | ORAL | 12 refills | Status: DC

## 2018-09-03 MED ORDER — LACOSAMIDE 200 MG PO TABS: 200.0000 mg | ORAL_TABLET | Freq: Two times a day (BID) | ORAL | 5 refills | Status: DC

## 2018-09-03 MED ORDER — LEVETIRACETAM 100 MG/ML PO SOLN: 1500.0000 mg | Freq: Two times a day (BID) | ORAL | 12 refills | Status: DC

## 2018-09-03 NOTE — Progress Notes (Signed)
[facility]  PATIENT: Billy Ayala DOB: [birth year]  REFERRING CLINICIAN: A Riccio, DO HISTORY FROM: mother, chart review, patient REASON FOR VISIT: follow up   HISTORICAL  CHIEF COMPLAINT:  Chief Complaint  Patient presents with  . Seizures    rm 7, Mom- Tequila, "seizure on 07/24/18 x 10 seconds"   . Follow-up    HISTORY OF PRESENT ILLNESS:   UPDATE (09/03/18, VRP): Since last visit, had mild sz (10-15 seconds) on 07/24/18. Went to ER and was stable. Since then, now on higher CBZ dosing and doing well. No more events. Intermittent mild stuttering. No other side effects.   UPDATE (05/14/18, VRP): Since last visit, had another seizure and admission in oct 2019. Had aspiration, resp failure, hemoptysis, and ICU support. Now on vimpat + levetiracetam. Some decr appetite and energy. No more seizures.    UPDATE (03/19/18, VRP): Since last visit, had 2 more seizures. LEV has been increased. Last sz no 03/11/18. No alleviating or aggravating factors. Tolerating medication.   PRIOR HPI (02/27/18): 18 year old male here for evaluation of seizures.  History of autism.  June 2019 patient was at home, collapsed and had generalized convulsions.  Patient was taken to the emergency room and then began to cough and vomit blood.  Patient was also diagnosed with diffuse alveolar hemorrhage.  He was started on antiseizure medication.  No specific etiology for hemoptysis was found.  July 2019 patient returned to the hospital for another seizure.  Again patient began to have pulmonary edema and hemoptysis.  Patient was immediately started on steroids and antiseizure medication dose was increased.  Since that time patient has been doing well.  No further seizures or hemoptysis.  He has been following up with PCP and pulmonary clinic.  He has additional pulmonary testing scheduled for October 2019.  No prior history of seizures.  There is family history of seizure in patient's father  and paternal aunt.  No side effects of antiseizure medication at this time.   REVIEW OF SYSTEMS: Full 14 system review of systems performed and negative with exception of: speech diff.    ALLERGIES: No Known Allergies  HOME MEDICATIONS: Outpatient Medications Prior to Visit  Medication Sig Dispense Refill  . carbamazepine (CARBATROL) 200 MG 12 hr capsule Take 2 capsules (400 mg total) by mouth 2 (two) times daily. 120 capsule 12  . diphenhydrAMINE HCl, Sleep, (ZZZQUIL PO) Take 30 mLs by mouth at bedtime.    Marland Kitchen lacosamide (VIMPAT) 200 MG TABS tablet Take 1 tablet (200 mg total) by mouth 2 (two) times daily. 60 tablet 5  . levETIRAcetam (KEPPRA) 100 MG/ML solution Take 15 mLs (1,500 mg total) by mouth 2 (two) times daily. 473 mL 12   No facility-administered medications prior to visit.     PAST MEDICAL HISTORY: Past Medical History:  Diagnosis Date  . Autism   . Seizure (HCC)    sz 07/24/2018  . Sickle cell trait (HCC)     PAST SURGICAL HISTORY: No past surgical history on file.  FAMILY HISTORY: Family History  Problem Relation Age of Onset  . Asthma Mother   . Hypertension Father   . Sickle cell trait Father   . Bipolar disorder Father   . Asthma Sister   . Hypertension Maternal Grandmother   . Early death Neg Hx     SOCIAL HISTORY: Social History   Socioeconomic History  . Marital status: Single    Spouse name: Not on file  . Number of children:  Not on file  . Years of education: Not on file  . Highest education level: Not on file  Occupational History  . Not on file  Social Needs  . Financial resource strain: Not on file  . Food insecurity:    Worry: Not on file    Inability: Not on file  . Transportation needs:    Medical: Not on file    Non-medical: Not on file  Tobacco Use  . Smoking status: Never Smoker  . Smokeless tobacco: Never Used  Substance and Sexual Activity  . Alcohol use: Never    Frequency: Never  . Drug use: Never  . Sexual  activity: Not on file  Lifestyle  . Physical activity:    Days per week: Not on file    Minutes per session: Not on file  . Stress: Not on file  Relationships  . Social connections:    Talks on phone: Not on file    Gets together: Not on file    Attends religious service: Not on file    Active member of club or organization: Not on file    Attends meetings of clubs or organizations: Not on file    Relationship status: Not on file  . Intimate partner violence:    Fear of current or ex partner: Not on file    Emotionally abused: Not on file    Physically abused: Not on file    Forced sexual activity: Not on file  Other Topics Concern  . Not on file  Social History Narrative   Lives with mom, three half brothers and boyfriend      Goes to [employer] full school days (6th grader)   Right handed    No caffeine      PHYSICAL EXAM  GENERAL EXAM/CONSTITUTIONAL: Vitals:  Vitals:   09/03/18 1611  BP: (!) 152/95  Pulse: 92  Weight: 248 lb (112.5 kg)  Height: 6\' 2"  (1.88 m)   Body mass index is 31.84 kg/m. Wt Readings from Last 3 Encounters:  09/03/18 248 lb (112.5 kg) (>99 %, Z= 2.38)*  07/24/18 229 lb (103.9 kg) (98 %, Z= 2.07)*  05/14/18 229 lb 9.6 oz (104.1 kg) (98 %, Z= 2.09)*   * Growth percentiles are based on CDC (Boys, 2-20 Years) data.    Patient is in no distress; well developed, nourished and groomed; neck is supple  CARDIOVASCULAR:  Examination of carotid arteries is normal; no carotid bruits  Regular rate and rhythm, no murmurs  Examination of peripheral vascular system by observation and palpation is normal  EYES:  Ophthalmoscopic exam of optic discs and posterior segments is normal; no papilledema or hemorrhages No exam data present  MUSCULOSKELETAL:  Gait, strength, tone, movements noted in Neurologic exam below  NEUROLOGIC: MENTAL STATUS:  No flowsheet data found.  awake, alert, oriented to person  [facility] memory   normal attention and  concentration  DECR FLUENCY; FOLLOW SIMPLE COMMANDS  fund of knowledge appropriate  CRANIAL NERVE:   2nd - no papilledema on fundoscopic exam  2nd, 3rd, 4th, 6th - pupils equal and reactive to light, visual fields full to confrontation, extraocular muscles intact, no nystagmus  5th - facial sensation symmetric  7th - facial strength symmetric  8th - hearing intact  9th - palate elevates symmetrically, uvula midline  11th - shoulder shrug symmetric  12th - tongue protrusion midline  MOTOR:   normal bulk and tone, full strength in the BUE, BLE  SENSORY:   normal and  symmetric to light touch  COORDINATION:   finger-nose-finger, fine finger movements normal  REFLEXES:   deep tendon reflexes present and symmetric  GAIT/STATION:   narrow based gait     DIAGNOSTIC DATA (LABS, IMAGING, TESTING) - I reviewed patient records, labs, notes, testing and imaging myself where available.  Lab Results  Component Value Date   WBC 9.2 05/03/2018   HGB 14.9 05/03/2018   HCT 48.2 05/03/2018   MCV 75.2 (L) 05/03/2018   PLT 416 (H) 05/03/2018      Component Value Date/Time   NA 141 05/03/2018 0955   K 4.3 05/03/2018 0955   CL 101 05/03/2018 0955   CO2 30 05/03/2018 0955   GLUCOSE 125 (H) 05/03/2018 0955   BUN 15 05/03/2018 0955   CREATININE 0.86 05/03/2018 0955   CALCIUM 9.7 05/03/2018 0955   PROT 7.3 04/27/2018 2058   ALBUMIN 3.9 04/27/2018 2058   AST 32 04/27/2018 2058   ALT 25 04/27/2018 2058   ALKPHOS 103 04/27/2018 2058   BILITOT 0.5 04/27/2018 2058   GFRNONAA >60 05/03/2018 0955   GFRAA >60 05/03/2018 0955   Lab Results  Component Value Date   TRIG 142 04/30/2018   No results found for: HGBA1C Lab Results  Component Value Date   VITAMINB12 248 01/15/2018   Lab Results  Component Value Date   TSH 0.422 12/14/2017    12/13/17 EEG - This is a normal sleep/sedated EEG for the patients stated age.  There were no focal, hemispheric or lateralizing  features.  No epileptiform activity was recorded.  Comment cannot be made on waking rhythm as the patient was sedated with Versed and Precedex.  A normal EEG does not exclude the diagnosis of a seizure disorder and if seizure remains high on the list of differential diagnosis, a repeat EEG when off of sedation may be of value.  Correlate clinically.  12/13/17 MRI brain [I reviewed images myself and agree with interpretation. -VRP]  - Unremarkable appearance of the brain.    ASSESSMENT AND PLAN  18 y.o. year old male here with autism and seizure disorder.    Initial 2 seizures may have triggered pulmonary edema and hemoptysis. Other possibility is underlying pulmonary disease (autoimmune) leading to hypoxia and seizure secondarily.  I would favor that patient had seizure as the initial event for both admissions; this is based on history per family.   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 Seizure disorder (HCC)   2. Autism      PLAN:  - continue levetiracetam 1500mg  twice a day + vimpat 100mg  twice a day + carbamazepine 400mg  twice a day   - in future may consider adding lamotrigine, topiramate, depakote, onfi or phenobarbital  - Please maintain precautions. Do not participate in activities where a loss of awareness could harm you or someone else. No swimming alone, no tub bathing, no hot tubs, no driving, no operating motorized vehicles (cars, ATVs, motocycles, etc), lawnmowers, power tools or firearms. No standing at heights, such as rooftops, ladders or stairs. Avoid hot objects such as stoves, heaters, open fires. Wear a helmet when riding a bicycle, scooter, skateboard, etc. and avoid areas of traffic. Set your water heater to 120 degrees or less.  Meds ordered this encounter  Medications  . levETIRAcetam (KEPPRA) 100 MG/ML solution    Sig: Take 15 mLs (1,500 mg total) by mouth 2 (two) times daily.    Dispense:  900 mL    Refill:  12  . lacosamide (VIMPAT)  200 MG TABS  tablet    Sig: Take 1 tablet (200 mg total) by mouth 2 (two) times daily.    Dispense:  60 tablet    Refill:  5  . carbamazepine (CARBATROL) 200 MG 12 hr capsule    Sig: Take 2 capsules (400 mg total) by mouth 2 (two) times daily.    Dispense:  120 capsule    Refill:  12   Return in about 6 months (around 03/06/2019).    Suanne Marker, MD 09/03/2018, 4:29 PM Certified in Neurology, Neurophysiology and Neuroimaging  [facility] [address] [place], [place] [address] [phone]

## 2019-03-06 ENCOUNTER — Ambulatory Visit: Payer: Medicaid Other | Admitting: Diagnostic Neuroimaging

## 2019-03-11 IMAGING — DX DG CHEST 2V
2 series · 2 of 2 positions shown · non-contrast
Comparison: 01/17/2018

CLINICAL DATA: Followup hemoptysis

EXAM:
CHEST - 2 VIEW

[chest pa]
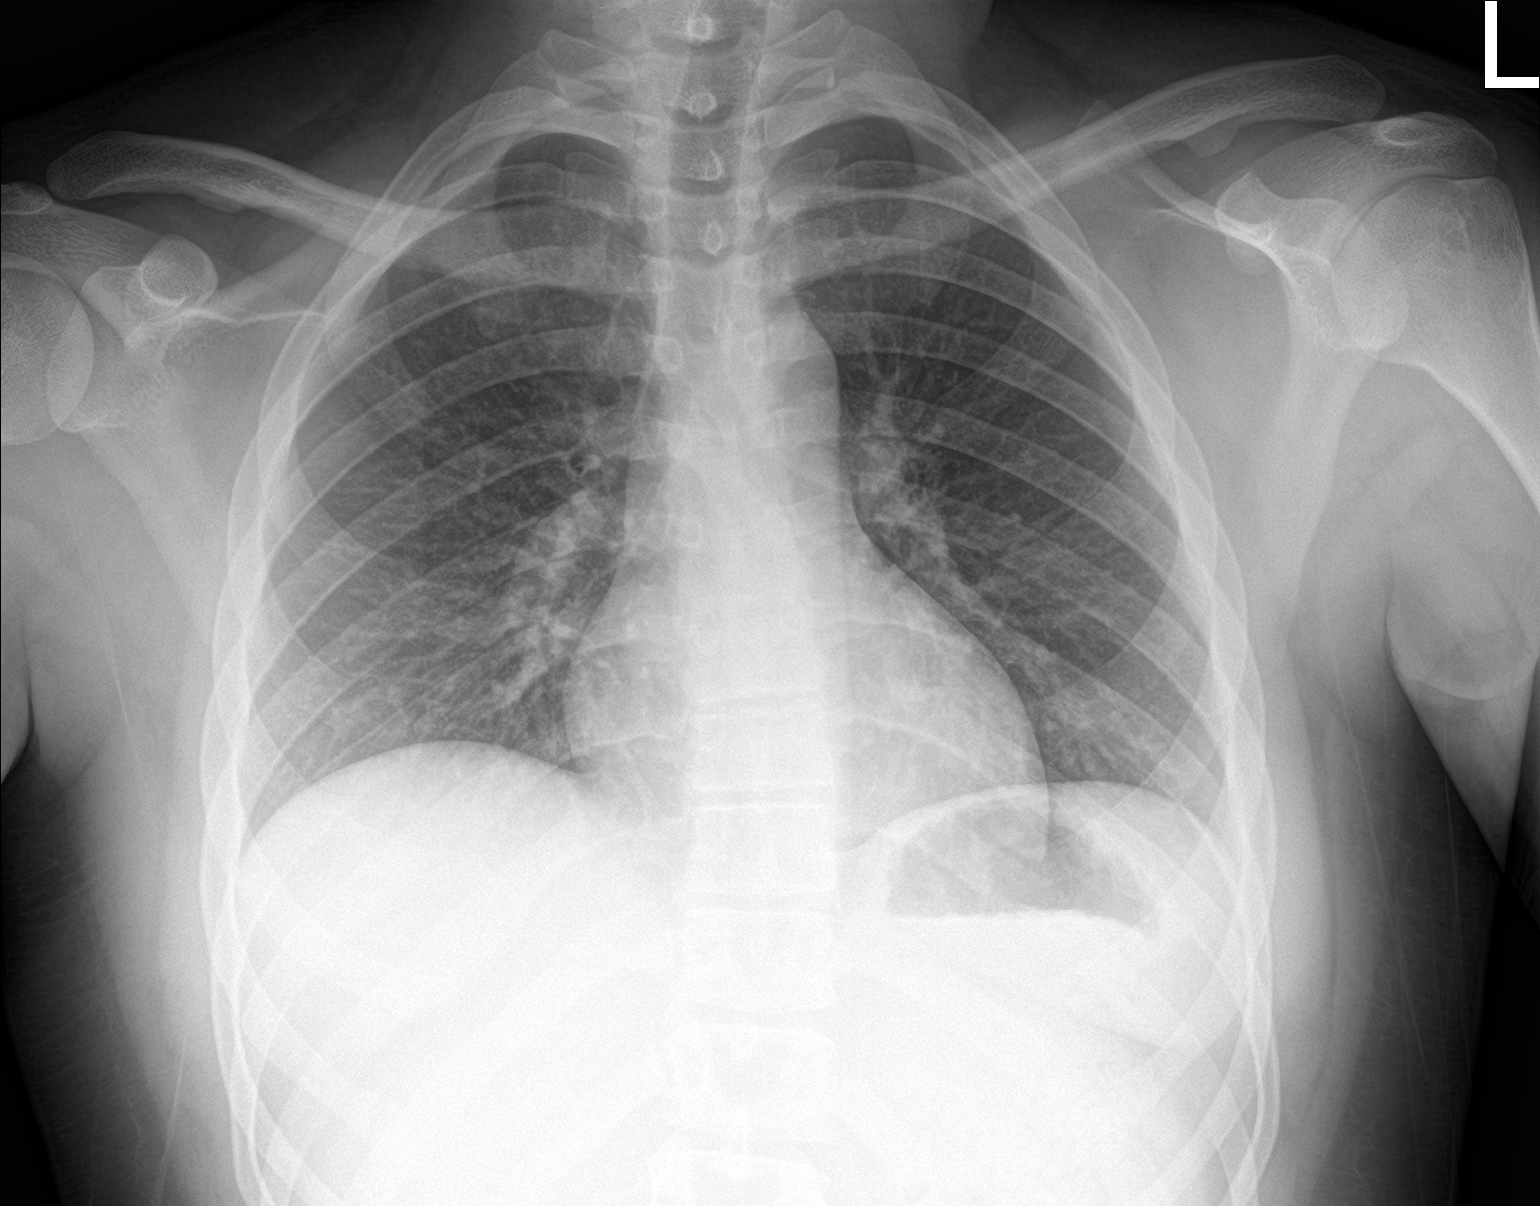

[chest lat]
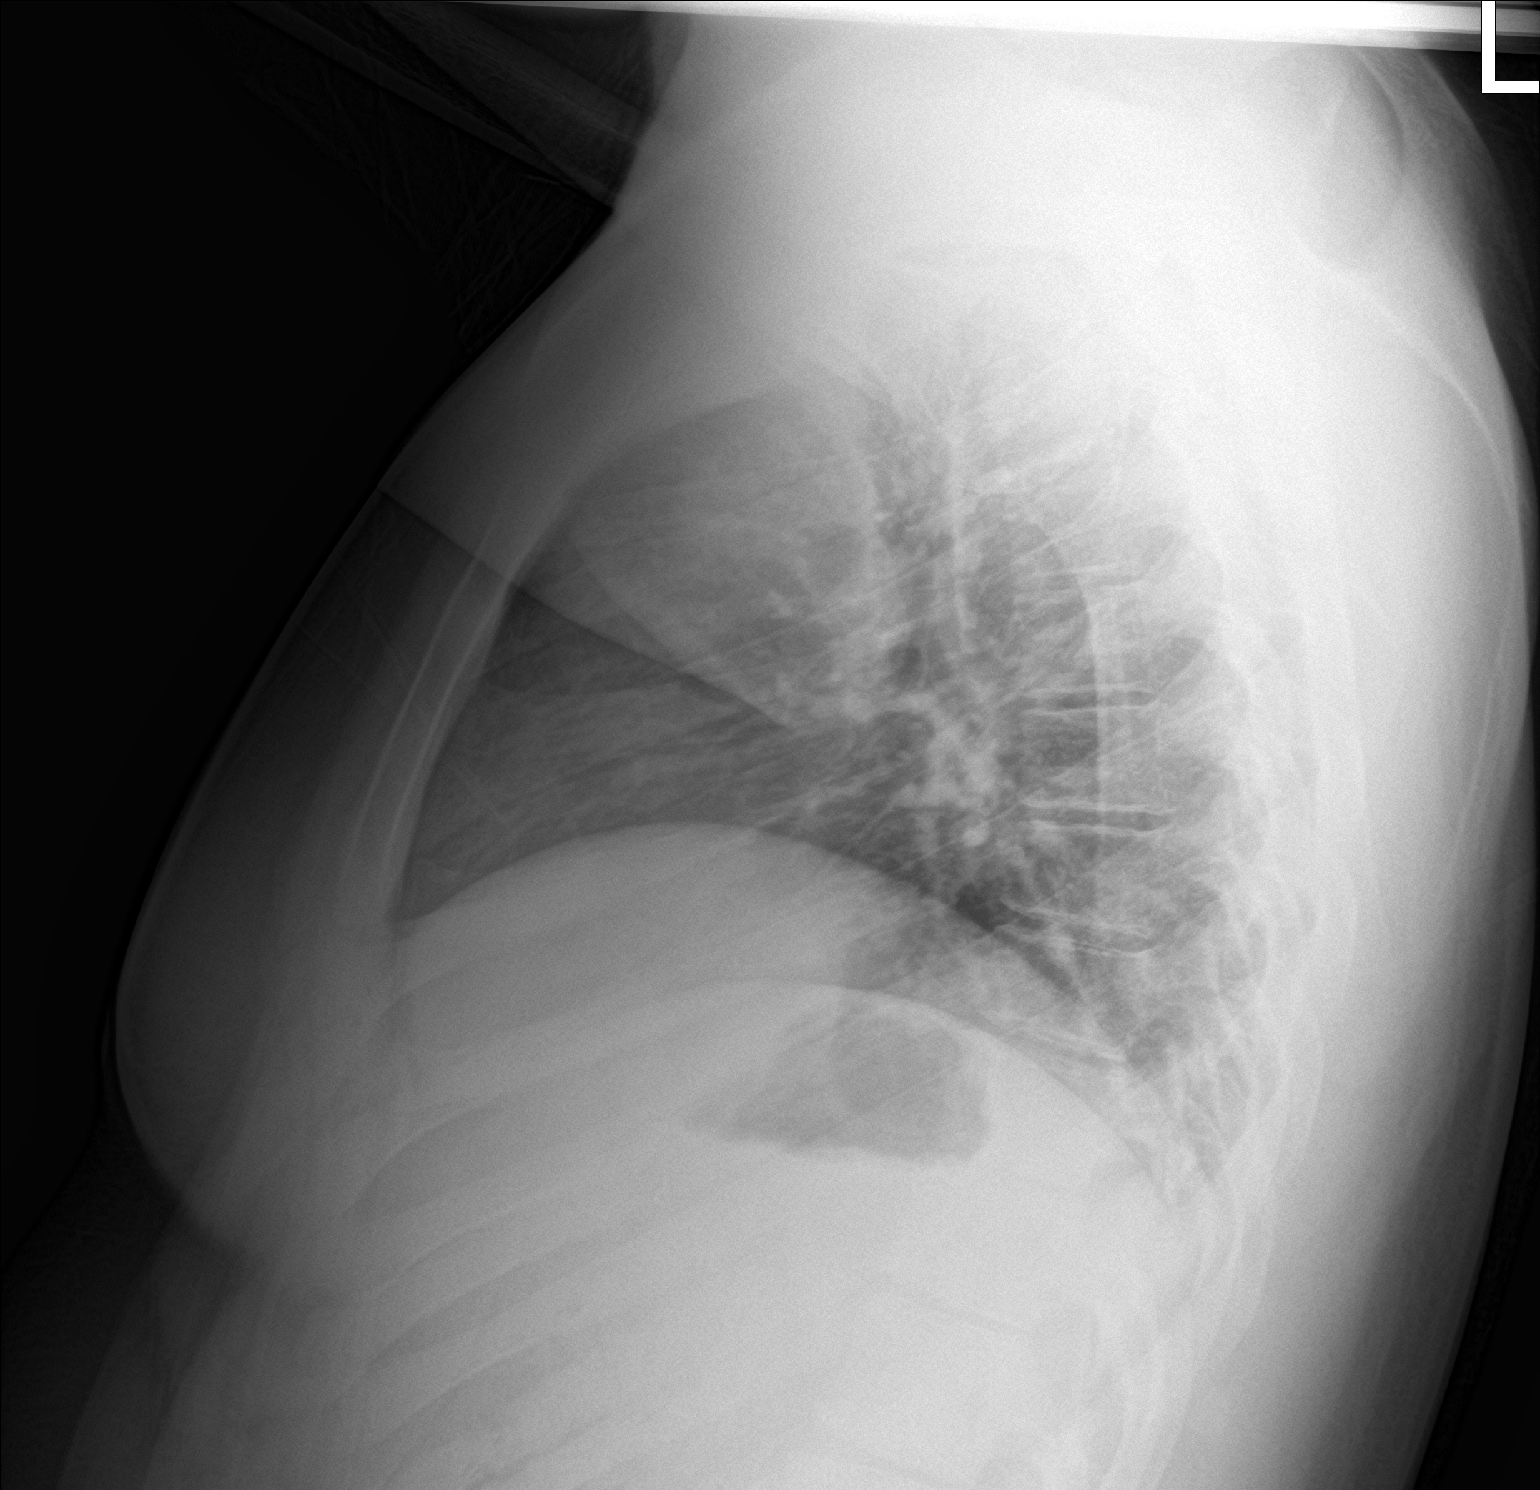

[2 of 2 positions shown; findings below may reference images not displayed]

FINDINGS: Endotracheal tube and nasogastric catheter have been removed in the
interval. Cardiac shadow is stable. The lungs are clear. No bony
abnormality is noted.
IMPRESSION: No acute abnormality seen.

## 2019-03-12 ENCOUNTER — Ambulatory Visit: Payer: Medicaid Other

## 2019-03-20 ENCOUNTER — Other Ambulatory Visit: Payer: Self-pay | Admitting: Diagnostic Neuroimaging

## 2019-03-20 ENCOUNTER — Other Ambulatory Visit: Payer: Self-pay

## 2019-03-20 ENCOUNTER — Telehealth: Payer: Self-pay | Admitting: Neurology

## 2019-03-20 ENCOUNTER — Ambulatory Visit (INDEPENDENT_AMBULATORY_CARE_PROVIDER_SITE_OTHER): Payer: Medicaid Other | Admitting: *Deleted

## 2019-03-20 DIAGNOSIS — Z23 Encounter for immunization: Secondary | ICD-10-CM

## 2019-03-20 NOTE — Telephone Encounter (Signed)
Need Rx for vimpat, already sent in by Dr. Rexene Alberts

## 2019-04-22 ENCOUNTER — Other Ambulatory Visit: Payer: Self-pay

## 2019-04-22 ENCOUNTER — Ambulatory Visit (INDEPENDENT_AMBULATORY_CARE_PROVIDER_SITE_OTHER): Payer: Medicaid Other | Admitting: Diagnostic Neuroimaging

## 2019-04-22 ENCOUNTER — Encounter: Payer: Self-pay | Admitting: Diagnostic Neuroimaging

## 2019-04-22 VITALS — BP 137/93 | HR 93 | Temp 98.3°F | Ht 74.0 in | Wt 254.6 lb

## 2019-04-22 DIAGNOSIS — G40909 Epilepsy, unspecified, not intractable, without status epilepticus: Secondary | ICD-10-CM | POA: Diagnosis not present

## 2019-04-22 DIAGNOSIS — F840 Autistic disorder: Secondary | ICD-10-CM

## 2019-04-22 MED ORDER — LEVETIRACETAM 100 MG/ML PO SOLN: 1500.0000 mg | Freq: Two times a day (BID) | ORAL | 12 refills | Status: DC

## 2019-04-22 MED ORDER — LACOSAMIDE 200 MG PO TABS: 200.0000 mg | ORAL_TABLET | Freq: Two times a day (BID) | ORAL | 5 refills | Status: DC

## 2019-04-22 MED ORDER — CARBAMAZEPINE ER 200 MG PO CP12: 400.0000 mg | ORAL_CAPSULE | Freq: Two times a day (BID) | ORAL | 4 refills | Status: DC

## 2019-04-22 NOTE — Progress Notes (Signed)
[facility]  PATIENT: Billy Ayala DOB: 17-Aug-2000  REFERRING CLINICIAN: A Riccio, DO HISTORY FROM: mother, chart review, patient REASON FOR VISIT: follow up   HISTORICAL  CHIEF COMPLAINT:  Chief Complaint  Patient presents with  . Seizures    rm 7, 6 month FU, mom-Tequila, "no seizures since March"    HISTORY OF PRESENT ILLNESS:   UPDATE (04/22/19, VRP): Since last visit, doing well. Symptoms are stable. No seizures. No alleviating or aggravating factors. Tolerating meds. Some crying spells intermittently.   UPDATE (09/03/18, VRP): Since last visit, had mild sz (10-15 seconds) on 07/24/18. Went to ER and was stable. Since then, now on higher CBZ dosing and doing well. No more events. Intermittent mild stuttering. No other side effects.   UPDATE (05/14/18, VRP): Since last visit, had another seizure and admission in oct 2019. Had aspiration, resp failure, hemoptysis, and ICU support. Now on vimpat + levetiracetam. Some decr appetite and energy. No more seizures.    UPDATE (03/19/18, VRP): Since last visit, had 2 more seizures. LEV has been increased. Last sz no 03/11/18. No alleviating or aggravating factors. Tolerating medication.   PRIOR HPI (02/27/18): 18 year old male here for evaluation of seizures.  History of autism.  June 2019 patient was at home, collapsed and had generalized convulsions.  Patient was taken to the emergency room and then began to cough and vomit blood.  Patient was also diagnosed with diffuse alveolar hemorrhage.  He was started on antiseizure medication.  No specific etiology for hemoptysis was found.  July 2019 patient returned to the hospital for another seizure.  Again patient began to have pulmonary edema and hemoptysis.  Patient was immediately started on steroids and antiseizure medication dose was increased.  Since that time patient has been doing well.  No further seizures or hemoptysis.  He has been following up with PCP and  pulmonary clinic.  He has additional pulmonary testing scheduled for October 2019.  No prior history of seizures.  There is family history of seizure in patient's father and paternal aunt.  No side effects of antiseizure medication at this time.   REVIEW OF SYSTEMS: Full 14 system review of systems performed and negative with exception of: as per HPI.   ALLERGIES: No Known Allergies  HOME MEDICATIONS: Outpatient Medications Prior to Visit  Medication Sig Dispense Refill  . carbamazepine (CARBATROL) 200 MG 12 hr capsule Take 2 capsules (400 mg total) by mouth 2 (two) times daily. 120 capsule 12  . diphenhydrAMINE HCl, Sleep, (ZZZQUIL PO) Take 30 mLs by mouth at bedtime.    . levETIRAcetam (KEPPRA) 100 MG/ML solution Take 15 mLs (1,500 mg total) by mouth 2 (two) times daily. 900 mL 12  . VIMPAT 200 MG TABS tablet TAKE 1 TABLET BY MOUTH TWICE A DAY 60 tablet 3   No facility-administered medications prior to visit.     PAST MEDICAL HISTORY: Past Medical History:  Diagnosis Date  . Autism   . Seizure (HCC)    sz 07/24/2018  . Sickle cell trait (HCC)     PAST SURGICAL HISTORY: No past surgical history on file.  FAMILY HISTORY: Family History  Problem Relation Age of Onset  . Asthma Mother   . Hypertension Father   . Sickle cell trait Father   . Bipolar disorder Father   . Asthma Sister   . Hypertension Maternal Grandmother   . Early death Neg Hx     SOCIAL HISTORY: Social History   Socioeconomic History  .  Marital status: Single    Spouse name: Not on file  . Number of children: Not on file  . Years of education: Not on file  . Highest education level: Not on file  Occupational History  . Not on file  Social Needs  . Financial resource strain: Not on file  . Food insecurity    Worry: Not on file    Inability: Not on file  . Transportation needs    Medical: Not on file    Non-medical: Not on file  Tobacco Use  . Smoking status: Never Smoker  . Smokeless  tobacco: Never Used  Substance and Sexual Activity  . Alcohol use: Never    Frequency: Never  . Drug use: Never  . Sexual activity: Not on file  Lifestyle  . Physical activity    Days per week: Not on file    Minutes per session: Not on file  . Stress: Not on file  Relationships  . Social Musicianconnections    Talks on phone: Not on file    Gets together: Not on file    Attends religious service: Not on file    Active member of club or organization: Not on file    Attends meetings of clubs or organizations: Not on file    Relationship status: Not on file  . Intimate partner violence    Fear of current or ex partner: Not on file    Emotionally abused: Not on file    Physically abused: Not on file    Forced sexual activity: Not on file  Other Topics Concern  . Not on file  Social History Narrative   Lives with mom, three half brothers and boyfriend      Goes to [employer] full school days (6th grader)   Right handed    No caffeine      PHYSICAL EXAM  GENERAL EXAM/CONSTITUTIONAL: Vitals:  Vitals:   04/22/19 1551  BP: (!) 137/93  Pulse: 93  Temp: 98.3 F (36.8 C)  Weight: 254 lb 9.6 oz (115.5 kg)  Height: 6\' 2"  (1.88 m)   Body mass index is 32.69 kg/m. Wt Readings from Last 3 Encounters:  04/22/19 254 lb 9.6 oz (115.5 kg) (>99 %, Z= 2.46)*  09/03/18 248 lb (112.5 kg) (>99 %, Z= 2.38)*  07/24/18 229 lb (103.9 kg) (98 %, Z= 2.07)*   * Growth percentiles are based on CDC (Boys, 2-20 Years) data.    Patient is in no distress; well developed, nourished and groomed; neck is supple  CARDIOVASCULAR:  Examination of carotid arteries is normal; no carotid bruits  Regular rate and rhythm, no murmurs  Examination of peripheral vascular system by observation and palpation is normal  EYES:  Ophthalmoscopic exam of optic discs and posterior segments is normal; no papilledema or hemorrhages No exam data present  MUSCULOSKELETAL:  Gait, strength, tone, movements noted in  Neurologic exam below  NEUROLOGIC: MENTAL STATUS:  No flowsheet data found.  awake, alert, oriented to person  [facility] Inc[facility]CR memory   normal attention and concentration  DECR FLUENCY; FOLLOW SIMPLE COMMANDS  fund of knowledge appropriate  CRANIAL NERVE:   2nd - no papilledema on fundoscopic exam  2nd, 3rd, 4th, 6th - pupils equal and reactive to light, visual fields full to confrontation, extraocular muscles intact, no nystagmus  5th - facial sensation symmetric  7th - facial strength symmetric  8th - hearing intact  9th - palate elevates symmetrically, uvula midline  11th - shoulder shrug symmetric  12th -  tongue protrusion midline  MOTOR:   normal bulk and tone, full strength in the BUE, BLE  SENSORY:   normal and symmetric to light touch  COORDINATION:   finger-nose-finger, fine finger movements normal  REFLEXES:   deep tendon reflexes present and symmetric  GAIT/STATION:   narrow based gait     DIAGNOSTIC DATA (LABS, IMAGING, TESTING) - I reviewed patient records, labs, notes, testing and imaging myself where available.  Lab Results  Component Value Date   WBC 9.2 05/03/2018   HGB 14.9 05/03/2018   HCT 48.2 05/03/2018   MCV 75.2 (L) 05/03/2018   PLT 416 (H) 05/03/2018      Component Value Date/Time   NA 141 05/03/2018 0955   K 4.3 05/03/2018 0955   CL 101 05/03/2018 0955   CO2 30 05/03/2018 0955   GLUCOSE 125 (H) 05/03/2018 0955   BUN 15 05/03/2018 0955   CREATININE 0.86 05/03/2018 0955   CALCIUM 9.7 05/03/2018 0955   PROT 7.3 04/27/2018 2058   ALBUMIN 3.9 04/27/2018 2058   AST 32 04/27/2018 2058   ALT 25 04/27/2018 2058   ALKPHOS 103 04/27/2018 2058   BILITOT 0.5 04/27/2018 2058   GFRNONAA >60 05/03/2018 0955   GFRAA >60 05/03/2018 0955   Lab Results  Component Value Date   TRIG 142 04/30/2018   No results found for: HGBA1C Lab Results  Component Value Date   VITAMINB12 248 01/15/2018   Lab Results  Component Value Date    TSH 0.422 12/14/2017    12/13/17 EEG - This is a normal sleep/sedated EEG for the patients stated age.  There were no focal, hemispheric or lateralizing features.  No epileptiform activity was recorded.  Comment cannot be made on waking rhythm as the patient was sedated with Versed and Precedex.  A normal EEG does not exclude the diagnosis of a seizure disorder and if seizure remains high on the list of differential diagnosis, a repeat EEG when off of sedation may be of value.  Correlate clinically.  12/13/17 MRI brain [I reviewed images myself and agree with interpretation. -VRP]  - Unremarkable appearance of the brain.    ASSESSMENT AND PLAN  18 y.o. year old male here with autism and seizure disorder.    Initial 2 seizures may have triggered pulmonary edema and hemoptysis. Other possibility is underlying pulmonary disease (autoimmune) leading to hypoxia and seizure secondarily.  I would favor that patient had seizure as the initial event for both admissions; this is based on history per family.   Dx: primary generalized epilepsy  1. Seizure disorder (HCC)   2. Autism     PLAN:  - continue levetiracetam  twice a day + vimpat  twice a day + carbamazepine  twice a day   - in future may consider adding lamotrigine, topiramate, depakote, onfi or phenobarbital  - Please maintain precautions. Do not participate in activities where a loss of awareness could harm you or someone else. No swimming alone, no tub bathing, no hot tubs, no driving, no operating motorized vehicles (cars, ATVs, motocycles, etc), lawnmowers, power tools or firearms. No standing at heights, such as rooftops, ladders or stairs. Avoid hot objects such as stoves, heaters, open fires. Wear a helmet when riding a bicycle, scooter, skateboard, etc. and avoid areas of traffic. Set your water heater to 120 degrees or less.  Meds ordered this encounter  Medications  . lacosamide (VIMPAT) 200 MG TABS tablet     Sig: Take 1 tablet (200 mg total)  by mouth 2 (two) times daily.    Dispense:  60 tablet    Refill:  5    This request is for a new prescription for a controlled substance as required by Federal/State law..  . levETIRAcetam (KEPPRA) 100 MG/ML solution    Sig: Take 15 mLs (1,500 mg total) by mouth 2 (two) times daily.    Dispense:  900 mL    Refill:  12  . carbamazepine (CARBATROL) 200 MG 12 hr capsule    Sig: Take 2 capsules (400 mg total) by mouth 2 (two) times daily.    Dispense:  360 capsule    Refill:  4   Return in about 1 year (around 04/21/2020).    Penni Bombard, MD 39/09/90, 3:30 PM Certified in Neurology, Neurophysiology and Neuroimaging  [facility] [address] [place], [place] [address] [phone]

## 2019-05-17 IMAGING — CT CT CHEST HIGH RESOLUTION W/O CM
2 of 5 series · 15 of 36 positions shown, 18 images · non-contrast
Comparison: 01/14/2018 and 12/12/2017.

CLINICAL DATA: Multicystic, induration, ground-glass opacification.

EXAM:
CT CHEST WITHOUT CONTRAST
TECHNIQUE: Multidetector CT imaging of the chest was performed following the
standard protocol without intravenous contrast. High resolution
imaging of the lungs, as well as inspiratory and expiratory imaging,
was performed.

[Series 6: high resolution · axial · 0.62mm/px · z∈[+19,+241]mm · 12 of 123 slices shown, 15 images]
[im 6/123  mediastinal]
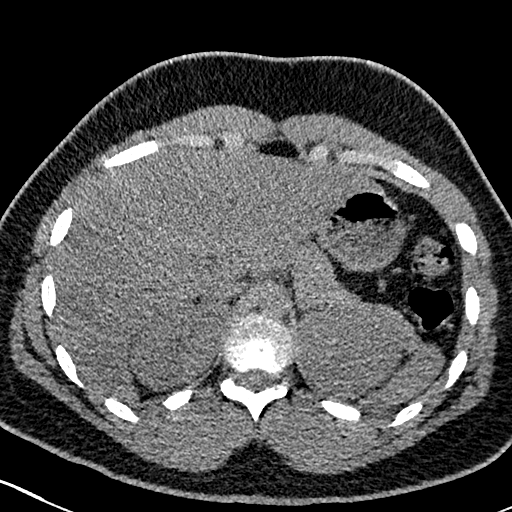
[im 6/123  lung]
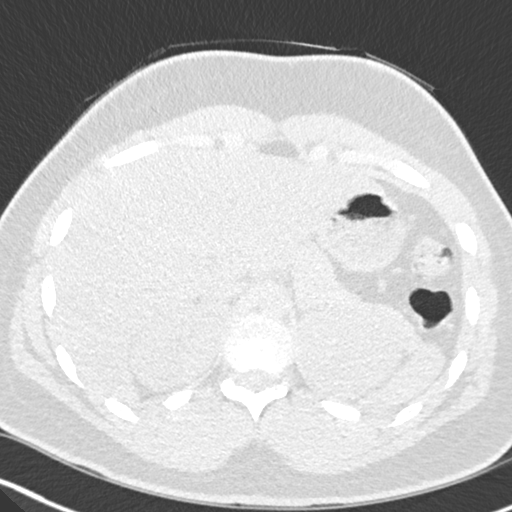
[im 18/123  lung]
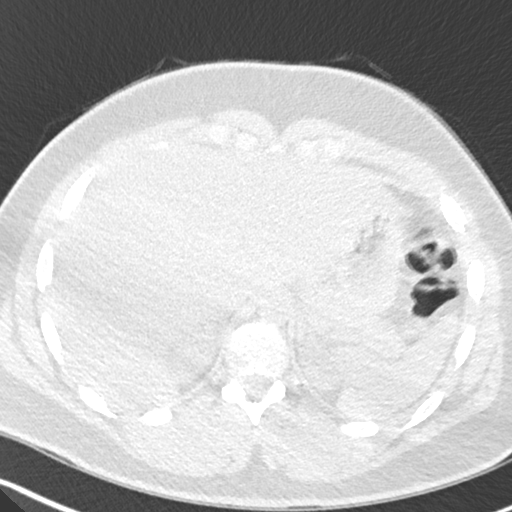
[im 30/123  lung]
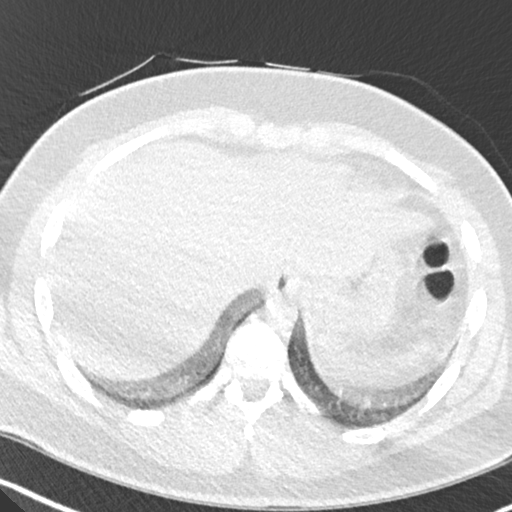
[im 35/123  lung]
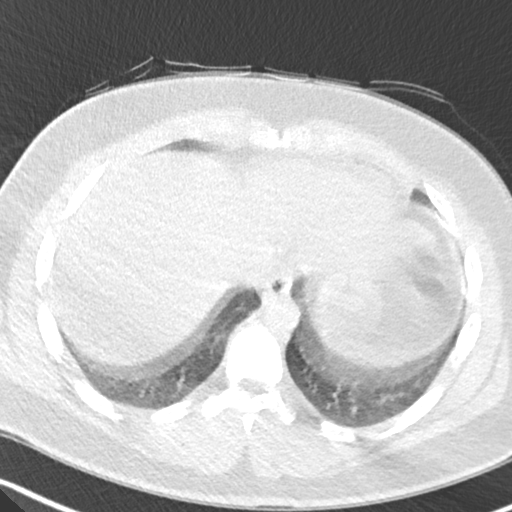
[im 47/123  mediastinal]
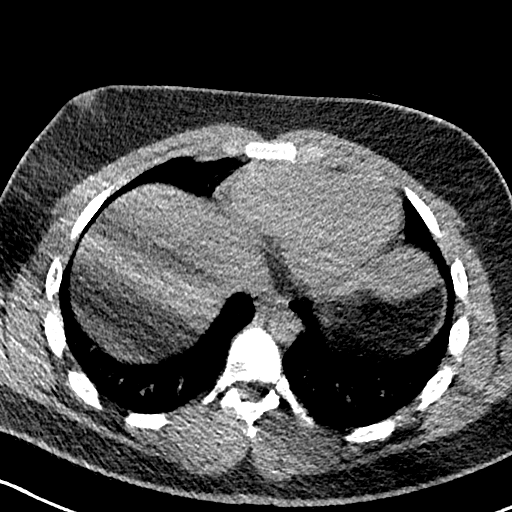
[im 47/123  lung]
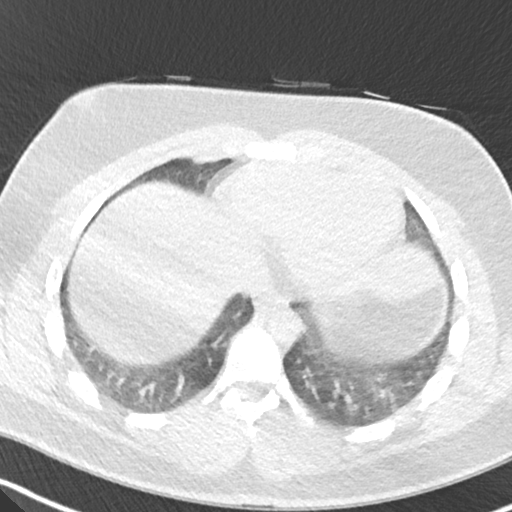
[im 59/123  lung]
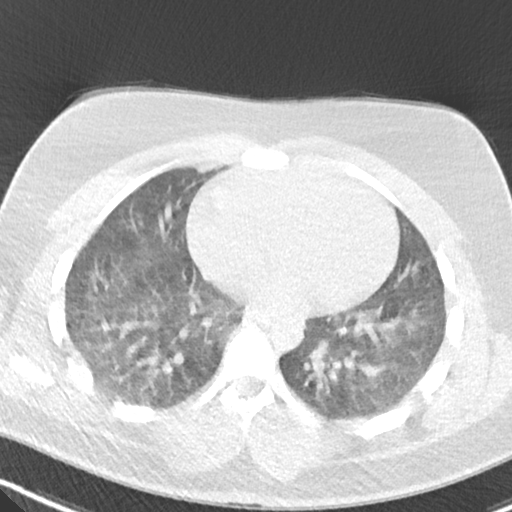
[im 64/123  lung]
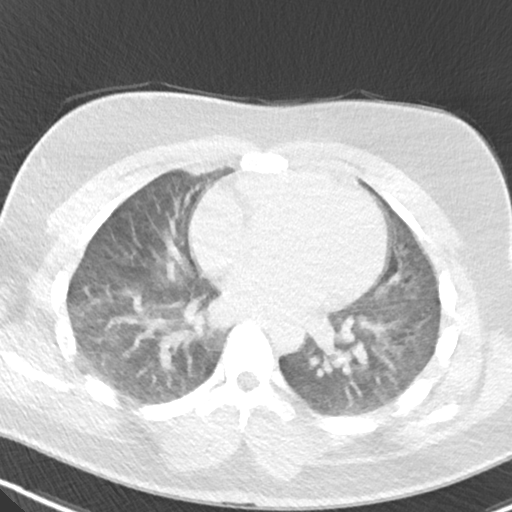
[im 76/123  lung]
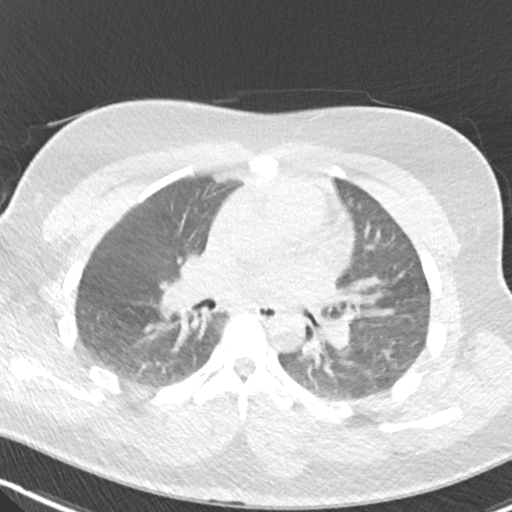
[im 88/123  mediastinal]
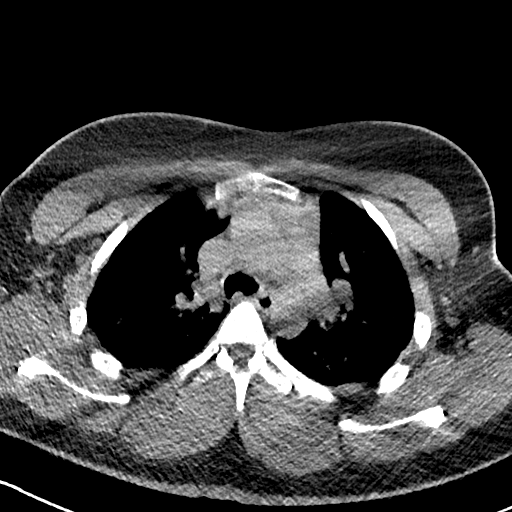
[im 88/123  lung]
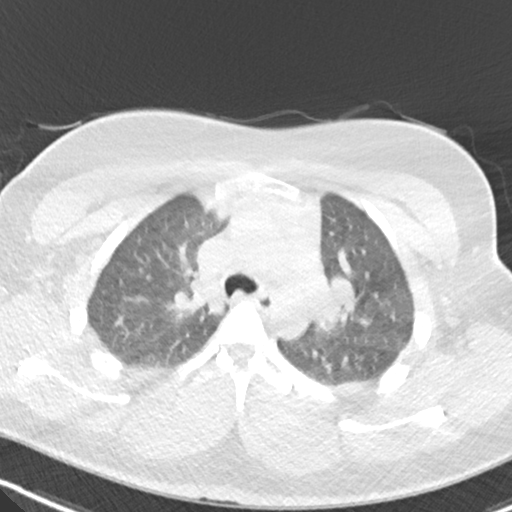
[im 93/123  lung]
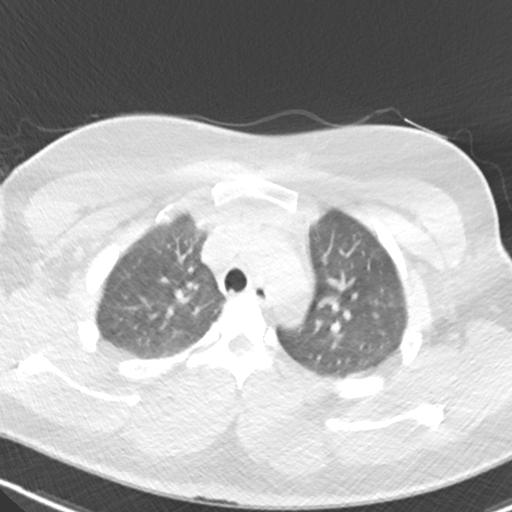
[im 105/123  lung]
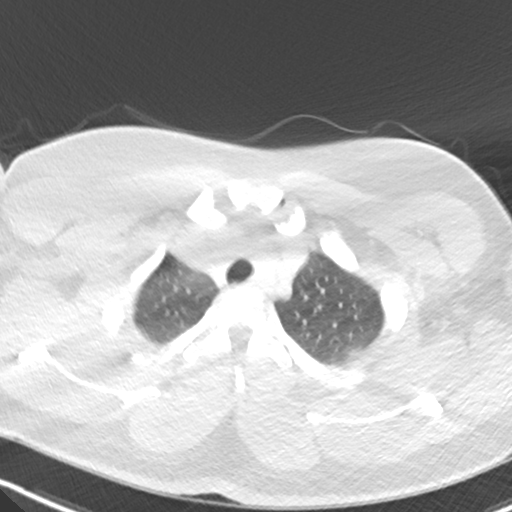
[im 117/123  lung]
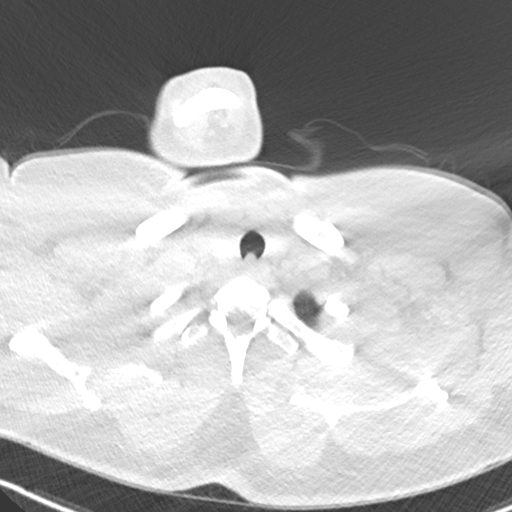

[Series 9: coronal · coronal · 0.51mm/px · 3 of 110 slices shown]
[im 22/110  lung]
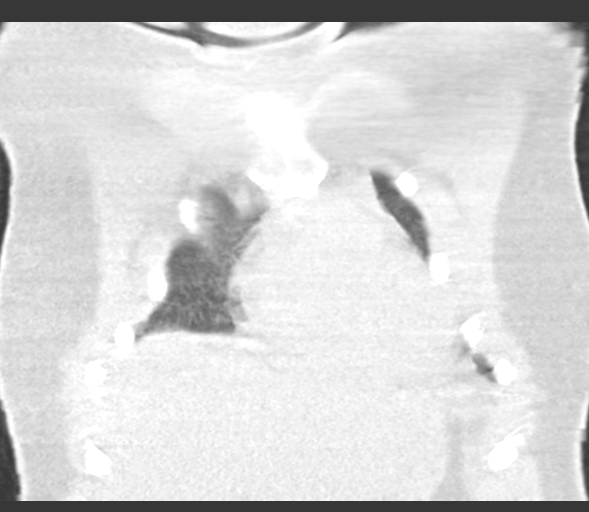
[im 44/110  lung]
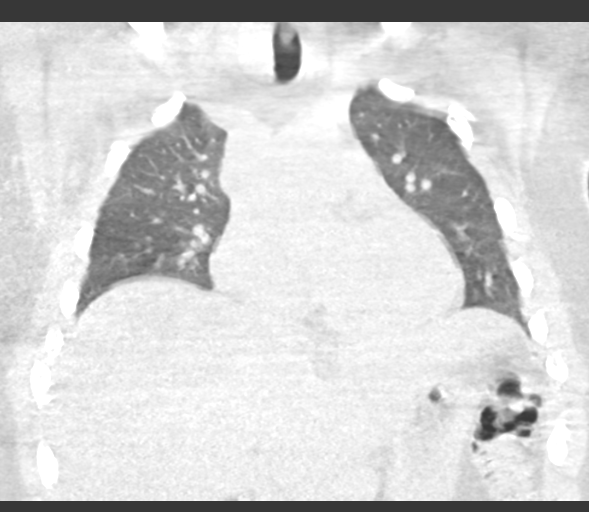
[im 66/110  lung]
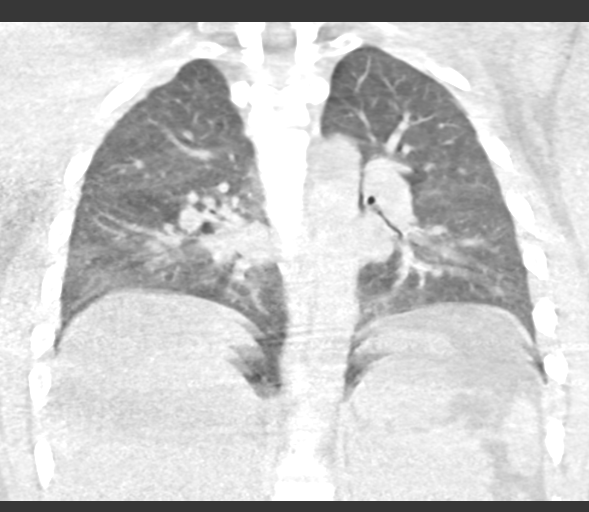

[15 of 36 positions shown; findings below may reference images not displayed]

FINDINGS: Cardiovascular: Vascular structures are unremarkable. Heart is at
the upper limits of normal in size. No pericardial effusion.

Mediastinum/Nodes: No pathologically enlarged mediastinal or
axillary lymph nodes. Hilar regions are difficult to evaluate
without IV contrast and appear grossly unremarkable. Esophagus is
grossly unremarkable.

Lungs/Pleura: Image quality is markedly degraded by expiratory phase
imaging and respiratory motion. No definitive subpleural
reticulation, traction bronchiectasis/bronchiolectasis, abnormal
ground-glass, architectural distortion or honeycombing. Inspiratory
and expiratory imaging appears to be in similar phases of
respiration, limiting the evaluation for air trapping. No pleural
fluid. Airway is unremarkable.

Upper Abdomen: Visualized portions of the liver, adrenal glands,
kidneys, spleen, pancreas, stomach and bowel are grossly
unremarkable.

Musculoskeletal: Negative.
IMPRESSION: Imaging is limited by expiratory phase imaging and respiratory
motion. No definitive evidence of interstitial lung disease.

## 2019-05-22 ENCOUNTER — Telehealth: Payer: Self-pay | Admitting: Neurology

## 2019-05-22 NOTE — Telephone Encounter (Signed)
Patient had a "very mild seizure and is fine now". He was in the shower, sitting on the ground, no trauma, shaking "a little", eyes open, lasted 90 secs, no missing medications, no fevers or chills, no significant post-ictal state, now back to baseline. I did not suggest any medication changes, please call mother tomorrow if you have any changes to suggest.

## 2019-05-23 ENCOUNTER — Telehealth: Payer: Self-pay | Admitting: Diagnostic Neuroimaging

## 2019-05-23 NOTE — Telephone Encounter (Signed)
May increase carbamazepine to 600mg  twice a day. -VRP

## 2019-05-23 NOTE — Telephone Encounter (Signed)
Called mother and informed her Dr Leta Baptist advises to increase carbamazepine to 600 mg twice a day. I advised she do this x 1 week and call us back to let us know how he is doing. A new Rx can be sent in as needed. She repeated directions correctly, verbalized understanding, appreciation.

## 2019-05-23 NOTE — Telephone Encounter (Signed)
Pt's mother called stating that the pt had a mild seizure last night and she is wondering if she is needing to bring him in or if his medications need to be adjusted. Please advise.

## 2019-05-29 MED ORDER — CARBAMAZEPINE ER 200 MG PO CP12: 600.0000 mg | ORAL_CAPSULE | Freq: Two times a day (BID) | ORAL | 4 refills | Status: DC

## 2019-05-29 NOTE — Telephone Encounter (Addendum)
Called mother for update. She stated the patient is doing great on carbamazepine 600 mg twice a day.  She asked that the pharmacy be given an updated Rx. I advised we will sent that in and she should call us with any questions or concerns.  She verbalized understanding, appreciation.

## 2019-05-29 NOTE — Addendum Note (Signed)
Addended by: Andrey Spearman R on: 05/29/2019 03:29 PM   Modules accepted: Orders

## 2019-06-06 IMAGING — DX DG CHEST 1V PORT
1 series · 1 of 1 positions shown · non-contrast
Comparison: 04/27/2018.

CLINICAL DATA: Acute respiratory failure with hypoxia.

EXAM:
PORTABLE CHEST 1 VIEW

[chest ap]
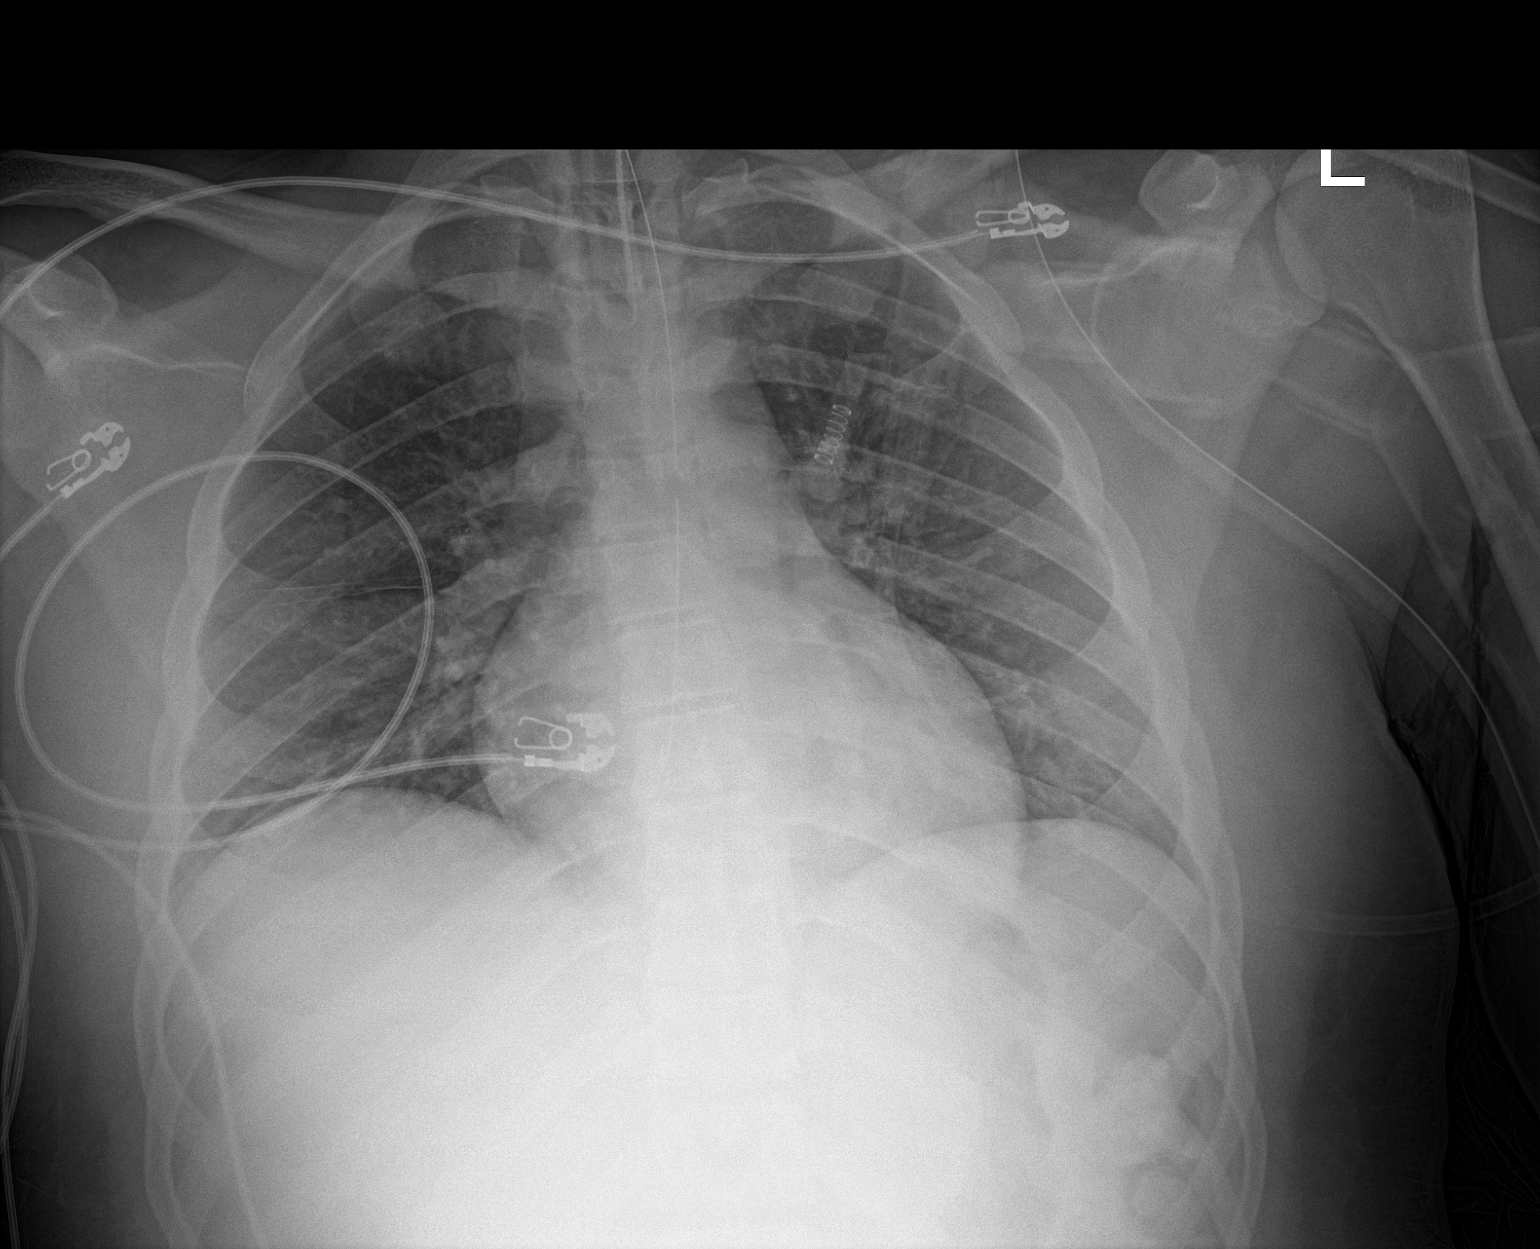

[1 of 1 positions shown; findings below may reference images not displayed]

FINDINGS: Endotracheal tube in satisfactory position. Nasogastric tube tip and
side hole in the esophagus. Resolved bilateral airspace opacities.
The interstitial markings remain mildly prominent with no Kerley
lines or pleural fluid. Normal appearing bones.
IMPRESSION: 1. Resolved bilateral alveolar edema.
2. Stable mild chronic interstitial lung disease.
3. Nasogastric tube tip and side hole in the esophagus. It is
recommended that this be advanced 18 cm.

## 2019-06-06 IMAGING — DX DG ABD PORTABLE 1V
1 series · 1 of 1 positions shown · non-contrast
Comparison: Chest radiograph dated 04/29/2018

CLINICAL DATA: 16-year-old male with interval advancement of the NG
tube.

EXAM:
PORTABLE ABDOMEN - 1 VIEW

[abdomen]
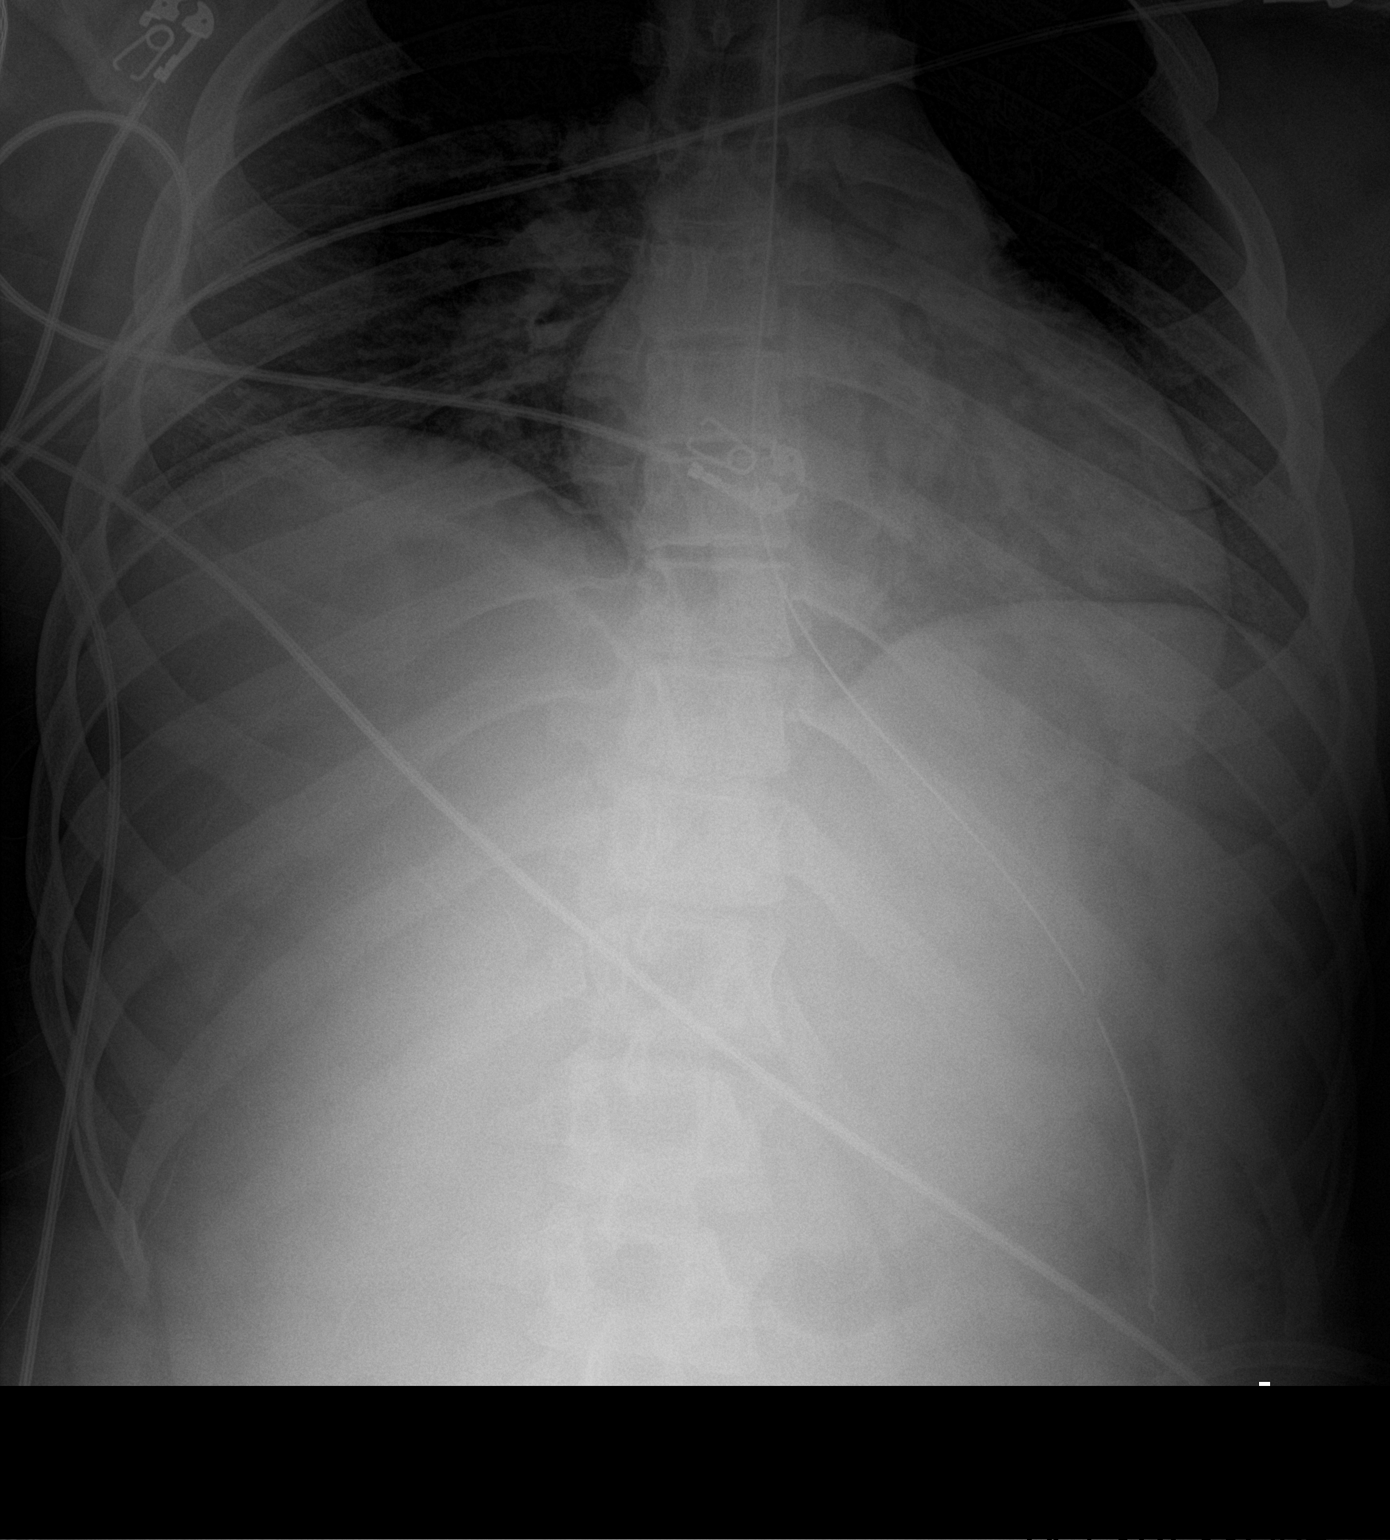

[1 of 1 positions shown; findings below may reference images not displayed]

FINDINGS: An enteric tube is partially visualized with tip and side-port in
the body of the stomach. No interval change in the appearance of the
visualized lower lungs and upper abdomen.
IMPRESSION: Enteric tube with tip and side-port in the body of the stomach.

## 2019-11-10 ENCOUNTER — Other Ambulatory Visit: Payer: Self-pay | Admitting: Diagnostic Neuroimaging

## 2019-11-10 DIAGNOSIS — G40909 Epilepsy, unspecified, not intractable, without status epilepticus: Secondary | ICD-10-CM

## 2020-02-19 ENCOUNTER — Ambulatory Visit (INDEPENDENT_AMBULATORY_CARE_PROVIDER_SITE_OTHER): Payer: Medicaid Other | Admitting: Family Medicine

## 2020-02-19 ENCOUNTER — Other Ambulatory Visit: Payer: Self-pay

## 2020-02-19 ENCOUNTER — Encounter: Payer: Self-pay | Admitting: Family Medicine

## 2020-02-19 VITALS — BP 160/120 | HR 102 | Ht 73.47 in | Wt 258.0 lb

## 2020-02-19 DIAGNOSIS — L2082 Flexural eczema: Secondary | ICD-10-CM

## 2020-02-19 DIAGNOSIS — E6609 Other obesity due to excess calories: Secondary | ICD-10-CM | POA: Diagnosis not present

## 2020-02-19 DIAGNOSIS — Z0001 Encounter for general adult medical examination with abnormal findings: Secondary | ICD-10-CM

## 2020-02-19 DIAGNOSIS — Z0000 Encounter for general adult medical examination without abnormal findings: Secondary | ICD-10-CM

## 2020-02-19 DIAGNOSIS — Z6833 Body mass index (BMI) 33.0-33.9, adult: Secondary | ICD-10-CM

## 2020-02-19 MED ORDER — HALOBETASOL PROPIONATE 0.05 % EX CREA: TOPICAL_CREAM | Freq: Two times a day (BID) | CUTANEOUS | 0 refills | Status: DC

## 2020-02-19 NOTE — Patient Instructions (Addendum)
F/U in 6 weeks for lab work and immunization, school form.  Work on starting to take 30 minute walks everyday to increase exercise.  Use halobetasol cream twice a day for two weeks on back of neck and inside of elbows. Do not use on face near eyes or mouth, do not use longer than 2 weeks at a time. Follow up if no improvement.  Health Maintenance, Male Adopting a healthy lifestyle and getting preventive care are important in promoting health and wellness. Ask your health care provider about:  The right schedule for you to have regular tests and exams.  Things you can do on your own to prevent diseases and keep yourself healthy. What should I know about diet, weight, and exercise? Eat a healthy diet   Eat a diet that includes plenty of vegetables, fruits, low-fat dairy products, and lean protein.  Do not eat a lot of foods that are high in solid fats, added sugars, or sodium. Maintain a healthy weight Body mass index (BMI) is a measurement that can be used to identify possible weight problems. It estimates body fat based on height and weight. Your health care provider can help determine your BMI and help you achieve or maintain a healthy weight. Get regular exercise Get regular exercise. This is one of the most important things you can do for your health. Most adults should:  Exercise for at least 150 minutes each week. The exercise should increase your heart rate and make you sweat (moderate-intensity exercise).  Do strengthening exercises at least twice a week. This is in addition to the moderate-intensity exercise.  Spend less time sitting. Even light physical activity can be beneficial. Watch cholesterol and blood lipids Have your blood tested for lipids and cholesterol at 21 years of age, then have this test every 5 years. You may need to have your cholesterol levels checked more often if:  Your lipid or cholesterol levels are high.  You are older than 21 years of age.  You  are at high risk for heart disease. What should I know about cancer screening? Many types of cancers can be detected early and may often be prevented. Depending on your health history and family history, you may need to have cancer screening at various ages. This may include screening for:  Colorectal cancer.  Prostate cancer.  Skin cancer.  Lung cancer. What should I know about heart disease, diabetes, and high blood pressure? Blood pressure and heart disease  High blood pressure causes heart disease and increases the risk of stroke. This is more likely to develop in people who have high blood pressure readings, are of African descent, or are overweight.  Talk with your health care provider about your target blood pressure readings.  Have your blood pressure checked: ? Every 3-5 years if you are 81-56 years of age. ? Every year if you are 40 years old or older.  If you are between the ages of 27 and 50 and are a current or former smoker, ask your health care provider if you should have a one-time screening for abdominal aortic aneurysm (AAA). Diabetes Have regular diabetes screenings. This checks your fasting blood sugar level. Have the screening done:  Once every three years after age 103 if you are at a normal weight and have a low risk for diabetes.  More often and at a younger age if you are overweight or have a high risk for diabetes. What should I know about preventing infection? Hepatitis B If  you have a higher risk for hepatitis B, you should be screened for this virus. Talk with your health care provider to find out if you are at risk for hepatitis B infection. Hepatitis C Blood testing is recommended for:  Everyone born from [birth year] through 1967.  Anyone with known risk factors for hepatitis C. Sexually transmitted infections (STIs)  You should be screened each year for STIs, including gonorrhea and chlamydia, if: ? You are sexually active and are younger than 21 years  of age. ? You are older than 21 years of age and your health care provider tells you that you are at risk for this type of infection. ? Your sexual activity has changed since you were last screened, and you are at increased risk for chlamydia or gonorrhea. Ask your health care provider if you are at risk.  Ask your health care provider about whether you are at high risk for HIV. Your health care provider may recommend a prescription medicine to help prevent HIV infection. If you choose to take medicine to prevent HIV, you should first get tested for HIV. You should then be tested every 3 months for as long as you are taking the medicine. Follow these instructions at home: Lifestyle  Do not use any products that contain nicotine or tobacco, such as cigarettes, e-cigarettes, and chewing tobacco. If you need help quitting, ask your health care provider.  Do not use street drugs.  Do not share needles.  Ask your health care provider for help if you need support or information about quitting drugs. Alcohol use  Do not drink alcohol if your health care provider tells you not to drink.  If you drink alcohol: ? Limit how much you have to 0-2 drinks a day. ? Be aware of how much alcohol is in your drink. In the [place] one drink equals one 12 oz bottle of beer (355 mL), one 5 oz glass of wine (148 mL), or one 1 oz glass of hard liquor (44 mL). General instructions  Schedule regular health, dental, and eye exams.  Stay current with your vaccines.  Tell your health care provider if: ? You often feel depressed. ? You have ever been abused or do not feel safe at home. Summary  Adopting a healthy lifestyle and getting preventive care are important in promoting health and wellness.  Follow your health care provider's instructions about healthy diet, exercising, and getting tested or screened for diseases.  Follow your health care provider's instructions on monitoring your cholesterol and blood  pressure. This information is not intended to replace advice given to you by your health care provider. Make sure you discuss any questions you have with your health care provider. Document Revised: 06/13/2018 Document Reviewed: 06/13/2018 Elsevier Patient Education  2020 [employer].

## 2020-02-20 ENCOUNTER — Encounter: Payer: Self-pay | Admitting: Family Medicine

## 2020-02-20 DIAGNOSIS — Z0000 Encounter for general adult medical examination without abnormal findings: Secondary | ICD-10-CM | POA: Insufficient documentation

## 2020-02-20 NOTE — Progress Notes (Signed)
SUBJECTIVE:   CHIEF COMPLAINT / HPI: physical exam  19 yo male patient w/ h/o ASD, intellectual disability, epilepsy, sickle cell trait, h/o ARF with difficult intubation, and obesity presenting today for physical. Mother has paperwork for school to certify staying at home and receiving services. Patient is not yet vaccinated for COVID-19 and is at high risk of complications if infected. Discussed vaccination with mother, she is in agreement to get the vaccine, but she has to assist in administration (patient unable to cooperate on his own, lacks capacity to make decision), and mother currently is in wheelchair with foot injury and cannot physically assist in obtaining blood work or vaccines. Patient does not smoke, use drugs, drink alcohol, is not sexually active.   Obesity: discussed incorporating more fruits and vegetables in diet. Patient does not drink soda, only water. Does not exercise. Discussed ways to help increase exercise and balanced diet. Would like to obtain A1c and lipid panel to assess for DM and hyperlipidemia, but unable to do so for aforementioned reasons.   Eczema: patient with h/o flexural eczema, lichenification present on back of neck and AC fossae, mother notes getting much worse, no current medications.  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  Constitutional:      General: He is not in acute distress.    Appearance: Normal appearance. He is obese. He is not ill-appearing, toxic-appearing or diaphoretic.  HENT:     Head: Normocephalic and atraumatic.     Ears:     Comments: Unable to examine ears, patient would not allow     Mouth/Throat:     Mouth: Mucous membranes are moist.     Pharynx: Oropharynx is clear. No oropharyngeal exudate.  Eyes:     Conjunctiva/sclera: Conjunctivae normal.     Pupils: Pupils are equal, round, and reactive to light.  Cardiovascular:     Rate and Rhythm: Normal rate and regular rhythm.     Pulses: Normal pulses.     Heart sounds: Normal heart sounds. No murmur heard.  No friction rub. No gallop.   Pulmonary:     Effort: Pulmonary effort is normal. No respiratory distress.     Breath sounds: Normal breath sounds. No wheezing, rhonchi or rales.  Abdominal:     General: Abdomen is flat. Bowel sounds are normal. There is no distension.     Palpations: Abdomen is soft.     Tenderness: There is no abdominal tenderness. There is no guarding.  Musculoskeletal:     Cervical back: Normal range of motion and neck supple. No rigidity.     Right lower leg: No edema.     Left lower leg: No edema.  Lymphadenopathy:     Cervical: No cervical adenopathy.  Skin:    General: Skin is warm and dry.     Findings: Rash present.     Comments: Lichenification present along back of neck, b/l AC fossae  Neurological:     General: No focal deficit present.     Mental Status: He is alert. Mental status is at baseline.     Sensory: No sensory deficit.  Coordination: Coordination normal.     Deep Tendon Reflexes: Reflexes normal.  Psychiatric:        Mood and Affect: Mood normal.        Behavior: Behavior normal.    ASSESSMENT/PLAN:   Obesity (BMI 30.0-34.9) Unable to obtain A1c, lipid panel today, will do so when mom healed, f/u 6 weeks. - discussed balanced diet and adding in more fruits and vegetables in place of processed flour/meat for snacks and meals - discussed implementing exercise regimen for patient with 41 yo sibling, start walking 30 minutes every day   Healthcare maintenance Due to comorbidities of epilepsy, obesity, ASD, and intellectual disability combined with difficult  intubation history and h/o ARF, this patient is at high risk of having complications if he contracted COVID-19 in comparison to the average 21 yo. Patient is unable to be immunized at the moment due to lack of capacity and mother's injury (mother has to assist in obtaining immunizations, blood work, Catering manager). Completed form for [employer] schools to have patient remain at home and receive services there due to high risk status. Will f/u in 6 weeks to reassess and attempt vaccination at that point if mother healed.  Eczema Large amount of lichenification on neck and AC fossae, w/ strong h/o of eczema in the past. - halobetasol 0.05% cr use BID x2 weeks on back of neck, AC fossae - take luke-warm bath once daily followed by vaseline/aquafor application for emollient - F/U in 6 weeks     Shirlean Mylar, MD [facility]

## 2020-02-20 NOTE — Assessment & Plan Note (Signed)
Due to comorbidities of epilepsy, obesity, ASD, and intellectual disability combined with difficult intubation history and h/o ARF, this patient is at high risk of having complications if he contracted COVID-19 in comparison to the average 21 yo. Patient is unable to be immunized at the moment due to lack of capacity and mother's injury (mother has to assist in obtaining immunizations, blood work, Catering manager). Completed form for [employer] schools to have patient remain at home and receive services there due to high risk status. Will f/u in 6 weeks to reassess and attempt vaccination at that point if mother healed.

## 2020-02-20 NOTE — Assessment & Plan Note (Signed)
Unable to obtain A1c, lipid panel today, will do so when mom healed, f/u 6 weeks. - discussed balanced diet and adding in more fruits and vegetables in place of processed flour/meat for snacks and meals - discussed implementing exercise regimen for patient with 21 yo sibling, start walking 30 minutes every day

## 2020-02-20 NOTE — Assessment & Plan Note (Signed)
Large amount of lichenification on neck and AC fossae, w/ strong h/o of eczema in the past. - halobetasol 0.05% cr use BID x2 weeks on back of neck, AC fossae - take luke-warm bath once daily followed by vaseline/aquafor application for emollient - F/U in 6 weeks

## 2020-03-13 ENCOUNTER — Telehealth: Payer: Self-pay | Admitting: Family Medicine

## 2020-03-13 NOTE — Telephone Encounter (Signed)
Community Program for disable adults form dropped off at front desk for completion.  Verified that patient section of form has been completed.  Last DOS/WCC with PCP was 02/19/20.  Placed form in team folder to be completed by clinical staff.  Vilinda Blanks

## 2020-03-13 NOTE — Telephone Encounter (Signed)
Clinical info completed on  Community Program for Adults form.  Place form in PCP's box for completion.  Aquilla Solian, CMA

## 2020-03-16 NOTE — Telephone Encounter (Signed)
Spoke with pts mother. Informed her that forms are ready to be picked up. Pts mother understood.Aquilla Solian, CMA

## 2020-03-16 NOTE — Telephone Encounter (Signed)
Form completed and placed at the front for pick up by family.  Shirlean Mylar, MD [facility], PGY-2

## 2020-03-31 ENCOUNTER — Encounter: Payer: Self-pay | Admitting: Family Medicine

## 2020-03-31 ENCOUNTER — Other Ambulatory Visit: Payer: Self-pay

## 2020-03-31 ENCOUNTER — Ambulatory Visit (INDEPENDENT_AMBULATORY_CARE_PROVIDER_SITE_OTHER): Payer: Medicaid Other | Admitting: Family Medicine

## 2020-03-31 DIAGNOSIS — Z0000 Encounter for general adult medical examination without abnormal findings: Secondary | ICD-10-CM | POA: Diagnosis not present

## 2020-03-31 DIAGNOSIS — F840 Autistic disorder: Secondary | ICD-10-CM | POA: Diagnosis not present

## 2020-03-31 DIAGNOSIS — L309 Dermatitis, unspecified: Secondary | ICD-10-CM

## 2020-03-31 NOTE — Assessment & Plan Note (Addendum)
Patient requires help with ADLs and mother is interested in [facility] as well as other services available to him as she may have to stop working to care for him full time. Will place a CCM consult so that mother can speak with our social worker, Sammuel Hines.

## 2020-03-31 NOTE — Progress Notes (Signed)
    SUBJECTIVE:   CHIEF COMPLAINT / HPI: immunization and form  Immunization: patient is here today for COVID-19 vaccination, his mom is able to help administer this to him.  Forms: mother is trying to get approval from school to continue special therapies including allowing him to receive them while in virtual school at home. Patient has severe ASD and is unable to do his ADLs alone: he needs help bathing, eating and preparing food, using the bathroom. He can dress on his own but has trouble planning. He has wandered off before and needs supervision for safety at home. His mother is interested in [facility]. She requests help with PCS form and with obtainng other services that would be appropriate for him. She would like a CCM consult placed to discuss with social worker, Sammuel Hines.  Eczema: his eczema is improving with intermittent triamcinolone. Every two weeks mother is using triamcinolone and his skin is improving; lichenification on his neck has decreased.  PERTINENT  PMH / PSH: ASD, seizure disorder, sickle cell trait, eczema  OBJECTIVE:   BP 110/62   Pulse 92   Wt 257 lb 3.2 oz (116.7 kg)   SpO2 99%   BMI 33.51 kg/m   Physical Exam Vitals and nursing note reviewed.  Constitutional:      General: He is not in acute distress.    Appearance: Normal appearance. He is obese. He is not ill-appearing or toxic-appearing.  HENT:     Head: Normocephalic and atraumatic.  Eyes:     Conjunctiva/sclera: Conjunctivae normal.     Pupils: Pupils are equal, round, and reactive to light.  Cardiovascular:     Rate and Rhythm: Normal rate and regular rhythm.     Pulses: Normal pulses.     Heart sounds: Normal heart sounds. No murmur heard.  No friction rub. No gallop.   Pulmonary:     Effort: Pulmonary effort is normal.     Breath sounds: Normal breath sounds.  Abdominal:     General: Abdomen is flat. Bowel sounds are normal.     Palpations: Abdomen is soft.  Musculoskeletal:     Right  lower leg: No edema.     Left lower leg: No edema.  Skin:    General: Skin is warm and dry.     Findings: Rash present.     Comments: Lichenification improving around neck  Neurological:     General: No focal deficit present.     Mental Status: He is alert. Mental status is at baseline.  Psychiatric:        Mood and Affect: Mood normal.        Behavior: Behavior normal.    ASSESSMENT/PLAN:   Autism spectrum disorder Patient requires help with ADLs and mother is interested in [facility] as well as other services available to him as she may have to stop working to care for him full time. Will place a CCM consult so that mother can speak with our social worker, Sammuel Hines.   Eczema Improved lichenification. Continue halobetasol cr x2w at a time.  Healthcare maintenance Attempted to give COVID-19 immunization, but patient was not able to tolerate. Too dangerous to give without physical cooperation. Will try again when patient able to tolerate better.     Shirlean Mylar, MD [facility]

## 2020-03-31 NOTE — Patient Instructions (Signed)
It was a pleasure to see you!  For your immunizations, we can try again when you are more ready. Please call to schedule at your convenience.  For your eczema: continue with luke-warm baths, vaseline daily, and halobetasol for 2 weeks at a time, then give rest for 2 weeks.  For assistance with community services, I have placed a consult to our social worker, Sammuel Hines, who will call you in the next week or so.  Be Well!  Dr. Leary Roca

## 2020-03-31 NOTE — Assessment & Plan Note (Signed)
Improved lichenification. Continue halobetasol cr x2w at a time.

## 2020-03-31 NOTE — Assessment & Plan Note (Signed)
Attempted to give COVID-19 immunization, but patient was not able to tolerate. Too dangerous to give without physical cooperation. Will try again when patient able to tolerate better.

## 2020-04-01 ENCOUNTER — Telehealth: Payer: Self-pay | Admitting: *Deleted

## 2020-04-01 NOTE — Chronic Care Management (AMB) (Signed)
  Care Management   Note  04/01/2020 Name: Billy Ayala MRN: [id number] DOB: 06-02-2001  Billy Ayala is a 19 y.o. year old male who is a primary care patient of Shirlean Mylar, MD. I reached out to Gabriel Carina by phone today in response to a referral sent by [name] PCP, Shirlean Mylar, MD.  [name] was given information about care management services today including:  1. Care management services include personalized support from designated clinical staff supervised by his physician, including individualized plan of care and coordination with other care providers 2. 24/7 contact phone numbers for assistance for urgent and routine care needs. 3. The patient may stop care management services at any time by phone call to the office staff.  [name]  verbally agreed to assistance and services provided by embedded care coordination/care management team today.  Follow up plan: Telephone appointment with care management team member scheduled for:04/07/2020  [facility] Guide, Embedded Care Coordination [facility] Management

## 2020-04-06 ENCOUNTER — Other Ambulatory Visit: Payer: Self-pay | Admitting: Family Medicine

## 2020-04-06 DIAGNOSIS — L2082 Flexural eczema: Secondary | ICD-10-CM

## 2020-04-07 ENCOUNTER — Ambulatory Visit: Payer: Medicaid Other | Admitting: Licensed Clinical Social Worker

## 2020-04-07 DIAGNOSIS — Z741 Need for assistance with personal care: Secondary | ICD-10-CM

## 2020-04-07 DIAGNOSIS — Z789 Other specified health status: Secondary | ICD-10-CM

## 2020-04-07 NOTE — Chronic Care Management (AMB) (Signed)
Care Management   Clinical Social Work initial Note  04/07/2020 Name: Billy Ayala MRN: [id number] DOB: [birth year] Billy Ayala is a 19 y.o. year old male who sees Shirlean Mylar, MD for primary care. The Care Management team was consulted by PCP to assist the patient with Level of Care Concerns.Marland Kitchen  LCSW reached out to Smaran Q Wildey's mother Annitta [place] today by phone to introduce self, assess needs and barriers to care.    Assessment: Patient currently received PCS services and will be transitioning to Adult PCS at the end of the month.  This will decrease the amount of PCS hours he currently has with the child's PCS program.  Mom has applied for CAP program but it on the wait list.     Recommendation: Patient may benefit from, and mom is in agreement to apply for guardianship for patient as mom reports patient is non-verbal and unable to make any decision for himself. See care plan below for details Plan: LCSW will F/U with patient's mother in 2 weeks.  Review of patient status, including review of consultants reports, relevant laboratory and other test results, and collaboration with appropriate care team members and the patient's provider was performed as part of comprehensive patient evaluation and provision of chronic care management services.    Advance Directive Status: NA SDOH (Social Determinants of Health) assessments performed: No needs identified    ; Goals Addressed            This Visit's Progress    Community Resources       Follow Up Date 04/22/2020    - follow-up on any referrals for help I am given ( DSS and CAP)  - Mom will complete Guardianship forms and call DSS with questions - Mom will re-fax physician level of care form to CAP program  Why is this important?   Knowing how and where to find help for yourself or family in your neighborhood and community is an important skill.    Notes: Interventions provided by LCSW:   Assessment of needs,  barriers , agencies contacted, as well as how impacting     Provided information about Guardianship; emailed forms and contact person at [employer] with [facility] CAP regarding update on CAP application and is missing  Solution-Focused Strategies;  and Task centered         Outpatient Encounter Medications as of 04/07/2020  Medication Sig   halobetasol (ULTRAVATE) 0.05 % cream APPLY TO AFFECTED AREA TWICE A DAY   carbamazepine (CARBATROL) 200 MG 12 hr capsule Take 3 capsules (600 mg total) by mouth 2 (two) times daily.   diphenhydrAMINE HCl, Sleep, (ZZZQUIL PO) Take 30 mLs by mouth at bedtime.   levETIRAcetam (KEPPRA) 100 MG/ML solution Take 15 mLs (1,500 mg total) by mouth 2 (two) times daily.   VIMPAT 200 MG TABS tablet TAKE 1 TABLET BY MOUTH TWICE A DAY   No facility-administered encounter medications on file as of 04/07/2020.       Information about Care Management services was shared with Mr.  Lunden today including:  1. Care Management services include personalized support from designated clinical staff supervised by his physician, including individualized plan of care and coordination with other care providers 2. Remind patient of 24/7 contact phone numbers to provider's office for assistance with urgent and routine care needs. 3. Care Management services are voluntary and patient may stop at any time .  Patient's mother agreed to services provided today and verbal  consent obtained.    Sammuel Hines, LCSW Care Management & Coordination  [facility] / [facility]   [phone] 2:21 PM

## 2020-04-14 ENCOUNTER — Ambulatory Visit: Payer: Medicaid Other | Admitting: Licensed Clinical Social Worker

## 2020-04-14 DIAGNOSIS — Z741 Need for assistance with personal care: Secondary | ICD-10-CM

## 2020-04-14 NOTE — Chronic Care Management (AMB) (Signed)
  Care Management   Clinical Social Work Follow Up   04/14/2020 Name: Billy Ayala MRN: [id number] DOB: 2001-04-07 Referred by: Shirlean Mylar, MD  Reason for referral : Care Coordination  Billy Ayala is a 19 y.o. year old male who is a primary care patient of Shirlean Mylar, MD.  Reason for follow-up: assess for barriers and progress with CAP program and community support .    Assessment: Patient's mom received letter in the mail that CAP services were denied due to PCP selecting patient did not need support in the home.  Mom states this was an error however application is denied.   Recommendation: Patient may benefit from, and is in agreement to contact CAP supervisor in [facility] for options. Return call from patient's mother, she spoke with CAP worker who states PCP needs to correct the form, initial it and write brief statement on letter head stating the error.  Interventions: . Provided patient with information about CAP contact name and number . Advised patient to call CAP office . Collaborated with primary care provider re: what patient's mother needs to move forward with CAP* . Collaborated with CAP office for options and contact person . Solution-Focused Strategies; Emotional/Supportive Counseling; Problem Solving  Review of patient status, including review of consultants reports, relevant laboratory and other test results, and collaboration with appropriate care team members and the patient's provider was performed as part of comprehensive patient evaluation and provision of care management services.   Plan: Patient's mother will bring form to office tomorrow. LCSW will collaborate with PCP for needed information   SDOH (Social Determinants of Health) assessments performed: No needs identified   Goals Addressed            This Visit's Progress   . Community Resources   Not on track    Follow Up Date 04/22/2020    - follow-up on any referrals for help I  am given   - Mom will complete Guardianship forms and call DSS with questions - Mom will refax physician level of care form to CAP program  Why is this important?   Knowing how and where to find help for yourself or family in your neighborhood and community is an important skill.    Notes: Interventions provided by LCSW:  . Assessment of needs, barriers , agencies contacted, as well as how impacting    . Provided information about Guardianship; emailed forms and contact person at DSS . Collaborated with [facility] CAP regarding update on CAP application . Solution-Focused Strategies;  and Task centered         Outpatient Encounter Medications as of 04/14/2020  Medication Sig  . halobetasol (ULTRAVATE) 0.05 % cream APPLY TO AFFECTED AREA TWICE A DAY  . carbamazepine (CARBATROL) 200 MG 12 hr capsule Take 3 capsules (600 mg total) by mouth 2 (two) times daily.  . diphenhydrAMINE HCl, Sleep, (ZZZQUIL PO) Take 30 mLs by mouth at bedtime.  . levETIRAcetam (KEPPRA) 100 MG/ML solution Take 15 mLs (1,500 mg total) by mouth 2 (two) times daily.  Marland Kitchen VIMPAT 200 MG TABS tablet TAKE 1 TABLET BY MOUTH TWICE A DAY   No facility-administered encounter medications on file as of 04/14/2020.

## 2020-04-15 ENCOUNTER — Encounter: Payer: Self-pay | Admitting: Family Medicine

## 2020-04-15 ENCOUNTER — Ambulatory Visit: Payer: Medicaid Other | Admitting: Licensed Clinical Social Worker

## 2020-04-15 DIAGNOSIS — Z7189 Other specified counseling: Secondary | ICD-10-CM

## 2020-04-15 NOTE — Chronic Care Management (AMB) (Signed)
  Care Management   Clinical Social Work Follow Up   04/15/2020 Name: Billy Ayala MRN: [id number] DOB: April 30, 2001 Referred by: [name] Damme, MD  Reason for referral : Care Coordination  Billy Ayala is a 19 y.o. year old male who is a primary care patient of [name] Damme, MD.  Reason for follow-up: assess for barriers and progress with forms needed for CAP program .   Met with patient's mother to assist with obtaining updated and revised forms  from PCP for CAP program.  Review of patient status, including review of consultants reports, relevant laboratory and other test results, and collaboration with appropriate care team members and the patient's provider was performed as part of comprehensive patient evaluation and provision of care management services.   Plan: Patient's mother would like continued follow-up. LCSW will f/u in 2 weeks.  Advance Directive Status: NA  SDOH (Social Determinants of Health) assessments performed: ; No needs identified   Goals Addressed            This Visit's Progress   . Care giver support       Follow Up Date 04/28/2020    - follow-up on any referrals for help I am given   - Mom will complete Guardianship forms and call DSS with questions - Mom will re-fax physician level of care form to CAP program  Why is this important?   Knowing how and where to find help for yourself or family in your neighborhood and community is an important skill.    Notes: Interventions provided by LCSW:  . Assessment of needs, barriers , agencies contacted, as well as how impacting    . Provided information about Guardianship; emailed forms and contact person at [place] . Collaborated with PCP to update CAP form as well as write letter . Provided information to patient's mother . Solution-Focused Strategies;  and Task centered         Outpatient Encounter Medications as of 04/15/2020  Medication Sig  . halobetasol (ULTRAVATE) 0.05 % cream APPLY TO  AFFECTED AREA TWICE A DAY  . carbamazepine (CARBATROL) 200 MG 12 hr capsule Take 3 capsules (600 mg total) by mouth 2 (two) times daily.  . diphenhydrAMINE HCl, Sleep, (ZZZQUIL PO) Take 30 mLs by mouth at bedtime.  . levETIRAcetam (KEPPRA) 100 MG/ML solution Take 15 mLs (1,500 mg total) by mouth 2 (two) times daily.  Marland Kitchen VIMPAT 200 MG TABS tablet TAKE 1 TABLET BY MOUTH TWICE A DAY   No facility-administered encounter medications on file as of 04/15/2020.   Casimer Lanius, [place] / [place]   [phone] 2:00 PM

## 2020-04-21 ENCOUNTER — Other Ambulatory Visit: Payer: Self-pay | Admitting: Diagnostic Neuroimaging

## 2020-04-22 ENCOUNTER — Telehealth: Payer: Medicaid Other

## 2020-04-27 ENCOUNTER — Encounter: Payer: Self-pay | Admitting: Diagnostic Neuroimaging

## 2020-04-27 ENCOUNTER — Ambulatory Visit (INDEPENDENT_AMBULATORY_CARE_PROVIDER_SITE_OTHER): Payer: Medicaid Other | Admitting: Diagnostic Neuroimaging

## 2020-04-27 DIAGNOSIS — G40909 Epilepsy, unspecified, not intractable, without status epilepticus: Secondary | ICD-10-CM

## 2020-04-27 MED ORDER — LEVETIRACETAM 100 MG/ML PO SOLN: 1500.0000 mg | Freq: Two times a day (BID) | ORAL | 4 refills | Status: DC

## 2020-04-27 MED ORDER — LACOSAMIDE 200 MG PO TABS: 200.0000 mg | ORAL_TABLET | Freq: Two times a day (BID) | ORAL | 4 refills | Status: DC

## 2020-04-27 MED ORDER — CARBAMAZEPINE ER 200 MG PO CP12: 600.0000 mg | ORAL_CAPSULE | Freq: Two times a day (BID) | ORAL | 4 refills | Status: DC

## 2020-04-27 NOTE — Patient Instructions (Signed)
-   continue levetiracetam 1500mg  twice a day + vimpat 200mg  twice a day + carbamazepine 600mg  twice a day

## 2020-04-27 NOTE — Progress Notes (Signed)
[facility]  PATIENT: Billy Ayala DOB: 2000/09/05  REFERRING CLINICIAN: A Riccio, DO HISTORY FROM: mother, chart review, patient REASON FOR VISIT: follow up   HISTORICAL  CHIEF COMPLAINT:  Chief Complaint  Patient presents with  . Seizure disorder    rm 4, one year FU, mom- Billy Ayala "no seizures per mom"    HISTORY OF PRESENT ILLNESS:   UPDATE (04/27/20, VRP): Since last visit, doing well. Symptoms are stable. Severity is mild. No alleviating or aggravating factors. Tolerating meds. Last sz in Nov 2020.  UPDATE (04/22/19, VRP): Since last visit, doing well. Symptoms are stable. No seizures. No alleviating or aggravating factors. Tolerating meds. Some crying spells intermittently.   UPDATE (09/03/18, VRP): Since last visit, had mild sz (10-15 seconds) on 07/24/18. Went to ER and was stable. Since then, now on higher CBZ dosing and doing well. No more events. Intermittent mild stuttering. No other side effects.   UPDATE (05/14/18, VRP): Since last visit, had another seizure and admission in oct 2019. Had aspiration, resp failure, hemoptysis, and ICU support. Now on vimpat + levetiracetam. Some decr appetite and energy. No more seizures.    UPDATE (03/19/18, VRP): Since last visit, had 2 more seizures. LEV has been increased. Last sz no 03/11/18. No alleviating or aggravating factors. Tolerating medication.   PRIOR HPI (02/27/18): 19 year old male here for evaluation of seizures.  History of autism.  June 2019 patient was at home, collapsed and had generalized convulsions.  Patient was taken to the emergency room and then began to cough and vomit blood.  Patient was also diagnosed with diffuse alveolar hemorrhage.  He was started on antiseizure medication.  No specific etiology for hemoptysis was found.  July 2019 patient returned to the hospital for another seizure.  Again patient began to have pulmonary edema and hemoptysis.  Patient was immediately started on  steroids and antiseizure medication dose was increased.  Since that time patient has been doing well.  No further seizures or hemoptysis.  He has been following up with PCP and pulmonary clinic.  He has additional pulmonary testing scheduled for October 2019.  No prior history of seizures.  There is family history of seizure in patient's father and paternal aunt.  No side effects of antiseizure medication at this time.   REVIEW OF SYSTEMS: Full 14 system review of systems performed and negative with exception of: as per HPI.   ALLERGIES: No Known Allergies  HOME MEDICATIONS: Outpatient Medications Prior to Visit  Medication Sig Dispense Refill  . carbamazepine (CARBATROL) 200 MG 12 hr capsule Take 3 capsules (600 mg total) by mouth 2 (two) times daily. 540 capsule 4  . diphenhydrAMINE HCl, Sleep, (ZZZQUIL PO) Take 30 mLs by mouth at bedtime.    . halobetasol (ULTRAVATE) 0.05 % cream APPLY TO AFFECTED AREA TWICE A DAY 50 g 0  . levETIRAcetam (KEPPRA) 100 MG/ML solution TAKE 15 MLS (1,500 MG TOTAL) BY MOUTH 2 (TWO) TIMES DAILY. 900 mL 12  . VIMPAT 200 MG TABS tablet TAKE 1 TABLET BY MOUTH TWICE A DAY 60 tablet 5   No facility-administered medications prior to visit.    PAST MEDICAL HISTORY: Past Medical History:  Diagnosis Date  . Autism   . Seizure (HCC)    sz 07/24/2018  . Sickle cell trait (HCC)     PAST SURGICAL HISTORY: No past surgical history on file.  FAMILY HISTORY: Family History  Problem Relation Age of Onset  . Asthma Mother   . Hypertension  Father   . Sickle cell trait Father   . Bipolar disorder Father   . Asthma Sister   . Hypertension Maternal Grandmother   . Early death Neg Hx     SOCIAL HISTORY: Social History   Socioeconomic History  . Marital status: Single    Spouse name: Not on file  . Number of children: Not on file  . Years of education: Not on file  . Highest education level: Not on file  Occupational History  . Not on file  Tobacco  Use  . Smoking status: Never Smoker  . Smokeless tobacco: Never Used  Vaping Use  . Vaping Use: Never used  Substance and Sexual Activity  . Alcohol use: Never  . Drug use: Never  . Sexual activity: Not on file  Other Topics Concern  . Not on file  Social History Narrative   Lives with mom, three half brothers and boyfriend      Goes to [employer] full school days (6th grader)   Right handed    No caffeine    Social Determinants of Health   Financial Resource Strain:   . Difficulty of Paying Living Expenses: Not on file  Food Insecurity:   . Worried About Programme researcher, broadcasting/film/video in the Last Year: Not on file  . Ran Out of Food in the Last Year: Not on file  Transportation Needs:   . Lack of Transportation (Medical): Not on file  . Lack of Transportation (Non-Medical): Not on file  Physical Activity:   . Days of Exercise per Week: Not on file  . Minutes of Exercise per Session: Not on file  Stress:   . Feeling of Stress : Not on file  Social Connections:   . Frequency of Communication with Friends and Family: Not on file  . Frequency of Social Gatherings with Friends and Family: Not on file  . Attends Religious Services: Not on file  . Active Member of Clubs or Organizations: Not on file  . Attends Banker Meetings: Not on file  . Marital Status: Not on file  Intimate Partner Violence:   . Fear of Current or Ex-Partner: Not on file  . Emotionally Abused: Not on file  . Physically Abused: Not on file  . Sexually Abused: Not on file     PHYSICAL EXAM  GENERAL EXAM/CONSTITUTIONAL: Vitals:  Vitals:   04/27/20 1536  BP: (!) 151/101  Pulse: 88  Weight: 259 lb 3.2 oz (117.6 kg)  Height: 6' (1.829 m)   Body mass index is 35.15 kg/m. Wt Readings from Last 3 Encounters:  04/27/20 259 lb 3.2 oz (117.6 kg)  03/31/20 257 lb 3.2 oz (116.7 kg)  02/19/20 258 lb (117 kg)    Patient is in no distress; well developed, nourished and groomed; neck is  supple  CARDIOVASCULAR:  Examination of carotid arteries is normal; no carotid bruits  Regular rate and rhythm, no murmurs  Examination of peripheral vascular system by observation and palpation is normal  EYES:  Ophthalmoscopic exam of optic discs and posterior segments is normal; no papilledema or hemorrhages No exam data present  MUSCULOSKELETAL:  Gait, strength, tone, movements noted in Neurologic exam below  NEUROLOGIC: MENTAL STATUS:  No flowsheet data found.  awake, alert, oriented to person  [facility] memory   normal attention and concentration  DECR FLUENCY; FOLLOW SIMPLE COMMANDS  fund of knowledge appropriate  CRANIAL NERVE:   2nd - no papilledema on fundoscopic exam  2nd, 3rd, 4th, 6th -  pupils equal and reactive to light, visual fields full to confrontation, extraocular muscles intact, no nystagmus  5th - facial sensation symmetric  7th - facial strength symmetric  8th - hearing intact  9th - palate elevates symmetrically, uvula midline  11th - shoulder shrug symmetric  12th - tongue protrusion midline  MOTOR:   normal bulk and tone, full strength in the BUE, BLE  SENSORY:   normal and symmetric to light touch  COORDINATION:   finger-nose-finger, fine finger movements normal  REFLEXES:   deep tendon reflexes present and symmetric  GAIT/STATION:   narrow based gait     DIAGNOSTIC DATA (LABS, IMAGING, TESTING) - I reviewed patient records, labs, notes, testing and imaging myself where available.  Lab Results  Component Value Date   WBC 9.2 05/03/2018   HGB 14.9 05/03/2018   HCT 48.2 05/03/2018   MCV 75.2 (L) 05/03/2018   PLT 416 (H) 05/03/2018      Component Value Date/Time   NA 141 05/03/2018 0955   K 4.3 05/03/2018 0955   CL 101 05/03/2018 0955   CO2 30 05/03/2018 0955   GLUCOSE 125 (H) 05/03/2018 0955   BUN 15 05/03/2018 0955   CREATININE 0.86 05/03/2018 0955   CALCIUM 9.7 05/03/2018 0955   PROT 7.3 04/27/2018  2058   ALBUMIN 3.9 04/27/2018 2058   AST 32 04/27/2018 2058   ALT 25 04/27/2018 2058   ALKPHOS 103 04/27/2018 2058   BILITOT 0.5 04/27/2018 2058   GFRNONAA >60 05/03/2018 0955   GFRAA >60 05/03/2018 0955   Lab Results  Component Value Date   TRIG 142 04/30/2018   No results found for: HGBA1C Lab Results  Component Value Date   VITAMINB12 248 01/15/2018   Lab Results  Component Value Date   TSH 0.422 12/14/2017    12/13/17 EEG - This is a normal sleep/sedated EEG for the patients stated age.  There were no focal, hemispheric or lateralizing features.  No epileptiform activity was recorded.  Comment cannot be made on waking rhythm as the patient was sedated with Versed and Precedex.  A normal EEG does not exclude the diagnosis of a seizure disorder and if seizure remains high on the list of differential diagnosis, a repeat EEG when off of sedation may be of value.  Correlate clinically.  12/13/17 MRI brain [I reviewed images myself and agree with interpretation. -VRP]  - Unremarkable appearance of the brain.    ASSESSMENT AND PLAN  19 y.o. year old male here with autism and seizure disorder.    Initial 2 seizures may have triggered pulmonary edema and hemoptysis. Other possibility is underlying pulmonary disease (autoimmune) leading to hypoxia and seizure secondarily.  I would favor that patient had seizure as the initial event for both admissions; this is based on history per family.   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 Seizure disorder (HCC)      PLAN:  - continue levetiracetam 1500mg  twice a day + vimpat 200mg  twice a day + carbamazepine 600mg  twice a day   - in future may consider adding lamotrigine, topiramate, depakote, onfi or phenobarbital  - Please maintain precautions. Do not participate in activities where a loss of awareness could harm you or someone else. No swimming alone, no tub bathing, no hot tubs, no driving, no operating  motorized vehicles (cars, ATVs, motocycles, etc), lawnmowers, power tools or firearms. No standing at heights, such as rooftops, ladders or stairs. Avoid hot objects such as stoves, heaters,  open fires. Wear a helmet when riding a bicycle, scooter, skateboard, etc. and avoid areas of traffic. Set your water heater to 120 degrees or less.  Meds ordered this encounter  Medications  . carbamazepine (CARBATROL) 200 MG 12 hr capsule    Sig: Take 3 capsules (600 mg total) by mouth 2 (two) times daily.    Dispense:  540 capsule    Refill:  4  . levETIRAcetam (KEPPRA) 100 MG/ML solution    Sig: Take 15 mLs (1,500 mg total) by mouth 2 (two) times daily.    Dispense:  2700 mL    Refill:  4  . lacosamide (VIMPAT) 200 MG TABS tablet    Sig: Take 1 tablet (200 mg total) by mouth 2 (two) times daily.    Dispense:  180 tablet    Refill:  4    This request is for a new prescription for a controlled substance as required by Federal/State law.   Return in about 1 year (around 04/27/2021) for with NP (Amy Lomax).    Suanne Marker, MD 04/27/2020, 4:01 PM Certified in Neurology, Neurophysiology and Neuroimaging  [facility] [address] [place], [place] [address] [phone]

## 2020-04-28 ENCOUNTER — Ambulatory Visit: Payer: Medicaid Other | Admitting: Licensed Clinical Social Worker

## 2020-04-28 DIAGNOSIS — Z7189 Other specified counseling: Secondary | ICD-10-CM

## 2020-04-28 DIAGNOSIS — Z719 Counseling, unspecified: Secondary | ICD-10-CM

## 2020-04-28 NOTE — Chronic Care Management (AMB) (Signed)
  Care Management   Clinical Social Work Follow Up   04/28/2020 Name: Billy Ayala MRN: [id number] DOB: 07/03/2001 Referred by: Shirlean Mylar, MD  Reason for referral : Care Coordination  Billy Ayala is a 19 y.o. year old male who is a primary care patient of Shirlean Mylar, MD.  Reason for follow-up: assess for barriers and progress with caregiver support for CAP .    Assessment: Patient 's mother continues to experience difficulty with getting CAP application resubmitted.  She has faxed it twice and they continue to report not received. Mom has completed guardianship forms and will consult with DSS if she has questions.  Current Barriers:  Coordinating with CAP intake  Plan: Patient's mother does not desire continued follow-up. LCSW will discontinue outreach. Mom will call office as needed.  Interventions provided by LCSW:   Assessment of needs, as well as how impacting, barriers , progress, agencies contacted and outcome     . Solution-Focused Strategies . Problem Solving  . Teaching/Coaching Strategies      SDOH (Social Determinants of Health) assessments performed: No new needs identified   Goals Addressed            This Visit's Progress   . Care giver support   On track       - Mom will complete Guardianship forms and call DSS with questions - Mom will re-fax physician level of care form to CAP program and continue to follow up      Outpatient Encounter Medications as of 04/28/2020  Medication Sig  . carbamazepine (CARBATROL) 200 MG 12 hr capsule Take 3 capsules (600 mg total) by mouth 2 (two) times daily.  . diphenhydrAMINE HCl, Sleep, (ZZZQUIL PO) Take 30 mLs by mouth at bedtime.  . halobetasol (ULTRAVATE) 0.05 % cream APPLY TO AFFECTED AREA TWICE A DAY  . lacosamide (VIMPAT) 200 MG TABS tablet Take 1 tablet (200 mg total) by mouth 2 (two) times daily.  Marland Kitchen levETIRAcetam (KEPPRA) 100 MG/ML solution Take 15 mLs (1,500 mg total) by mouth 2 (two) times  daily.   No facility-administered encounter medications on file as of 04/28/2020.   Review of patient status, including review of consultants reports, relevant laboratory and other test results, and collaboration with appropriate care team members and the patient's provider was performed as part of comprehensive patient evaluation and provision of care management services.   Sammuel Hines, LCSW Care Management & Coordination  [facility]   [phone] 2:01 PM

## 2020-04-28 NOTE — Patient Instructions (Signed)
°  Mr. Brisendine  it was nice speaking with you. Please call me directly [phone] if you have questions about the goals we discussed. Goals Addressed            This Visit's Progress    Care giver support   On track       - Mom will complete Guardianship forms and call DSS with questions - Mom will re-fax physician level of care form to CAP program and continue to follow up      Mr. Ladona Ridgel received Care Management services today:  1. Care Management services include personalized support from designated clinical staff supervised by his physician, including individualized plan of care and coordination with other care providers 2. 24/7 contact [phone] for assistance for urgent and routine care needs. 3. Care Management are voluntary services and be declined at any time by calling the office.  Patient verbalizes understanding of instructions provided today.  Follow up plan: mom will contact office as needed  Soundra Pilon, LCSW

## 2020-07-02 ENCOUNTER — Other Ambulatory Visit: Payer: Self-pay | Admitting: Family Medicine

## 2020-07-02 DIAGNOSIS — L2082 Flexural eczema: Secondary | ICD-10-CM

## 2020-11-05 ENCOUNTER — Other Ambulatory Visit: Payer: Self-pay | Admitting: Diagnostic Neuroimaging

## 2020-11-05 DIAGNOSIS — G40909 Epilepsy, unspecified, not intractable, without status epilepticus: Secondary | ICD-10-CM

## 2020-11-09 NOTE — Telephone Encounter (Signed)
Called mother and confirmed pharmacy is [facility], [address] [place]. Will advise MD re: Vimpat refill.

## 2021-04-28 ENCOUNTER — Telehealth: Payer: Self-pay

## 2021-04-28 ENCOUNTER — Ambulatory Visit: Payer: Medicaid Other | Admitting: Family Medicine

## 2021-04-28 NOTE — Telephone Encounter (Signed)
Completed letter for jury duty. Sent via [facility] admin to be mailed. IF mother needs copy sooner, she can pick up hard copy in office.  Shirlean Mylar, MD [facility], PGY-3

## 2021-04-28 NOTE — Telephone Encounter (Signed)
Patients mother calls nurse line reporting he received a jury summons for 11/10. Mother is requesting an excusal letter from PCP. Please advise.   Juror # [id number]

## 2021-05-25 ENCOUNTER — Other Ambulatory Visit: Payer: Self-pay

## 2021-05-25 ENCOUNTER — Ambulatory Visit (INDEPENDENT_AMBULATORY_CARE_PROVIDER_SITE_OTHER): Payer: Medicaid Other

## 2021-05-25 DIAGNOSIS — Z23 Encounter for immunization: Secondary | ICD-10-CM

## 2021-05-25 NOTE — Progress Notes (Signed)
Patient presents to nurse clinic for flu vaccination. Administered in RD, site unremarkable, tolerated injection well.   Krystyn Picking C Keighan Amezcua, RN  

## 2021-06-03 ENCOUNTER — Other Ambulatory Visit: Payer: Self-pay | Admitting: Diagnostic Neuroimaging

## 2021-06-03 DIAGNOSIS — G40909 Epilepsy, unspecified, not intractable, without status epilepticus: Secondary | ICD-10-CM

## 2021-07-12 ENCOUNTER — Ambulatory Visit (INDEPENDENT_AMBULATORY_CARE_PROVIDER_SITE_OTHER): Payer: Medicaid Other

## 2021-07-12 ENCOUNTER — Other Ambulatory Visit: Payer: Self-pay

## 2021-07-12 ENCOUNTER — Encounter: Payer: Self-pay | Admitting: Family Medicine

## 2021-07-12 ENCOUNTER — Ambulatory Visit (INDEPENDENT_AMBULATORY_CARE_PROVIDER_SITE_OTHER): Payer: Medicaid Other | Admitting: Family Medicine

## 2021-07-12 DIAGNOSIS — L2082 Flexural eczema: Secondary | ICD-10-CM

## 2021-07-12 DIAGNOSIS — Z0000 Encounter for general adult medical examination without abnormal findings: Secondary | ICD-10-CM | POA: Diagnosis not present

## 2021-07-12 DIAGNOSIS — Z23 Encounter for immunization: Secondary | ICD-10-CM

## 2021-07-12 NOTE — Patient Instructions (Signed)
It was a pleasure to see you today!  After a COVID vaccine, it is normal to have pain at the site of injection, redness, swelling, soreness, and a small fever. Use tylenol and/or an icepack. For eczema: start with using vaseline  on his neck every day for two weeks. If this does not improve by his next appt, let me know and we can use a stronger steroid. I recommend not using alcohol as this is drying.   Be Well,  Dr. Leary Roca

## 2021-07-12 NOTE — Assessment & Plan Note (Signed)
Recommend ceasing alcohol on skin as this disrupts moisture barrier. Recommend trying vaseline for the next two weeks as well as tepid baths with limited soap on areas affected (elbows, neck). If no improvement, at next vaccine will try stronger steroid.

## 2021-07-12 NOTE — Assessment & Plan Note (Signed)
Counseled on after care including expected reactions, return precautions.

## 2021-07-12 NOTE — Progress Notes (Signed)
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   Eczema: lichenification of posterior neck and elbows. Mom reports using triamcinolone 0.1% cr once a day for 2 weeks and then takes a two week break. Mom has noticed that this has gotten worse in the last month. During the two week steroid break, they have used alcohol on the skin for cleansing.   PERTINENT  PMH / PSH: ASD, eczema  OBJECTIVE:   BP (!) 130/104    Pulse 76    Ht 6' (1.829 m)    Wt 250 lb 3.2 oz (113.5 kg)    SpO2 100%    BMI 33.93 kg/m   Nursing note and vitals reviewed GEN: young AAM, resting comfortably in chair, NAD, class II obesity Cardiac: Regular rate and rhythm. Normal S1/S2. No murmurs, rubs, or gallops appreciated. 2+ radial pulses. Skin: lichenification of posterior neck Ext: no edema Psych: Pleasant and appropriate   ASSESSMENT/PLAN:   Eczema Recommend ceasing alcohol on skin as this disrupts moisture barrier. Recommend trying vaseline for the next two weeks as well as tepid baths with limited soap on areas affected (elbows, neck). If no improvement, at next vaccine will try stronger steroid.  Healthcare maintenance Counseled on after care including expected reactions, return precautions.     Shirlean Mylar, MD [facility]

## 2021-07-18 ENCOUNTER — Other Ambulatory Visit: Payer: Self-pay | Admitting: Diagnostic Neuroimaging

## 2021-08-02 ENCOUNTER — Ambulatory Visit (INDEPENDENT_AMBULATORY_CARE_PROVIDER_SITE_OTHER): Payer: Medicaid Other

## 2021-08-02 ENCOUNTER — Other Ambulatory Visit: Payer: Self-pay

## 2021-08-02 DIAGNOSIS — Z23 Encounter for immunization: Secondary | ICD-10-CM | POA: Diagnosis present

## 2021-09-06 ENCOUNTER — Encounter: Payer: Self-pay | Admitting: Family Medicine

## 2021-09-06 ENCOUNTER — Other Ambulatory Visit: Payer: Self-pay

## 2021-09-06 ENCOUNTER — Ambulatory Visit (INDEPENDENT_AMBULATORY_CARE_PROVIDER_SITE_OTHER): Payer: Medicaid Other | Admitting: Family Medicine

## 2021-09-06 DIAGNOSIS — G40909 Epilepsy, unspecified, not intractable, without status epilepticus: Secondary | ICD-10-CM | POA: Diagnosis not present

## 2021-09-06 MED ORDER — LEVETIRACETAM 100 MG/ML PO SOLN: 1500.0000 mg | Freq: Two times a day (BID) | ORAL | 3 refills | Status: DC

## 2021-09-06 MED ORDER — CARBAMAZEPINE ER 200 MG PO CP12: 600.0000 mg | ORAL_CAPSULE | Freq: Two times a day (BID) | ORAL | 3 refills | Status: DC

## 2021-09-06 MED ORDER — LACOSAMIDE 200 MG PO TABS: 200.0000 mg | ORAL_TABLET | Freq: Two times a day (BID) | ORAL | 1 refills | Status: DC

## 2021-09-06 NOTE — Addendum Note (Signed)
Addended by: Shawnie Dapper L on: 09/06/2021 10:49 AM ? ? Modules accepted: Orders, Level of Service ? ?

## 2021-09-06 NOTE — Progress Notes (Addendum)
Chief Complaint  Patient presents with   Follow-up    Rm 42, w mother. Here for yearly SZ f/u. Pt reports doing well. No sz like activity since last ov. Needing refills today.      HISTORY OF PRESENT ILLNESS:  09/06/21 ALL:  Billy Ayala is a 21 y.o. male here today for follow up for seizure disorder. He continues levetiracetam 1500mg , carbamazepine 600mg  and lacosamide 200mg  BID. He is doing well. No seizure activity.   Mom reports BP is always elevated at MD office. Readings always recover once home. She denies any obvious symptoms of elevated BP at home. He is sleeping well. He has a good appetite. He does not drive. He has not had recent labs. He is fearful of needles. She requests to update labs with upcoming PCP visit for Covid booster.    HISTORY (copied from Dr Gladstone Lighter previous note)  UPDATE (04/27/20, VRP): Since last visit, doing well. Symptoms are stable. Severity is mild. No alleviating or aggravating factors. Tolerating meds. Last sz in Nov 2020.   UPDATE (04/22/19, VRP): Since last visit, doing well. Symptoms are stable. No seizures. No alleviating or aggravating factors. Tolerating meds. Some crying spells intermittently.    UPDATE (09/03/18, VRP): Since last visit, had mild sz (10-15 seconds) on 07/24/18. Went to ER and was stable. Since then, now on higher CBZ dosing and doing well. No more events. Intermittent mild stuttering. No other side effects.    UPDATE (05/14/18, VRP): Since last visit, had another seizure and admission in oct 2019. Had aspiration, resp failure, hemoptysis, and ICU support. Now on vimpat + levetiracetam. Some decr appetite and energy. No more seizures.     UPDATE (03/19/18, VRP): Since last visit, had 2 more seizures. LEV has been increased. Last sz no 03/11/18. No alleviating or aggravating factors. Tolerating medication.    PRIOR HPI (02/27/18): 21 year old male here for evaluation of seizures.  History of autism.   June 2019 patient was  at home, collapsed and had generalized convulsions.  Patient was taken to the emergency room and then began to cough and vomit blood.  Patient was also diagnosed with diffuse alveolar hemorrhage.  He was started on antiseizure medication.  No specific etiology for hemoptysis was found.   July 2019 patient returned to the hospital for another seizure.  Again patient began to have pulmonary edema and hemoptysis.  Patient was immediately started on steroids and antiseizure medication dose was increased.   Since that time patient has been doing well.  No further seizures or hemoptysis.  He has been following up with PCP and pulmonary clinic.  He has additional pulmonary testing scheduled for October 2019.   No prior history of seizures.  There is family history of seizure in patient's father and paternal aunt.  No side effects of antiseizure medication at this time.   REVIEW OF SYSTEMS: Out of a complete 14 system review of symptoms, the patient complains only of the following symptoms, cognitive delay, congenital and all other reviewed systems are negative.   ALLERGIES: No Known Allergies   HOME MEDICATIONS: Outpatient Medications Prior to Visit  Medication Sig Dispense Refill   diphenhydrAMINE HCl, Sleep, (ZZZQUIL PO) Take 30 mLs by mouth at bedtime.     halobetasol (ULTRAVATE) 0.05 % cream APPLY TO AFFECTED AREA TWICE A DAY 50 g 0   carbamazepine (CARBATROL) 200 MG 12 hr capsule TAKE 3 CAPSULES (600 MG TOTAL) BY MOUTH 2 (TWO) TIMES DAILY. 540 capsule 0  lacosamide (VIMPAT) 200 MG TABS tablet TAKE 1 TABLET BY MOUTH TWICE A DAY 60 tablet 3   levETIRAcetam (KEPPRA) 100 MG/ML solution TAKE 15 MLS (1,500 MG TOTAL) BY MOUTH 2 (TWO) TIMES DAILY. 2700 mL 0   No facility-administered medications prior to visit.     PAST MEDICAL HISTORY: Past Medical History:  Diagnosis Date   Autism    Seizure ([place])    sz 07/24/2018   Sickle cell trait ([place])      PAST SURGICAL HISTORY: History reviewed.  No pertinent surgical history.   FAMILY HISTORY: Family History  Problem Relation Age of Onset   Asthma Mother    Hypertension Father    Sickle cell trait Father    Bipolar disorder Father    Asthma Sister    Hypertension Maternal Grandmother    Early death Neg Hx      SOCIAL HISTORY: Social History   Socioeconomic History   Marital status: Single    Spouse name: Not on file   Number of children: Not on file   Years of education: Not on file   Highest education level: Not on file  Occupational History   Not on file  Tobacco Use   Smoking status: Never   Smokeless tobacco: Never  Vaping Use   Vaping Use: Never used  Substance and Sexual Activity   Alcohol use: Never   Drug use: Never   Sexual activity: Not on file  Other Topics Concern   Not on file  Social History Narrative   Lives with mom, three half brothers and boyfriend      Goes to [employer] full school days (6th grader)   Right handed    No caffeine    Social Determinants of Radio broadcast assistant Strain: Not on file  Food Insecurity: Not on file  Transportation Needs: Not on file  Physical Activity: Not on file  Stress: Not on file  Social Connections: Not on file  Intimate Partner Violence: Not on file     PHYSICAL EXAM  Vitals:   09/06/21 1007  BP: (!) 159/128  Pulse: (!) 106  Weight: 247 lb 9.6 oz (112.3 kg)  Height: 6' (1.829 m)   Body mass index is 33.58 kg/m.  Generalized: Well developed, in no acute distress  Cardiology: normal rate and rhythm, no murmur auscultated  Respiratory: clear to auscultation bilaterally    Neurological examination  Mentation: Alert, oriented to person, decreased memory, congenital cognitive delays, Follows simple commands speech and language fluent Cranial nerve II-XII: Pupils were equal round reactive to light. Extraocular movements were full, visual field were full on confrontational test. Facial sensation and strength were normal. Head turning  and shoulder shrug  were normal and symmetric. Motor: The motor testing reveals 5 over 5 strength of all 4 extremities. Good symmetric motor tone is noted throughout.   Gait and station: Gait is normal.    DIAGNOSTIC DATA (LABS, IMAGING, TESTING) - I reviewed patient records, labs, notes, testing and imaging myself where available.  Lab Results  Component Value Date   WBC 9.2 05/03/2018   HGB 14.9 05/03/2018   HCT 48.2 05/03/2018   MCV 75.2 (L) 05/03/2018   PLT 416 (H) 05/03/2018      Component Value Date/Time   NA 141 05/03/2018 0955   K 4.3 05/03/2018 0955   CL 101 05/03/2018 0955   CO2 30 05/03/2018 0955   GLUCOSE 125 (H) 05/03/2018 0955   BUN 15 05/03/2018 0955   CREATININE  0.86 05/03/2018 0955   CALCIUM 9.7 05/03/2018 0955   PROT 7.3 04/27/2018 2058   ALBUMIN 3.9 04/27/2018 2058   AST 32 04/27/2018 2058   ALT 25 04/27/2018 2058   ALKPHOS 103 04/27/2018 2058   BILITOT 0.5 04/27/2018 2058   GFRNONAA >60 05/03/2018 0955   GFRAA >60 05/03/2018 0955   Lab Results  Component Value Date   TRIG 142 04/30/2018   No results found for: HGBA1C Lab Results  Component Value Date   VITAMINB12 248 01/15/2018   Lab Results  Component Value Date   TSH 0.422 12/14/2017    No flowsheet data found.   No flowsheet data found.   ASSESSMENT AND PLAN  21 y.o. year old male  has a past medical history of Autism, Seizure ([place]), and Sickle cell trait ([place]). here with    Seizure disorder ([facility]) - Plan: CMP, Carbamazepine level, total, CBC with Differential/Platelets, lacosamide (VIMPAT) 200 MG TABS tablet  Billy Ayala is doing well, today. No recent seizure activity. He will continue levetiracetam 1500mg , lacosamide 200mg  and carbamazepine 600mg  twice daily. We will order labs with upcoming PCP visit for Covid booster per his mother's request. Lab orders placed in Epic. Healthy lifestyle habits encouraged. He will follow up with me in 1 year, sooner if needed. His mother verbalizes  understanding and agreement with this plan.    Orders Placed This Encounter  Procedures   CMP    Standing Status:   Future    Standing Expiration Date:   03/09/2022   Carbamazepine level, total    Standing Status:   Future    Standing Expiration Date:   03/09/2022   CBC with Differential/Platelets    Standing Status:   Future    Standing Expiration Date:   03/09/2022     Meds ordered this encounter  Medications   levETIRAcetam (KEPPRA) 100 MG/ML solution    Sig: Take 15 mLs (1,500 mg total) by mouth 2 (two) times daily.    Dispense:  2700 mL    Refill:  3    Must keep FU    Order Specific Question:   Supervising Provider    Answer:   Melvenia Beam [id number]   carbamazepine (CARBATROL) 200 MG 12 hr capsule    Sig: Take 3 capsules (600 mg total) by mouth 2 (two) times daily.    Dispense:  540 capsule    Refill:  3    Must keep FU    Order Specific Question:   Supervising Provider    Answer:   Melvenia Beam [id number]   lacosamide (VIMPAT) 200 MG TABS tablet    Sig: Take 1 tablet (200 mg total) by mouth 2 (two) times daily.    Dispense:  180 tablet    Refill:  1    This request is for a new prescription for a controlled substance as required by Federal/State law.    Order Specific Question:   Supervising Provider    Answer:   Melvenia Beam [id number]      Debbora Presto, MSN, FNP-C 09/06/2021, 10:49 AM  [facility] [address] [place], [place] [address] [phone]

## 2021-09-06 NOTE — Patient Instructions (Addendum)
Below is our plan: ? ?We will continue levetiracetam 1500mg  twice daily, lacosamide 200mg  twice daily and carbamazepine 600mg  twice daily. I have ordered labs to be drawn with PCP at upcoming visit for Covid booster. Keep an eye on his his blood presure at home.  ? ?Please make sure you are staying well hydrated. I recommend 50-60 ounces daily. Well balanced diet and regular exercise encouraged. Consistent sleep schedule with 6-8 hours recommended.  ? ?Please continue follow up with care team as directed.  ? ?Follow up with me in 1 year  ? ?You may receive a survey regarding today's visit. I encourage you to leave honest feed back as I do use this information to improve patient care. Thank you for seeing me today!  ? ? ?

## 2021-09-30 ENCOUNTER — Ambulatory Visit (INDEPENDENT_AMBULATORY_CARE_PROVIDER_SITE_OTHER): Payer: Medicaid Other

## 2021-09-30 DIAGNOSIS — Z23 Encounter for immunization: Secondary | ICD-10-CM | POA: Diagnosis not present

## 2021-10-02 ENCOUNTER — Telehealth: Payer: Self-pay | Admitting: Neurology

## 2021-10-02 ENCOUNTER — Other Ambulatory Visit: Payer: Self-pay | Admitting: Neurology

## 2021-10-02 DIAGNOSIS — G40909 Epilepsy, unspecified, not intractable, without status epilepticus: Secondary | ICD-10-CM

## 2021-10-02 MED ORDER — LACOSAMIDE 200 MG PO TABS: 200.0000 mg | ORAL_TABLET | Freq: Two times a day (BID) | ORAL | 1 refills | Status: DC

## 2021-10-02 MED ORDER — VIMPAT 200 MG PO TABS: 200.0000 mg | ORAL_TABLET | Freq: Two times a day (BID) | ORAL | 5 refills | Status: DC

## 2021-10-02 MED ORDER — CARBAMAZEPINE ER 200 MG PO CP12: 600.0000 mg | ORAL_CAPSULE | Freq: Two times a day (BID) | ORAL | 3 refills | Status: DC

## 2021-10-02 MED ORDER — LEVETIRACETAM 100 MG/ML PO SOLN: 1500.0000 mg | Freq: Two times a day (BID) | ORAL | 1 refills | Status: DC

## 2021-10-02 NOTE — Telephone Encounter (Signed)
[facility] [address] [facility] VIMPAT Name Brand written , not generic- Nowhere was there a notation that he needs name brand only. And its 5 to noon, his [address] [facility] takes a lunch break !! You would think they knew about brand name before.  ? I am going to scream !  ?

## 2021-10-02 NOTE — Telephone Encounter (Signed)
[facility]. Need to [facility].  ?

## 2021-10-02 NOTE — Telephone Encounter (Signed)
Patient of Dr Marjory Lies. ?Answer-phone call:  ?Patient's mother reports distressed that [facility] [address] did not have any refills for his needed seizure medication on file. ?He was seen just 4 weeks ago and re- visit note stated he will continue all his meds. Medication was reviewed on 09-06-2021 and was reordered by Amy Lomax.  ?The pharmacy is also confused about the "must keep FU notation", which doesn't give a time frame of follow-up, and is undated. Please remove !!! or date.  ?

## 2021-10-02 NOTE — Telephone Encounter (Signed)
I couldn't get an answer if the pharmacy had truly no refills on order ( contradicting Amy's note and refill in epic ) or if they have one or more of the medication not available/ on backorder.  ?I asked The patient's mother to [facility] and make sure the scripts were received and can be filled before making another trip.  ? ?Billy Ayala.D ?

## 2021-10-04 ENCOUNTER — Other Ambulatory Visit: Payer: Self-pay | Admitting: Neurology

## 2021-10-04 MED ORDER — VIMPAT 200 MG PO TABS: 200.0000 mg | ORAL_TABLET | Freq: Two times a day (BID) | ORAL | 5 refills | Status: DC

## 2021-12-03 ENCOUNTER — Telehealth: Payer: Self-pay | Admitting: Family Medicine

## 2021-12-03 NOTE — Telephone Encounter (Signed)
Patient mother called to check if Billy Ayala is due for another Covid booster. Discussed with Janett Billow and April and they suggested I check with the PCP. Please call mother back to schedule if needed.

## 2021-12-03 NOTE — Telephone Encounter (Signed)
I reviewed the recommendations on the CDC website and [place] has had 2 monovalent pfizer vaccines from the primary series and then 2 months later had one bivalent pfizer vaccine. This meets the current recommendations for vaccination. This may change in the future, but as of now, [place] does not require further vaccination. See the quote from [facility] website below.  "A single age-appropriate dose of Moderna COVID-19 Vaccine, Bivalent (Original and Omicron BA.4/BA.5) is recommended for individuals ages 53 years and older who are unvaccinated, or at least 2 months after receipt of any previously administered monovalent COVID-19 vaccine (i.e., Pfizer-BioNTech, Moderna, Novavax, or Janssen COVID-19 monovalent vaccine)."  [name] Damme, MD [facility], PGY-3

## 2022-03-08 ENCOUNTER — Telehealth: Payer: Self-pay | Admitting: *Deleted

## 2022-03-08 NOTE — Telephone Encounter (Signed)
From AL,NP: "Can you guys please check with her mother and see if labs were completed with PCP? We need to update AED level and they requested to do this with PCP but I can't find results in Epic. TY! "  I called and spoke w/ mother. Relayed message. She states PCP did not complete, states they could not see orders. Advised we never received communication for this. She states pt can come Monday 03/14/22 at 830am for labs. He will hold morning dose of carbamazepine until after labs drawn. I placed on lab schedule.

## 2022-03-14 ENCOUNTER — Other Ambulatory Visit: Payer: Medicaid Other

## 2022-03-21 ENCOUNTER — Other Ambulatory Visit: Payer: Medicaid Other

## 2022-03-25 ENCOUNTER — Other Ambulatory Visit: Payer: Self-pay | Admitting: Neurology

## 2022-03-28 ENCOUNTER — Other Ambulatory Visit: Payer: Self-pay | Admitting: Family Medicine

## 2022-03-28 MED ORDER — VIMPAT 200 MG PO TABS: 200.0000 mg | ORAL_TABLET | Freq: Two times a day (BID) | ORAL | 5 refills | Status: DC

## 2022-03-28 MED ORDER — LEVETIRACETAM 100 MG/ML PO SOLN: 1500.0000 mg | Freq: Two times a day (BID) | ORAL | 1 refills | Status: DC

## 2022-03-28 NOTE — Telephone Encounter (Signed)
Pt's mother, Valinda Hoar request refills for carbamazepine (CARBATROL) 200 MG 12 hr capsule and levETIRAcetam (KEPPRA) 100 MG/ML solution and VIMPAT 200 MG TABS tablet at [id number]

## 2022-03-28 NOTE — Telephone Encounter (Signed)
Reviewed pt chart. Last rx carbamazepine sent 10/02/21 #540, 3 refills. Refills should be on file.  E-scribed refill for levetiracetam, pt due for refill for this.  Sent refill for Vimpat to Amy L,NP to e-scribe. Per drug registry, last refilled 01/31/22 #120.  Last seen 09/06/21 and next f/u 09/07/22.

## 2022-04-27 ENCOUNTER — Telehealth: Payer: Self-pay | Admitting: Family Medicine

## 2022-04-27 DIAGNOSIS — G40909 Epilepsy, unspecified, not intractable, without status epilepticus: Secondary | ICD-10-CM

## 2022-04-27 NOTE — Telephone Encounter (Signed)
Pt mother is calling. Stating pt had a break through seizure last night. Said seizure lasted about 1 minute.

## 2022-04-27 NOTE — Telephone Encounter (Signed)
Called the patient's mom. She states that this is the first breakthrough seizure the pt has had. He did not miss any doses of medication. And the event lasted 1 minute. Pt was able to come back oriented shortly after.  Advised that typically if they have just one event we would like to make sure labs look good. Advised his labs didn't appear to have been drawn in early Sept. Informed the patient's mom he should come in to have labs drawn preferably before taking his am dose of the medication. She was advised of the lab hours and will plan on coming in. I did advise Amy NP of this information and she would like for him to have the labs drawn first and then we will decide if changes are needed.

## 2022-05-05 ENCOUNTER — Other Ambulatory Visit (INDEPENDENT_AMBULATORY_CARE_PROVIDER_SITE_OTHER): Payer: Self-pay

## 2022-05-05 ENCOUNTER — Other Ambulatory Visit: Payer: Self-pay | Admitting: Family Medicine

## 2022-05-05 DIAGNOSIS — Z0289 Encounter for other administrative examinations: Secondary | ICD-10-CM

## 2022-05-05 DIAGNOSIS — G40909 Epilepsy, unspecified, not intractable, without status epilepticus: Secondary | ICD-10-CM

## 2022-05-09 ENCOUNTER — Ambulatory Visit: Payer: Medicaid Other

## 2022-05-09 LAB — CBC WITH DIFFERENTIAL/PLATELET
Basophils Absolute: 0 10*3/uL (ref 0.0–0.2)
Basos: 0 %
EOS (ABSOLUTE): 0 10*3/uL (ref 0.0–0.4)
Eos: 1 %
Hematocrit: 50.9 % (ref 37.5–51.0)
Hemoglobin: 15.8 g/dL (ref 13.0–17.7)
Immature Grans (Abs): 0 10*3/uL (ref 0.0–0.1)
Immature Granulocytes: 0 %
Lymphocytes Absolute: 2.5 10*3/uL (ref 0.7–3.1)
Lymphs: 51 %
MCH: 25.5 pg — ABNORMAL LOW (ref 26.6–33.0)
MCHC: 31 g/dL — ABNORMAL LOW (ref 31.5–35.7)
MCV: 82 fL (ref 79–97)
Monocytes Absolute: 0.3 10*3/uL (ref 0.1–0.9)
Monocytes: 6 %
Neutrophils Absolute: 2.1 10*3/uL (ref 1.4–7.0)
Neutrophils: 42 %
Platelets: 409 10*3/uL (ref 150–450)
RBC: 6.2 x10E6/uL — ABNORMAL HIGH (ref 4.14–5.80)
RDW: 14.9 % (ref 11.6–15.4)
WBC: 4.9 10*3/uL (ref 3.4–10.8)

## 2022-05-09 LAB — CARBAMAZEPINE LEVEL, TOTAL: Carbamazepine (Tegretol), S: 9.5 ug/mL (ref 4.0–12.0)

## 2022-05-09 LAB — COMPREHENSIVE METABOLIC PANEL
ALT: 27 IU/L (ref 0–44)
AST: 24 IU/L (ref 0–40)
Albumin/Globulin Ratio: 1.3 (ref 1.2–2.2)
Albumin: 4.5 g/dL (ref 4.3–5.2)
Alkaline Phosphatase: 150 IU/L — ABNORMAL HIGH (ref 44–121)
BUN/Creatinine Ratio: 9 (ref 9–20)
BUN: 10 mg/dL (ref 6–20)
Bilirubin Total: <0.2 mg/dL (ref 0.0–1.2)
CO2: 29 mmol/L (ref 20–29)
Calcium: 9.6 mg/dL (ref 8.7–10.2)
Chloride: 101 mmol/L (ref 96–106)
Creatinine, Ser: 1.07 mg/dL (ref 0.76–1.27)
Globulin, Total: 3.6 g/dL (ref 1.5–4.5)
Glucose: 94 mg/dL (ref 70–99)
Potassium: 4.4 mmol/L (ref 3.5–5.2)
Sodium: 142 mmol/L (ref 134–144)
Total Protein: 8.1 g/dL (ref 6.0–8.5)
eGFR: 100 mL/min/{1.73_m2} (ref >59)

## 2022-05-09 LAB — LEVETIRACETAM LEVEL: Levetiracetam Lvl: 10.4 ug/mL (ref 10.0–40.0)

## 2022-05-09 LAB — LACOSAMIDE: Lacosamide: 3 ug/mL — ABNORMAL LOW (ref 5.0–10.0)

## 2022-05-10 ENCOUNTER — Telehealth: Payer: Self-pay | Admitting: Neurology

## 2022-05-10 NOTE — Telephone Encounter (Signed)
Called the patient's mom on DPR and there was no answer. Left a detailed message advising the mom the lab work was in normal range.advised we will continue current treatment plan. Advised if there are any concerns they should call back

## 2022-05-10 NOTE — Telephone Encounter (Signed)
-----   Message from Debbora Presto, NP sent at 05/10/2022  2:11 PM EST ----- Please let his mother know that his labs are stable. We will continue current treatment plan.

## 2022-07-21 ENCOUNTER — Encounter: Payer: Self-pay | Admitting: Family Medicine

## 2022-07-22 ENCOUNTER — Other Ambulatory Visit: Payer: Self-pay | Admitting: *Deleted

## 2022-07-22 NOTE — Telephone Encounter (Signed)
Pt request 90 days supply for meds we prescribe. Only one he is due for is Vimpat. I have sent to AL,NP to e-scribe to pharmacy. Per drug registry, last refilled 06/28/22 #60. Last seen 09/06/21 and next f/u 09/07/22.

## 2022-07-26 MED ORDER — VIMPAT 200 MG PO TABS: 200.0000 mg | ORAL_TABLET | Freq: Two times a day (BID) | ORAL | 0 refills | Status: DC

## 2022-08-17 ENCOUNTER — Emergency Department (HOSPITAL_COMMUNITY)
Admission: EM | Admit: 2022-08-17 | Discharge: 2022-08-17 | Disposition: A | Discharge status: Home / Self Care | Payer: Medicaid Other | Attending: Emergency Medicine | Admitting: Emergency Medicine

## 2022-08-17 ENCOUNTER — Emergency Department (HOSPITAL_COMMUNITY): Payer: Medicaid Other

## 2022-08-17 DIAGNOSIS — F840 Autistic disorder: Secondary | ICD-10-CM | POA: Insufficient documentation

## 2022-08-17 DIAGNOSIS — R55 Syncope and collapse: Secondary | ICD-10-CM | POA: Diagnosis present

## 2022-08-17 DIAGNOSIS — U071 COVID-19: Secondary | ICD-10-CM | POA: Diagnosis not present

## 2022-08-17 LAB — CBC WITH DIFFERENTIAL/PLATELET
Abs Immature Granulocytes: 0.05 10*3/uL (ref 0.00–0.07)
Basophils Absolute: 0 10*3/uL (ref 0.0–0.1)
Basophils Relative: 0 %
Eosinophils Absolute: 0 10*3/uL (ref 0.0–0.5)
Eosinophils Relative: 0 %
HCT: 45.2 % (ref 39.0–52.0)
Hemoglobin: 14.9 g/dL (ref 13.0–17.0)
Immature Granulocytes: 1 %
Lymphocytes Relative: 9 %
Lymphs Abs: 0.8 10*3/uL (ref 0.7–4.0)
MCH: 25.8 pg — ABNORMAL LOW (ref 26.0–34.0)
MCHC: 33 g/dL (ref 30.0–36.0)
MCV: 78.2 fL — ABNORMAL LOW (ref 80.0–100.0)
Monocytes Absolute: 1.1 10*3/uL — ABNORMAL HIGH (ref 0.1–1.0)
Monocytes Relative: 12 %
Neutro Abs: 7.2 10*3/uL (ref 1.7–7.7)
Neutrophils Relative %: 78 %
Platelets: 321 10*3/uL (ref 150–400)
RBC: 5.78 MIL/uL (ref 4.22–5.81)
RDW: 14.4 % (ref 11.5–15.5)
WBC: 9.2 10*3/uL (ref 4.0–10.5)
nRBC: 0 % (ref 0.0–0.2)

## 2022-08-17 LAB — COMPREHENSIVE METABOLIC PANEL
ALT: 27 U/L (ref 0–44)
AST: 27 U/L (ref 15–41)
Albumin: 3.4 g/dL — ABNORMAL LOW (ref 3.5–5.0)
Alkaline Phosphatase: 99 U/L (ref 38–126)
Anion gap: 12 (ref 5–15)
BUN: 9 mg/dL (ref 6–20)
CO2: 24 mmol/L (ref 22–32)
Calcium: 8.6 mg/dL — ABNORMAL LOW (ref 8.9–10.3)
Chloride: 102 mmol/L (ref 98–111)
Creatinine, Ser: 1.45 mg/dL — ABNORMAL HIGH (ref 0.61–1.24)
GFR, Estimated: >60 mL/min (ref >60)
Glucose, Bld: 120 mg/dL — ABNORMAL HIGH (ref 70–99)
Potassium: 3.6 mmol/L (ref 3.5–5.1)
Sodium: 138 mmol/L (ref 135–145)
Total Bilirubin: 0.4 mg/dL (ref 0.3–1.2)
Total Protein: 7.4 g/dL (ref 6.5–8.1)

## 2022-08-17 LAB — RESP PANEL BY RT-PCR (RSV, FLU A&B, COVID)  RVPGX2
Influenza A by PCR: NEGATIVE
Influenza B by PCR: NEGATIVE
Resp Syncytial Virus by PCR: NEGATIVE
SARS Coronavirus 2 by RT PCR: POSITIVE — AB

## 2022-08-17 LAB — TROPONIN I (HIGH SENSITIVITY)
Troponin I (High Sensitivity): 4 ng/L (ref <18)
Troponin I (High Sensitivity): 4 ng/L (ref <18)

## 2022-08-17 LAB — LIPASE, BLOOD: Lipase: 29 U/L (ref 11–51)

## 2022-08-17 LAB — D-DIMER, QUANTITATIVE: D-Dimer, Quant: 0.29 ug/mL-FEU (ref 0.00–0.50)

## 2022-08-17 LAB — CARBAMAZEPINE LEVEL, TOTAL: Carbamazepine Lvl: 9.7 ug/mL (ref 4.0–12.0)

## 2022-08-17 MED ORDER — LACTATED RINGERS IV BOLUS
1000.0000 mL | Freq: Once | INTRAVENOUS | Status: AC
  Administered 2022-08-17: 1000 mL via INTRAVENOUS

## 2022-08-17 NOTE — ED Triage Notes (Signed)
Pt BIB [facility] from home due to witnessed syncopal episode.  Pt is autistic and mom states he is at baseline.  He can follow commands and say yes or no.  Pt is mostly non-verbal.  Pt was in kitchen pouring cereal and had a syncopal episode.  Brother caught him and slid down to floor.  No head trauma.  [facility] noted no pulses but patient did have carotid on arrival.  12 lead EKG showed flipped T waves.  Pt has hx of seizures.  18g left AC.  535m NS.  VS BP 120/70, CBG 130

## 2022-08-17 NOTE — ED Provider Notes (Signed)
[facility] Provider Note   CSN: [id number] Arrival date & time: 08/17/22  [id number]     History  Chief Complaint  Patient presents with   Loss of Consciousness    Billy Ayala is a 22 y.o. male.  HPI     22 year old male with a history of seizure disorder followed by [facility], on Keppra 1500 mg, carbamazepine 600 mg, and lacosamide 200 mg, autism spectrum disorder with small amount of verbal communication at baseline who presents with concern for syncope.  Mom reports that everyone in the house has had flulike symptoms including her other children and herself.  Today, he was pouring himself some cereal, and began to miss the bolus if he was becoming lightheaded and had a syncopal episode with his father catching him before he fell.  He had another syncopal episode on his way walking to the couch.  Mom reports that he seemed to be out of it with EMS, but is now back to baseline.  Reports his postictal periods are not usually very long.  He is adherent to his medications with his siblings also taking an interest in supporting him.  He took his regular seizure medications today.  He did not appear to have a typical seizure today but has had type of seizure change in the past and mom reports hx of focal and grand mal seizures.  Mom denies any medication changes.   He is back to baseline now. Answering some yes no questions and points to chest when asked about chest pain.  He has had some nasal congestion and cough.  No known fevers. Has not had nausea, vomiting or diarrhea.  After the event she states he seemed out of it initially and that ems equipment at first was not working appropriately.  Fam hx of CAD  Home Medications Prior to Admission medications   Medication Sig Start Date End Date Taking? Authorizing Provider  carbamazepine (CARBATROL) 200 MG 12 hr capsule Take 3 capsules (600 mg total) by mouth 2 (two) times  daily. 10/02/21   Dohmeier, Asencion Partridge, MD  diphenhydrAMINE HCl, Sleep, (ZZZQUIL PO) Take 30 mLs by mouth at bedtime.    [provider]  halobetasol (ULTRAVATE) 0.05 % cream APPLY TO AFFECTED AREA TWICE A DAY 07/02/20   [name] Damme, MD  levETIRAcetam (KEPPRA) 100 MG/ML solution Take 15 mLs (1,500 mg total) by mouth 2 (two) times daily. 03/28/22 09/24/22  Lomax, Amy, NP  VIMPAT 200 MG TABS tablet Take 1 tablet (200 mg total) by mouth 2 (two) times daily. Brand Name medically necessary 07/26/22   Penni Bombard, MD      Allergies    Patient has no known allergies.    Review of Systems   Review of Systems  Physical Exam Updated Vital Signs BP 125/86   Pulse 96   Temp 99.3 F (37.4 C)   Resp 19   Ht 6' (1.829 m)   Wt 112 kg   SpO2 100%   BMI 33.49 kg/m  Physical Exam Vitals and nursing note reviewed.  Constitutional:      General: He is not in acute distress.    Appearance: He is well-developed. He is not diaphoretic.  HENT:     Head: Normocephalic and atraumatic.  Eyes:     Conjunctiva/sclera: Conjunctivae normal.  Cardiovascular:     Rate and Rhythm: Normal rate and regular rhythm.     Heart sounds: Normal heart sounds. No murmur  heard.    No friction rub. No gallop.  Pulmonary:     Effort: Pulmonary effort is normal. No respiratory distress.     Breath sounds: Normal breath sounds. No wheezing or rales.  Abdominal:     General: There is no distension.     Palpations: Abdomen is soft.     Tenderness: There is no abdominal tenderness. There is no guarding.  Musculoskeletal:     Cervical back: Normal range of motion.  Skin:    General: Skin is warm and dry.  Neurological:     Mental Status: He is alert and oriented to person, place, and time.     GCS: GCS eye subscore is 4. GCS verbal subscore is 5. GCS motor subscore is 6.     Cranial Nerves: Cranial nerves 2-12 are intact.     Sensory: Sensation is intact. No sensory deficit.     Motor: Motor function is  intact. No weakness.     Coordination: Coordination is intact. Coordination normal.     Comments: Some confusion following commands at times, however strength and sensation appear intact.      ED Results / Procedures / Treatments   Labs (all labs ordered are listed, but only abnormal results are displayed) Labs Reviewed  RESP PANEL BY RT-PCR (RSV, FLU A&B, COVID)  RVPGX2 - Abnormal; Notable for the following components:      Result Value   SARS Coronavirus 2 by RT PCR POSITIVE (*)    All other components within normal limits  CBC WITH DIFFERENTIAL/PLATELET - Abnormal; Notable for the following components:   MCV 78.2 (*)    MCH 25.8 (*)    Monocytes Absolute 1.1 (*)    All other components within normal limits  COMPREHENSIVE METABOLIC PANEL - Abnormal; Notable for the following components:   Glucose, Bld 120 (*)    Creatinine, Ser 1.45 (*)    Calcium 8.6 (*)    Albumin 3.4 (*)    All other components within normal limits  CARBAMAZEPINE LEVEL, TOTAL  D-DIMER, QUANTITATIVE  LIPASE, BLOOD  LEVETIRACETAM LEVEL  LACOSAMIDE  TROPONIN I (HIGH SENSITIVITY)  TROPONIN I (HIGH SENSITIVITY)    EKG EKG Interpretation  Date/Time:  Wednesday August 17 2022 09:37:20 EST Ventricular Rate:  109 PR Interval:  169 QRS Duration: 98 QT Interval:  311 QTC Calculation: 419 R Axis:   -21 Text Interpretation: Sinus tachycardia Probable left ventricular hypertrophy Borderline T abnormalities, diffuse leads New TW abnormalities in comparison to prior ECG Confirmed by Gareth Morgan [phone]) on 08/17/2022 10:42:56 AM  Radiology DG Chest Portable 1 View  Result Date: 08/17/2022 CLINICAL DATA:  Chest pain, loss of consciousness EXAM: PORTABLE CHEST 1 VIEW COMPARISON:  04/30/2018 FINDINGS: Normal heart size and vascularity. Low lung volumes. No focal pneumonia, collapse or consolidation. Negative for edema, effusion or pneumothorax. Trachea midline. IMPRESSION: Low volume exam without acute  process. Electronically Signed   By: Jerilynn Mages.  Shick M.D.   On: 08/17/2022 10:34    Procedures Procedures    Medications Ordered in ED Medications  lactated ringers bolus 1,000 mL (0 mLs Intravenous Stopped 08/17/22 1142)    ED Course/ Medical Decision Making/ A&P                              22 year old male with a history of seizure disorder followed by [facility], on Keppra 1500 mg, carbamazepine 600 mg, and lacosamide 200 mg, autism spectrum disorder  with small amount of verbal communication at baseline who presents with concern for syncope.   DDx for syncope includes cardiac arrhythmia, MI, PE, electrolyte abnormality, hypovolemia including dehydration and anemia/GI bleed, infection as well as seizures, CVA, ICH.  EKG evaluated by me and shows new TW inversions in comparison to prior.   Labs completed and personally evaluated by me show a normal ddimer, normal troponin x2, normal lipase, no anemia, no significant electrolyte abnormalities.   CXR without pneumonia, pneumothorax, cardiac enlargement or enlarged mediastinum.  He had returned to baseline on arrival to the ED and continues to be well appearing without other acute concerns, improved vital signs, and no evidence of other syncopal episodes/arrhythmia or seizures while in the ED.  Do not suspect PE, ACS or dissection given normal troponins, negative ddimer.   No new focal neurologic abnormalities on exam.   COVID 19 testing returned positive, multiple sick family members.  Suspect syncope in setting of COVID 19, possible dehydration.   Given IV fluids in the ED.  Recommend follow up with PCP, return for new or worsening symptoms.          Final Clinical Impression(s) / ED Diagnoses Final diagnoses:  Syncope and collapse  COVID-19    Rx / DC Orders ED Discharge Orders     None         Gareth Morgan, MD 08/17/22 2340

## 2022-08-18 LAB — LEVETIRACETAM LEVEL: Levetiracetam Lvl: 11.2 ug/mL (ref 10.0–40.0)

## 2022-08-22 LAB — LACOSAMIDE: Lacosamide: 1.8 ug/mL — ABNORMAL LOW (ref 5.0–10.0)

## 2022-08-23 ENCOUNTER — Telehealth: Payer: Self-pay | Admitting: *Deleted

## 2022-08-23 NOTE — Transitions of Care (Post Inpatient/ED Visit) (Unsigned)
   08/23/2022  Name: Billy Ayala MRN: [id number] DOB: 2000-12-25  {[id number]}

## 2022-09-06 NOTE — Patient Instructions (Signed)
Below is our plan:  We will continue levetiracetam '1500mg'$  twice daily, Vimpat '200mg'$  twice daily and carbamazepine '600mg'$  twice daily.   Please make sure you are consistent with timing of seizure medication. I recommend annual visit with primary care provider (PCP) for complete physical and routine blood work. I recommend daily intake of vitamin D (400-800iu) and calcium (800-'1000mg'$ ) for bone health. Discuss Dexa screening with PCP.   According to [place] law, you can not drive unless you are seizure / syncope free for at least 6 months and under physician's care.  Please maintain precautions. Do not participate in activities where a loss of awareness could harm you or someone else. No swimming alone, no tub bathing, no hot tubs, no driving, no operating motorized vehicles (cars, ATVs, motocycles, etc), lawnmowers, power tools or firearms. No standing at heights, such as rooftops, ladders or stairs. Avoid hot objects such as stoves, heaters, open fires. Wear a helmet when riding a bicycle, scooter, skateboard, etc. and avoid areas of traffic. Set your water heater to 120 degrees or less.  Please make sure you are staying well hydrated. I recommend 50-60 ounces daily. Well balanced diet and regular exercise encouraged. Consistent sleep schedule with 6-8 hours recommended.   Please continue follow up with care team as directed.   Follow up with me in 1 year   You may receive a survey regarding today's visit. I encourage you to leave honest feed back as I do use this information to improve patient care. Thank you for seeing me today!

## 2022-09-06 NOTE — Progress Notes (Unsigned)
No chief complaint on file.    HISTORY OF PRESENT ILLNESS:  09/06/22 ALL:  Billy Ayala returns for follow up for seizures. He continues  levetiracetam '1500mg'$ , carbamazepine '600mg'$  and Vimpat (Brand medically necessary) '200mg'$  BID.  Mom called to report concerns of possible breakthrough seizure activity in 04/2022. Labs were normal. No changes in treatment plan. He was seen in the ER 08/17/2022 for concerns of syncope. Lacosamide historically low. 1.8 in ER. Lev and carb levels normal. He was positive for COVID. IV fluids given and he was discharged home.   Since,   09/06/2021 ALL: Billy Ayala is a 22 y.o. male here today for follow up for seizure disorder. He continues levetiracetam '1500mg'$ , carbamazepine '600mg'$  and Vimpat '200mg'$  BID. He is doing well. No seizure activity.   Mom reports BP is always elevated at MD office. Readings always recover once home. She denies any obvious symptoms of elevated BP at home. He is sleeping well. He has a good appetite. He does not drive. He has not had recent labs. He is fearful of needles. She requests to update labs with upcoming PCP visit for Covid booster.    HISTORY (copied from Dr Gladstone Lighter previous note)  UPDATE (04/27/20, VRP): Since last visit, doing well. Symptoms are stable. Severity is mild. No alleviating or aggravating factors. Tolerating meds. Last sz in Nov 2020.   UPDATE (04/22/19, VRP): Since last visit, doing well. Symptoms are stable. No seizures. No alleviating or aggravating factors. Tolerating meds. Some crying spells intermittently.    UPDATE (09/03/18, VRP): Since last visit, had mild sz (10-15 seconds) on 07/24/18. Went to ER and was stable. Since then, now on higher CBZ dosing and doing well. No more events. Intermittent mild stuttering. No other side effects.    UPDATE (05/14/18, VRP): Since last visit, had another seizure and admission in oct 2019. Had aspiration, resp failure, hemoptysis, and ICU support. Now on vimpat +  levetiracetam. Some decr appetite and energy. No more seizures.     UPDATE (03/19/18, VRP): Since last visit, had 2 more seizures. LEV has been increased. Last sz no 03/11/18. No alleviating or aggravating factors. Tolerating medication.    PRIOR HPI (02/27/18): 22 year old male here for evaluation of seizures.  History of autism.   June 2019 patient was at home, collapsed and had generalized convulsions.  Patient was taken to the emergency room and then began to cough and vomit blood.  Patient was also diagnosed with diffuse alveolar hemorrhage.  He was started on antiseizure medication.  No specific etiology for hemoptysis was found.   July 2019 patient returned to the hospital for another seizure.  Again patient began to have pulmonary edema and hemoptysis.  Patient was immediately started on steroids and antiseizure medication dose was increased.   Since that time patient has been doing well.  No further seizures or hemoptysis.  He has been following up with PCP and pulmonary clinic.  He has additional pulmonary testing scheduled for October 2019.   No prior history of seizures.  There is family history of seizure in patient's father and paternal aunt.  No side effects of antiseizure medication at this time.   REVIEW OF SYSTEMS: Out of a complete 14 system review of symptoms, the patient complains only of the following symptoms, cognitive delay, congenital and all other reviewed systems are negative.   ALLERGIES: No Known Allergies   HOME MEDICATIONS: Outpatient Medications Prior to Visit  Medication Sig Dispense Refill   carbamazepine (CARBATROL) 200 MG  12 hr capsule Take 3 capsules (600 mg total) by mouth 2 (two) times daily. 540 capsule 3   diphenhydrAMINE HCl, Sleep, (ZZZQUIL PO) Take 30 mLs by mouth at bedtime.     halobetasol (ULTRAVATE) 0.05 % cream APPLY TO AFFECTED AREA TWICE A DAY 50 g 0   levETIRAcetam (KEPPRA) 100 MG/ML solution Take 15 mLs (1,500 mg total) by mouth 2 (two)  times daily. 2700 mL 1   VIMPAT 200 MG TABS tablet Take 1 tablet (200 mg total) by mouth 2 (two) times daily. Brand Name medically necessary 180 tablet 0   No facility-administered medications prior to visit.     PAST MEDICAL HISTORY: Past Medical History:  Diagnosis Date   Autism    Seizure ([place])    sz 07/24/2018   Sickle cell trait ([place])      PAST SURGICAL HISTORY: No past surgical history on file.   FAMILY HISTORY: Family History  Problem Relation Age of Onset   Asthma Mother    Hypertension Father    Sickle cell trait Father    Bipolar disorder Father    Asthma Sister    Hypertension Maternal Grandmother    Early death Neg Hx      SOCIAL HISTORY: Social History   Socioeconomic History   Marital status: Single    Spouse name: Not on file   Number of children: Not on file   Years of education: Not on file   Highest education level: Not on file  Occupational History   Not on file  Tobacco Use   Smoking status: Never   Smokeless tobacco: Never  Vaping Use   Vaping Use: Never used  Substance and Sexual Activity   Alcohol use: Never   Drug use: Never   Sexual activity: Not on file  Other Topics Concern   Not on file  Social History Narrative   Lives with mom, three half brothers and boyfriend      Goes to [employer] full school days (6th grader)   Right handed    No caffeine    Social Determinants of Radio broadcast assistant Strain: Not on file  Food Insecurity: Not on file  Transportation Needs: Not on file  Physical Activity: Not on file  Stress: Not on file  Social Connections: Not on file  Intimate Partner Violence: Not on file     PHYSICAL EXAM  There were no vitals filed for this visit.  There is no height or weight on file to calculate BMI.  Generalized: Well developed, in no acute distress  Cardiology: normal rate and rhythm, no murmur auscultated  Respiratory: clear to auscultation bilaterally    Neurological examination   Mentation: Alert, oriented to person, decreased memory, congenital cognitive delays, Follows simple commands speech and language fluent Cranial nerve II-XII: Pupils were equal round reactive to light. Extraocular movements were full, visual field were full on confrontational test. Facial sensation and strength were normal. Head turning and shoulder shrug  were normal and symmetric. Motor: The motor testing reveals 5 over 5 strength of all 4 extremities. Good symmetric motor tone is noted throughout.   Gait and station: Gait is normal.    DIAGNOSTIC DATA (LABS, IMAGING, TESTING) - I reviewed patient records, labs, notes, testing and imaging myself where available.  Lab Results  Component Value Date   WBC 9.2 08/17/2022   HGB 14.9 08/17/2022   HCT 45.2 08/17/2022   MCV 78.2 (L) 08/17/2022   PLT 321 08/17/2022  Component Value Date/Time   NA 138 08/17/2022 0950   NA 142 05/05/2022 0813   K 3.6 08/17/2022 0950   CL 102 08/17/2022 0950   CO2 24 08/17/2022 0950   GLUCOSE 120 (H) 08/17/2022 0950   BUN 9 08/17/2022 0950   BUN 10 05/05/2022 0813   CREATININE 1.45 (H) 08/17/2022 0950   CALCIUM 8.6 (L) 08/17/2022 0950   PROT 7.4 08/17/2022 0950   PROT 8.1 05/05/2022 0813   ALBUMIN 3.4 (L) 08/17/2022 0950   ALBUMIN 4.5 05/05/2022 0813   AST 27 08/17/2022 0950   ALT 27 08/17/2022 0950   ALKPHOS 99 08/17/2022 0950   BILITOT 0.4 08/17/2022 0950   BILITOT <0.2 05/05/2022 0813   GFRNONAA >60 08/17/2022 0950   GFRAA >60 05/03/2018 0955   Lab Results  Component Value Date   TRIG 142 04/30/2018   No results found for: "HGBA1C" Lab Results  Component Value Date   VITAMINB12 248 01/15/2018   Lab Results  Component Value Date   TSH 0.422 12/14/2017        No data to display               No data to display           ASSESSMENT AND PLAN  22 y.o. year old male  has a past medical history of Autism, Seizure ([place]), and Sickle cell trait ([place]). here with    No  diagnosis found.  Amandeep is doing well, today. No recent seizure activity. He will continue levetiracetam '1500mg'$ , lacosamide '200mg'$  and carbamazepine '600mg'$  twice daily. We will order labs with upcoming PCP visit for Covid booster per his mother's request. Lab orders placed in Epic. Healthy lifestyle habits encouraged. He will follow up with me in 1 year, sooner if needed. His mother verbalizes understanding and agreement with this plan.    No orders of the defined types were placed in this encounter.    No orders of the defined types were placed in this encounter.     Debbora Presto, MSN, FNP-C 09/06/2022, 1:02 PM  [facility] [address] [place], [place] [address] [phone]

## 2022-09-07 ENCOUNTER — Encounter: Payer: Self-pay | Admitting: Family Medicine

## 2022-09-07 ENCOUNTER — Ambulatory Visit: Payer: Medicaid Other | Admitting: Family Medicine

## 2022-09-07 VITALS — BP 135/95 | HR 88 | Ht 74.0 in | Wt 244.0 lb

## 2022-09-07 DIAGNOSIS — G40909 Epilepsy, unspecified, not intractable, without status epilepticus: Secondary | ICD-10-CM | POA: Diagnosis not present

## 2022-09-07 MED ORDER — CARBAMAZEPINE ER 200 MG PO CP12: 600.0000 mg | ORAL_CAPSULE | Freq: Two times a day (BID) | ORAL | 3 refills | Status: DC

## 2022-09-07 MED ORDER — VIMPAT 200 MG PO TABS: 200.0000 mg | ORAL_TABLET | Freq: Two times a day (BID) | ORAL | 1 refills | Status: DC

## 2022-09-07 MED ORDER — LEVETIRACETAM 100 MG/ML PO SOLN: 1500.0000 mg | Freq: Two times a day (BID) | ORAL | 3 refills | Status: DC

## 2023-01-20 ENCOUNTER — Encounter: Payer: Self-pay | Admitting: Family Medicine

## 2023-01-23 ENCOUNTER — Other Ambulatory Visit: Payer: Self-pay | Admitting: Family Medicine

## 2023-01-23 DIAGNOSIS — G40909 Epilepsy, unspecified, not intractable, without status epilepticus: Secondary | ICD-10-CM

## 2023-01-23 MED ORDER — CLOBAZAM 2.5 MG/ML PO SUSP: 5.0000 mg | Freq: Two times a day (BID) | ORAL | 1 refills | Status: DC

## 2023-01-23 NOTE — Telephone Encounter (Signed)
Sent ppwrk to [facility] for EEG

## 2023-01-25 ENCOUNTER — Telehealth: Payer: Self-pay | Admitting: *Deleted

## 2023-01-25 NOTE — Telephone Encounter (Signed)
Received this message from scheduling team at [facility]:  "Good afternoon, I hope you've been well. I wanted to let you know that Billy Ayala (DOB:2001) is scheduled for their In-Home Video EEG from 8/13-8/16, per request. Please let us know if you have any questions or comments.  Have a Great Day and thank you for entrusting [place] with your patient!"

## 2023-01-26 ENCOUNTER — Other Ambulatory Visit: Payer: Self-pay | Admitting: Family Medicine

## 2023-01-26 MED ORDER — VIMPAT 200 MG PO TABS: 200.0000 mg | ORAL_TABLET | Freq: Two times a day (BID) | ORAL | 1 refills | Status: DC

## 2023-01-26 NOTE — Telephone Encounter (Signed)
Requested Prescriptions   Pending Prescriptions Disp Refills   VIMPAT 200 MG TABS tablet 180 tablet 1    Sig: Take 1 tablet (200 mg total) by mouth 2 (two) times daily. Brand Name medically necessary   Last seen 09/07/22, next appt 04/11/23 Dispenses   Dispensed Days Supply Quantity Provider Pharmacy  VIMPAT 200 MG TABLET 10/25/2022 90 180 each Shawnie Dapper, NP [facility] [phone] - G...  VIMPAT 200 MG TABLET 07/28/2022 90 180 each Penumalli, Glenford Bayley, MD [facility] [phone] - G...  LACOSAMIDE 200 MG TABLET 06/28/2022 30 60 each Lomax, Amy, NP [id number] - [facility]...  LACOSAMIDE 200 MG TABLET 05/30/2022 30 60 each Lomax, Amy, NP [id number] - [facility]...  LACOSAMIDE 200 MG TABLET 04/30/2022 30 60 each Lomax, Amy, NP [id number] - [facility]...  VIMPAT 200 MG TABLET 03/29/2022 30 60 each Lomax, Amy, NP [facility] [phone] - G...  VIMPAT 200 MG TABLET 01/30/2022 60 120 each Dohmeier, Porfirio Mylar, MD [facility] [phone] - G.Marland KitchenMarland Kitchen

## 2023-01-26 NOTE — Telephone Encounter (Signed)
Pt's mother called stating that the pt's  VIMPAT 200 MG TABS tablet is not available at the pharmacy where it was sent to so she is needing [facility] [address]

## 2023-02-01 ENCOUNTER — Ambulatory Visit (INDEPENDENT_AMBULATORY_CARE_PROVIDER_SITE_OTHER): Payer: Medicaid Other | Admitting: Student

## 2023-02-01 VITALS — BP 127/75 | HR 78 | Ht 73.74 in | Wt 251.8 lb

## 2023-02-01 DIAGNOSIS — L309 Dermatitis, unspecified: Secondary | ICD-10-CM | POA: Diagnosis not present

## 2023-02-01 DIAGNOSIS — L308 Other specified dermatitis: Secondary | ICD-10-CM

## 2023-02-01 DIAGNOSIS — R21 Rash and other nonspecific skin eruption: Secondary | ICD-10-CM

## 2023-02-01 LAB — POCT SKIN KOH: Skin KOH, POC: NEGATIVE

## 2023-02-01 MED ORDER — TRIAMCINOLONE ACETONIDE 0.5 % EX OINT: 1.0000 | TOPICAL_OINTMENT | Freq: Two times a day (BID) | CUTANEOUS | 0 refills | Status: AC

## 2023-02-01 MED ORDER — DOXYCYCLINE HYCLATE 100 MG PO TABS: 100.0000 mg | ORAL_TABLET | Freq: Every day | ORAL | 0 refills | Status: AC

## 2023-02-01 NOTE — Patient Instructions (Addendum)
It was great to see you! Thank you for allowing me to participate in your care!   I recommend that you always bring your medications to each appointment as this makes it easy to ensure we are on the correct medications and helps [place] not miss when refills are needed.  Our plans for today:  - Take Doxycycline 100 mg daily for 1 month - Do not put steroid cream on face - Okay to use non-scented face moisturizer  - Use Triamcinolone twice daily for two weeks on back.  - Use Vaseline or Aquaphor on back after showers/baths, and to keep skin protected during the day  We are checking some labs today, I will call you if they are abnormal will send you a MyChart message or a letter if they are normal.  If you do not hear about your labs in the next 2 weeks please let us know.  Take care and seek immediate care sooner if you develop any concerns. Please remember to show up 15 minutes before your scheduled appointment time!  Tiffany Kocher, DO [facility]

## 2023-02-01 NOTE — Progress Notes (Signed)
    SUBJECTIVE:   CHIEF COMPLAINT / HPI:   Rash mouth neck  back Patient has history of eczema, previously treated with high-dose steroids.  Mother reports rash on periorbital region, worsening over the past few months.  She has been steroids, which have worsened the rash.  Rash on back is scaly, pruritic and hyperpigmented-present for many months.  Rash on neck, appears to be more related to spreading hyperpigmented nevi.  Not pruritic.  No other systemic symptoms.  Mother has used high-dose steroid cream intermittently on the back and neck as well, but not daily.  PERTINENT  PMH / PSH: Eczema, sickle cell trait, autism spectrum disorder  OBJECTIVE:   Pulse 90   Ht 6' 1.74" (1.873 m)   Wt 251 lb 12.8 oz (114.2 kg)   SpO2 100%   BMI 32.56 kg/m    General: NAD, well-appearing, well-nourished Respiratory: No respiratory distress, breathing comfortably, able to speak in full sentences Skin: warm and dry.  Nonerythematous, peeling and dry skin in periorbital region.  Scaly, erythematous, hyperpigmented rash on central back.  Multiple nevi and hyperpigmentation of skin on neck.  See images below. Psych: Appropriate affect and mood         ASSESSMENT/PLAN:   Rash on face Primarily suspect periorbital dermatitis, in setting of worsened with steroids.  Differential includes atopic and contact dermatitis, xerotic dermatitis. -Trial doxycycline 100 mg daily for 30 days - Follow-up if rash does not improve  Rash on back KOH negative for hyphae.  Provide suspect atopic dermatitis.  Differential includes contact dermatitis, nummular eczema. -Trial triamcinolone twice a day for 2 weeks - Follow-up if rash worsens or does not improve.  Consider high-dose steroids versus biopsy at that time.  Rash on neck Hyperpigmented skin and multiple scattered nevi.  Currently suspect acanthosis with multiple nevi.  Unclear if nevi are spreading, will monitor at this time-image placed in chart for  reference. - OTC Lac Hydrin   Tiffany Kocher, DO [facility]

## 2023-02-15 NOTE — Telephone Encounter (Signed)
Received this message from scheduling team at [facility]:   I am reaching out in regards to patient Billy Ayala DOB [birth year] to let you know that they have now scheduled for 8/23-8/26 instead. Thank you!

## 2023-02-20 NOTE — Telephone Encounter (Signed)
Received message from [employer]. The EEG is scheduled for scheduled for 8/23-8/26.

## 2023-02-24 DIAGNOSIS — G40909 Epilepsy, unspecified, not intractable, without status epilepticus: Secondary | ICD-10-CM

## 2023-03-03 ENCOUNTER — Other Ambulatory Visit: Payer: Self-pay | Admitting: Neurology

## 2023-03-03 DIAGNOSIS — G40909 Epilepsy, unspecified, not intractable, without status epilepticus: Secondary | ICD-10-CM

## 2023-03-03 NOTE — Procedures (Signed)
   Clinical History: This is a 22 y/o M who presents with follow up for seizures. He has been doing well with no seizure activity.  INTERMITTENT MONITORING with VIDEO TECHNICAL SUMMARY: This AVEEG was performed using equipment provided by [employer] utilizing Bluetooth ( [employer]it ) amplifiers with continuous EEGT attended video collection using encrypted remote transmission via [employer] secured cellular tower network with data rates for each AVEEG performed. This is a Therapist, music AVEEG, obtained, according to the 10-20 international electrode placement system, reformatted digitally into referential and bipolar montages. Data was acquired with a minimum of 21 bipolar connections and sampled at a minimum rate of 250 cycles per second per channel, maximum rate of 450 cycles per second per channel and two channels for EKG. The entire VEEG study was recorded through cable and or radio telemetry for subsequent analysis. Specified epochs of the AVEEG data were identified at the direction of the subject by the depression of a push button by the patient. Each patients event file included data acquired two minutes prior to the push button activation and continuing until two minutes afterwards. AVEEG files were reviewed on [facility] server, Licensed Software provided by [employer] with a digital high frequency filter set at 70 Hz and a low frequency filter set at 1 Hz with a paper speed of 43mm/s resulting in 10 seconds per digital page. This entire AVEEG was reviewed by the EEG Technologist. Random time samples, random sleep samples, clips, patient initiated push button files with included patient daily diary logs, EEG Technologist pruned data was reviewed and verified for accuracy and validity by the governing reading neurologist in full details. This AEEGV was fully compliant with all requirements for CPT 97500 for setup, patient education, take down and administered by an EEG  technologist.  Long-Term EEG with Video was monitored intermittently by a qualified EEG technologist for the entirety of the recording; quality check-ins were performed at a minimum of every two hours, checking and documenting real-time data and video to assure the integrity and quality of the recording (e.g., camera position, electrode integrity and impedance), and identify the need for maintenance. For intermittent monitoring, an EEG Technologist monitored no more than 12 patients concurrently. Diagnostic video was captured at least 80% of the time during the recording.  PATIENT EVENTS: There were no patient events noted or captured during this recording.  TECHNOLOGIST EVENTS: No clear epileptiform activity was detected by the reviewing neurodiagnostic technologist during the recording for further evaluation.  TIME SAMPLES: 10-minutes of every 2 hours recorded are reviewed as random time samples.  SLEEP SAMPLES: 5-minutes of every 24 hours recorded are reviewed as random sleep samples.  AWAKE: At maximal level of alertness, the posterior dominant background activity was continuous, reactive, low voltage rhythm of 12-12.5 Hz. This was symmetric, well-modulated, and attenuated with eye opening. Diffuse, symmetric, frontocentral beta range activity was present.  SLEEP: N1 Sleep (Stage 1) was observed and characterized by the disappearance of alpha rhythm and the appearance of vertex activity. N2 Sleep (Stage 2) was observed and characterized by vertex waves, K-complexes, and sleep spindles. N3 (Stage 3) sleep was observed and characterized by high amplitude Delta activity of 20%. REM sleep was observed.  EKG: There were no arrhythmias or abnormalities noted during this recording.   Impression: This is a normal 72 hours ambulatory video EEG. There were no seizures, events or epileptiform discharges seen during this recording. Normal EEG.   Windell Norfolk, MD [facility]

## 2023-03-08 ENCOUNTER — Encounter: Payer: Self-pay | Admitting: Family Medicine

## 2023-03-17 ENCOUNTER — Encounter: Payer: Self-pay | Admitting: Family Medicine

## 2023-04-06 NOTE — Progress Notes (Signed)
Chief Complaint  Patient presents with   Room 1    Pt is here with his mother. Pt's mother states that pt had 2 seizures one in June and 1 in July. Pt's mother states that he did pass out with his seizure.      HISTORY OF PRESENT ILLNESS:  04/11/23 ALL:  Billy Ayala returns for follow up for seizures. He was last seen 09/2022 and reported breakthrough seizure 04/2022 in setting of COVID. He was doing well at the time and continued continued levetiracetam 1500mg , carbamazepine 600mg  and Vimpat (Brand medically necessary) 200mg  BID. Mom called to report he had 2 seizures in June and 1 in July. Ambulatory EEG was normal. Onfi 5mg  BID added. Since, mom reports he is doing well. No further events. Mom described the last three events occurred similarly. He would make an usual gasping noise and fall backwards. He appeared to be unconscious for approx 5 seconds then seemed back to baseline. No tonic clonic activity. No post ictal period. His blood pressure is always elevated at our visits. Mom reports BP is elevated every time he sees a doctor. She does not have a cuff at home. He stays well hydrated. Great appetite. He seems to be doing well from a mood/behavioral standpoint.   09/07/2022 ALL: Billy Ayala returns for follow up for seizures. He continues  levetiracetam 1500mg , carbamazepine 600mg  and Vimpat (Brand medically necessary) 200mg  BID.  Mom called to report concerns of possible breakthrough seizure activity in 04/2022. Labs were normal. No changes in treatment plan. He was seen in the ER 08/17/2022 for concerns of syncope. Lacosamide historically low. 1.8 in ER. Lev and carb levels normal. He was positive for COVID. IV fluids given and he was discharged home.   Since, mom reports he has done well. He seems to have recovered from COVID. He is staying well hydrated. Sleeping well. No seizure events. He is taking medicaitons at 7:30am and 7:30pm.   09/06/2021 ALL: Billy Ayala is a 22 y.o. male here  today for follow up for seizure disorder. He continues levetiracetam 1500mg , carbamazepine 600mg  and Vimpat 200mg  BID. He is doing well. No seizure activity.   Mom reports BP is always elevated at MD office. Readings always recover once home. She denies any obvious symptoms of elevated BP at home. He is sleeping well. He has a good appetite. He does not drive. He has not had recent labs. He is fearful of needles. She requests to update labs with upcoming PCP visit for Covid booster.   HISTORY (copied from Dr Richrd Humbles previous note)  UPDATE (04/27/20, VRP): Since last visit, doing well. Symptoms are stable. Severity is mild. No alleviating or aggravating factors. Tolerating meds. Last sz in Nov 2020.   UPDATE (04/22/19, VRP): Since last visit, doing well. Symptoms are stable. No seizures. No alleviating or aggravating factors. Tolerating meds. Some crying spells intermittently.    UPDATE (09/03/18, VRP): Since last visit, had mild sz (10-15 seconds) on 07/24/18. Went to ER and was stable. Since then, now on higher CBZ dosing and doing well. No more events. Intermittent mild stuttering. No other side effects.    UPDATE (05/14/18, VRP): Since last visit, had another seizure and admission in oct 2019. Had aspiration, resp failure, hemoptysis, and ICU support. Now on vimpat + levetiracetam. Some decr appetite and energy. No more seizures.     UPDATE (03/19/18, VRP): Since last visit, had 2 more seizures. LEV has been increased. Last sz no 03/11/18. No alleviating or  aggravating factors. Tolerating medication.    PRIOR HPI (02/27/18): 22 year old male here for evaluation of seizures.  History of autism.   June 2019 patient was at home, collapsed and had generalized convulsions.  Patient was taken to the emergency room and then began to cough and vomit blood.  Patient was also diagnosed with diffuse alveolar hemorrhage.  He was started on antiseizure medication.  No specific etiology for hemoptysis was  found.   July 2019 patient returned to the hospital for another seizure.  Again patient began to have pulmonary edema and hemoptysis.  Patient was immediately started on steroids and antiseizure medication dose was increased.   Since that time patient has been doing well.  No further seizures or hemoptysis.  He has been following up with PCP and pulmonary clinic.  He has additional pulmonary testing scheduled for October 2019.   No prior history of seizures.  There is family history of seizure in patient's father and paternal aunt.  No side effects of antiseizure medication at this time.   REVIEW OF SYSTEMS: Out of a complete 14 system review of symptoms, the patient complains only of the following symptoms, cognitive delay, congenital and all other reviewed systems are negative.   ALLERGIES: No Known Allergies   HOME MEDICATIONS: Outpatient Medications Prior to Visit  Medication Sig Dispense Refill   carbamazepine (CARBATROL) 200 MG 12 hr capsule Take 3 capsules (600 mg total) by mouth 2 (two) times daily. 540 capsule 3   cloBAZam (ONFI) 2.5 MG/ML solution Take 2 mLs (5 mg total) by mouth 2 (two) times daily. 360 mL 1   diphenhydrAMINE HCl, Sleep, (ZZZQUIL PO) Take 30 mLs by mouth at bedtime.     doxycycline (VIBRA-TABS) 100 MG tablet Take 1 tablet (100 mg total) by mouth daily. 30 tablet 0   levETIRAcetam (KEPPRA) 100 MG/ML solution Take 15 mLs (1,500 mg total) by mouth 2 (two) times daily. 2700 mL 3   triamcinolone ointment (KENALOG) 0.5 % Apply 1 Application topically 2 (two) times daily. 30 g 0   VIMPAT 200 MG TABS tablet Take 1 tablet (200 mg total) by mouth 2 (two) times daily. Brand Name medically necessary 180 tablet 1   No facility-administered medications prior to visit.     PAST MEDICAL HISTORY: Past Medical History:  Diagnosis Date   Autism    Seizure (HCC)    sz 07/24/2018   Sickle cell trait (HCC)      PAST SURGICAL HISTORY: History reviewed. No pertinent  surgical history.   FAMILY HISTORY: Family History  Problem Relation Age of Onset   Asthma Mother    Hypertension Father    Sickle cell trait Father    Bipolar disorder Father    Asthma Sister    Hypertension Maternal Grandmother    Early death Neg Hx      SOCIAL HISTORY: Social History   Socioeconomic History   Marital status: Single    Spouse name: Not on file   Number of children: Not on file   Years of education: Not on file   Highest education level: Not on file  Occupational History   Not on file  Tobacco Use   Smoking status: Never   Smokeless tobacco: Never  Vaping Use   Vaping status: Never Used  Substance and Sexual Activity   Alcohol use: Never   Drug use: Never   Sexual activity: Not on file  Other Topics Concern   Not on file  Social History Narrative  Lives with mom, three half brothers and boyfriend      Goes to [employer] full school days (6th grader)   Right handed    No caffeine    Social Determinants of Health   Financial Resource Strain: Not on file  Food Insecurity: Not on file  Transportation Needs: Not on file  Physical Activity: Not on file  Stress: Not on file  Social Connections: Not on file  Intimate Partner Violence: Not on file     PHYSICAL EXAM  Vitals:   04/11/23 0802 04/11/23 0834  BP: (!) 147/108 (!) 150/100  Pulse: 83   Weight: 253 lb (114.8 kg)   Height: 6\' 2"  (1.88 m)      Body mass index is 32.48 kg/m.  Generalized: Well developed, in no acute distress  Cardiology: normal rate and rhythm, no murmur auscultated  Respiratory: clear to auscultation bilaterally    Neurological examination  Mentation: Alert, oriented to person, decreased memory, congenital cognitive delays, Follows simple commands speech and language fluent Cranial nerve II-XII: Pupils were equal round reactive to light. Extraocular movements were full, visual field were full on confrontational test. Facial sensation and strength were normal.  Head turning and shoulder shrug  were normal and symmetric. Motor: The motor testing reveals 5 over 5 strength of all 4 extremities. Good symmetric motor tone is noted throughout.   Gait and station: Gait is normal.    DIAGNOSTIC DATA (LABS, IMAGING, TESTING) - I reviewed patient records, labs, notes, testing and imaging myself where available.  Lab Results  Component Value Date   WBC 9.2 08/17/2022   HGB 14.9 08/17/2022   HCT 45.2 08/17/2022   MCV 78.2 (L) 08/17/2022   PLT 321 08/17/2022      Component Value Date/Time   NA 138 08/17/2022 0950   NA 142 05/05/2022 0813   K 3.6 08/17/2022 0950   CL 102 08/17/2022 0950   CO2 24 08/17/2022 0950   GLUCOSE 120 (H) 08/17/2022 0950   BUN 9 08/17/2022 0950   BUN 10 05/05/2022 0813   CREATININE 1.45 (H) 08/17/2022 0950   CALCIUM 8.6 (L) 08/17/2022 0950   PROT 7.4 08/17/2022 0950   PROT 8.1 05/05/2022 0813   ALBUMIN 3.4 (L) 08/17/2022 0950   ALBUMIN 4.5 05/05/2022 0813   AST 27 08/17/2022 0950   ALT 27 08/17/2022 0950   ALKPHOS 99 08/17/2022 0950   BILITOT 0.4 08/17/2022 0950   BILITOT <0.2 05/05/2022 0813   GFRNONAA >60 08/17/2022 0950   GFRAA >60 05/03/2018 0955   Lab Results  Component Value Date   TRIG 142 04/30/2018   No results found for: "HGBA1C" Lab Results  Component Value Date   VITAMINB12 248 01/15/2018   Lab Results  Component Value Date   TSH 0.422 12/14/2017        No data to display               No data to display           ASSESSMENT AND PLAN  22 y.o. year old male  has a past medical history of Autism, Seizure (HCC), and Sickle cell trait (HCC). here with    Seizure disorder (HCC) - Plan: Lacosamide, Levetiracetam level, CLOBAZAM, Carbamazepine level, total, CMP  Seizure-like activity (HCC)  Autism spectrum disorder  Billy Ayala is doing well, today. He did have a questionable events in 04/2022, 12/2022 and 01/2023 but none since. Events described by mom are not consistent with  previous seizure events and concerning  for possible syncopal events. Ambulatory EEG was normal. I will update labs. He will continue levetiracetam 1500mg , lacosamide 200mg , carbamazepine 600mg  twice daily and Onfi 5mg  twice daily. I have encouraged mom to call PCP to discuss possible syncopal events and elevated BP. Manual reading 150/100, in office. May consider second opinion consult with Dr Teresa Coombs if events continue. Healthy lifestyle habits encouraged. He will follow up with me in 6-8 months, sooner if needed. His mother verbalizes understanding and agreement with this plan.    Orders Placed This Encounter  Procedures   Lacosamide   Levetiracetam level   CLOBAZAM   Carbamazepine level, total   CMP     No orders of the defined types were placed in this encounter.    I spent 30 minutes of face-to-face and non-face-to-face time with patient.  This included previsit chart review, lab review, study review, order entry, electronic health record documentation, patient education.   Shawnie Dapper, MSN, FNP-C 04/11/2023, 9:30 AM  [facility] [address], [address] [place], [place] [address] [phone]

## 2023-04-11 ENCOUNTER — Ambulatory Visit (INDEPENDENT_AMBULATORY_CARE_PROVIDER_SITE_OTHER): Payer: Medicaid Other | Admitting: Family Medicine

## 2023-04-11 ENCOUNTER — Encounter: Payer: Self-pay | Admitting: Family Medicine

## 2023-04-11 VITALS — BP 150/100 | HR 83 | Ht 74.0 in | Wt 253.0 lb

## 2023-04-11 DIAGNOSIS — G40909 Epilepsy, unspecified, not intractable, without status epilepticus: Secondary | ICD-10-CM | POA: Diagnosis not present

## 2023-04-11 DIAGNOSIS — R569 Unspecified convulsions: Secondary | ICD-10-CM

## 2023-04-11 DIAGNOSIS — F840 Autistic disorder: Secondary | ICD-10-CM

## 2023-04-11 NOTE — Patient Instructions (Addendum)
Below is our plan:  We will continue levetiracetam 1500mg , carbamazepine 600mg , Vimpat (Brand medically necessary) 200mg  BID and Onfi 5mg  twice daily. Please call PCP to discuss concerns of possible syncopal events and elevated blood pressure. Please look at getting a blood pressure cuff to use at home.   Please make sure you are consistent with timing of seizure medication. I recommend annual visit with primary care provider (PCP) for complete physical and routine blood work. I recommend daily intake of vitamin D (400-800iu) and calcium (800-1000mg ) for bone health. Discuss Dexa screening with PCP.   According to [place] law, you can not drive unless you are seizure / syncope free for at least 6 months and under physician's care.  Please maintain precautions. Do not participate in activities where a loss of awareness could harm you or someone else. No swimming alone, no tub bathing, no hot tubs, no driving, no operating motorized vehicles (cars, ATVs, motocycles, etc), lawnmowers, power tools or firearms. No standing at heights, such as rooftops, ladders or stairs. Avoid hot objects such as stoves, heaters, open fires. Wear a helmet when riding a bicycle, scooter, skateboard, etc. and avoid areas of traffic. Set your water heater to 120 degrees or less.  SUDEP is the sudden, unexpected death of someone with epilepsy, who was otherwise healthy. In SUDEP cases, no other cause of death is found when an autopsy is done. Each year, more than 1 in 1,000 people with epilepsy die from SUDEP. This is the leading cause of death in people with uncontrolled seizures. Until further answers are available, the best way to prevent SUDEP is to lower your risk by controlling seizures. Research has found that people with all types of epilepsy that experience convulsive seizures can be at risk.  Please make sure you are staying well hydrated. I recommend 50-60 ounces daily. Well balanced diet and regular exercise encouraged.  Consistent sleep schedule with 6-8 hours recommended.   Please continue follow up with care team as directed.   Follow up with me in 6 months   You may receive a survey regarding today's visit. I encourage you to leave honest feed back as I do use this information to improve patient care. Thank you for seeing me today!

## 2023-04-19 LAB — COMPREHENSIVE METABOLIC PANEL
ALT: 23 [IU]/L (ref 0–44)
AST: 24 [IU]/L (ref 0–40)
Albumin: 4.4 g/dL (ref 4.3–5.2)
Alkaline Phosphatase: 135 [IU]/L — ABNORMAL HIGH (ref 44–121)
BUN/Creatinine Ratio: 9 (ref 9–20)
BUN: 9 mg/dL (ref 6–20)
Bilirubin Total: 0.3 mg/dL (ref 0.0–1.2)
CO2: 24 mmol/L (ref 20–29)
Calcium: 9.7 mg/dL (ref 8.7–10.2)
Chloride: 101 mmol/L (ref 96–106)
Creatinine, Ser: 0.95 mg/dL (ref 0.76–1.27)
Globulin, Total: 3.3 g/dL (ref 1.5–4.5)
Glucose: 99 mg/dL (ref 70–99)
Potassium: 4.2 mmol/L (ref 3.5–5.2)
Sodium: 142 mmol/L (ref 134–144)
Total Protein: 7.7 g/dL (ref 6.0–8.5)
eGFR: 115 mL/min/{1.73_m2} (ref >59)

## 2023-04-19 LAB — CARBAMAZEPINE LEVEL, TOTAL: Carbamazepine (Tegretol), S: 10.6 ug/mL (ref 4.0–12.0)

## 2023-04-19 LAB — CLOBAZAM
Clobazam: 45 ng/mL (ref 30–300)
Desmethylclobazam: 359 ng/mL (ref 300–3000)

## 2023-04-19 LAB — LACOSAMIDE: Lacosamide: 5.5 ug/mL (ref 5.0–10.0)

## 2023-04-19 LAB — LEVETIRACETAM LEVEL: Levetiracetam Lvl: 37.8 ug/mL (ref 10.0–40.0)

## 2023-05-23 ENCOUNTER — Ambulatory Visit: Payer: Medicaid Other

## 2023-05-23 DIAGNOSIS — Z23 Encounter for immunization: Secondary | ICD-10-CM

## 2023-05-24 ENCOUNTER — Encounter: Payer: Self-pay | Admitting: Family Medicine

## 2023-05-24 MED ORDER — CLOBAZAM 2.5 MG/ML PO SUSP: 5.0000 mg | Freq: Two times a day (BID) | ORAL | 1 refills | Status: DC

## 2023-05-24 MED ORDER — VIMPAT 200 MG PO TABS: 200.0000 mg | ORAL_TABLET | Freq: Two times a day (BID) | ORAL | 1 refills | Status: DC

## 2023-05-24 NOTE — Progress Notes (Incomplete Revision)
 Patient presents to nurse clinic with mother for flu and COVID vaccinations.   Administered vaccinations with no complications.   Monitored patient post COVID vaccination for 10 minutes, no signs of adverse reaction.   Veronda Prude, RN

## 2023-05-24 NOTE — Progress Notes (Cosign Needed Addendum)
Patient presents to nurse clinic with mother for flu and COVID vaccinations.   Administered vaccinations with no complications.   Monitored patient post COVID vaccination for 10 minutes, no signs of adverse reaction.   Veronda Prude, RN

## 2023-05-24 NOTE — Telephone Encounter (Addendum)
Per recent note on 04/11/23 " He will continue levetiracetam 1500mg , lacosamide 200mg , carbamazepine 600mg  twice daily and Onfi 5mg  twice daily"   Amy mother asked if clobazam can be a 90 day supply for future refills? It was last filled on 05/21/23 #30 day supply (120 ml) Vimpat last filled on 02/23/23 #180 tablets (90 day supply) Rx pending to be signed

## 2023-06-20 ENCOUNTER — Encounter: Payer: Self-pay | Admitting: Family Medicine

## 2023-06-20 ENCOUNTER — Ambulatory Visit (INDEPENDENT_AMBULATORY_CARE_PROVIDER_SITE_OTHER): Payer: Medicaid Other | Admitting: Family Medicine

## 2023-06-20 VITALS — BP 146/112 | HR 85 | Ht 74.0 in | Wt 256.0 lb

## 2023-06-20 DIAGNOSIS — I10 Essential (primary) hypertension: Secondary | ICD-10-CM

## 2023-06-20 MED ORDER — LOSARTAN POTASSIUM-HCTZ 50-12.5 MG PO TABS: 1.0000 | ORAL_TABLET | Freq: Every day | ORAL | 1 refills | Status: DC

## 2023-06-20 NOTE — Progress Notes (Signed)
    SUBJECTIVE:   CHIEF COMPLAINT / HPI:   Blood pressure readings - Mom reports multiple Blood pressure readings at his neurology office, was advised by neurologist to see PCP for possible blood pressure medication - States that when she is taking it at home it has been elevated as high as 160 systolic - Asymptomatic with his elevated blood pressures - Reports family history of hypertension in multiple family members  PERTINENT  PMH / PSH: Seizures, autism  OBJECTIVE:   BP (!) 146/112   Pulse 85   Ht 6\' 2"  (1.88 m)   Wt 256 lb (116.1 kg)   SpO2 98%   BMI 32.87 kg/m   GEN: Well-appearing, no acute distress Cardio: Regular rate and rhythm, no murmurs Respiratory: CTAB, normal work of breathing on room air Neuro: Alert and oriented to person. Congenital cognitive delay. Follows simple commands.   ASSESSMENT/PLAN:   Hypertension Elevated blood pressure today 146/112.  Upon chart review, patient has had multiple elevated blood pressure readings at this neurology office.  Also elevated blood pressures at home on mom's check. - Will start losartan hydrochlorothiazide 50-12.5 mg daily - BMP obtained today - Advised to follow-up in 1 week for blood pressure recheck and evaluate how the medication is doing - Strict return precautions reviewed with mom.  If he has any concerning side effects from the medication, severe headache, chest pain, shortness of breath please call the office or go to the emergency room.     Para March, DO [facility]

## 2023-06-20 NOTE — Assessment & Plan Note (Signed)
Elevated blood pressure today 146/112.  Upon chart review, patient has had multiple elevated blood pressure readings at this neurology office.  Also elevated blood pressures at home on mom's check. - Will start losartan hydrochlorothiazide 50-12.5 mg daily - BMP obtained today - Advised to follow-up in 1 week for blood pressure recheck and evaluate how the medication is doing - Strict return precautions reviewed with mom.  If he has any concerning side effects from the medication, severe headache, chest pain, shortness of breath please call the office or go to the emergency room.

## 2023-06-20 NOTE — Patient Instructions (Addendum)
It was great to see you today! Thank you for choosing [facility] for your primary care. Billy Ayala was seen for high blood pressure.  Today we addressed: I am starting a medication called Losartan-hydrochlorothiazide 50-12.5mg  for [name]'s blood pressure. I am also checking some lab work. Please come back in 1 week for recheck.   If you haven't already, sign up for My Chart to have easy access to your labs results, and communication with your primary care physician.  We are checking some labs today. If they are abnormal, I will call you. If they are normal, I will send you a MyChart message (if it is active) or a letter in the mail. If you do not hear about your labs in the next 2 weeks, please call the office.  You should return to our clinic Return in about 1 week (around 06/27/2023) for BP check. Please arrive 15 minutes before your appointment to ensure smooth check in process.  We appreciate your efforts in making this happen.  Thank you for allowing me to participate in your care, Para March, DO 06/20/2023, 3:37 PM PGY-1, [facility]

## 2023-06-21 LAB — BASIC METABOLIC PANEL
BUN/Creatinine Ratio: 10 (ref 9–20)
BUN: 11 mg/dL (ref 6–20)
CO2: 24 mmol/L (ref 20–29)
Calcium: 9.6 mg/dL (ref 8.7–10.2)
Chloride: 99 mmol/L (ref 96–106)
Creatinine, Ser: 1.06 mg/dL (ref 0.76–1.27)
Glucose: 89 mg/dL (ref 70–99)
Potassium: 4.2 mmol/L (ref 3.5–5.2)
Sodium: 140 mmol/L (ref 134–144)
eGFR: 101 mL/min/{1.73_m2} (ref >59)

## 2023-07-03 ENCOUNTER — Ambulatory Visit (INDEPENDENT_AMBULATORY_CARE_PROVIDER_SITE_OTHER): Payer: Medicaid Other | Admitting: Family Medicine

## 2023-07-03 ENCOUNTER — Encounter: Payer: Self-pay | Admitting: Family Medicine

## 2023-07-03 ENCOUNTER — Other Ambulatory Visit: Payer: Self-pay | Admitting: Family Medicine

## 2023-07-03 VITALS — BP 162/92 | HR 130 | Ht 74.0 in | Wt 251.4 lb

## 2023-07-03 DIAGNOSIS — I159 Secondary hypertension, unspecified: Secondary | ICD-10-CM | POA: Diagnosis present

## 2023-07-03 MED ORDER — LOSARTAN POTASSIUM-HCTZ 100-25 MG PO TABS: 1.0000 | ORAL_TABLET | Freq: Every day | ORAL | 0 refills | Status: DC

## 2023-07-03 NOTE — Assessment & Plan Note (Addendum)
Uncontrolled, still elevated on 2 office readings today.  I suspect his high blood pressure could be related to genetics but will continue to investigate secondary causes.  Will increase Hyzaar to 100/25 mg - BMP, UA, urine microalbumin/creatinine obtained today - If labs abnormal consider kidney ultrasound - Advised to keep blood pressure log for next appointment - Advised to follow-up in 2 weeks for recheck - Continue to watch salt intake - Return precautions discussed

## 2023-07-03 NOTE — Progress Notes (Addendum)
    SUBJECTIVE:   CHIEF COMPLAINT / HPI:   Hypertension - Started Hyzaar 50-12.5 mg 12/17 - Patient has been taking it daily since then with no side effects - Mom has not checked blood pressure at home since starting the medication  PERTINENT  PMH / PSH: Seizures, autism, sickle cell trait  OBJECTIVE:   BP (!) 162/92   Pulse (!) 130   Ht 6\' 2"  (1.88 m)   Wt 251 lb 6.4 oz (114 kg)   SpO2 98%   BMI 32.28 kg/m   GEN: Well-appearing, no acute distress, playing on ipad Neuro: Alert and oriented to person. Congenital cognitive delay. Follows simple commands.   ASSESSMENT/PLAN:   Hypertension Uncontrolled, still elevated on 2 office readings today.  I suspect his high blood pressure could be related to genetics but will continue to investigate secondary causes.  Will increase Hyzaar to 100/25 mg - BMP, UA, urine microalbumin/creatinine obtained today - If labs abnormal consider kidney ultrasound - Advised to keep blood pressure log for next appointment - Advised to follow-up in 2 weeks for recheck - Continue to watch salt intake - Return precautions discussed     Para March, DO [facility]

## 2023-07-03 NOTE — Patient Instructions (Addendum)
It was great to see you today! Thank you for choosing [facility] for your primary care. Adrain Q Cirrito was seen for high blood pressure.  Today we addressed: Dariusz's blood pressure is still elevated.  I have increased the dose of his blood pressure medicine.  Please keep a log of his blood pressures and return in 2 weeks for follow-up. We are checking some labs and his urine today.  If these results are abnormal I will give you a call.  If you haven't already, sign up for My Chart to have easy access to your labs results, and communication with your primary care physician.  We are checking some labs today. If they are abnormal, I will call you. If they are normal, I will send you a MyChart message (if it is active) or a letter in the mail. If you do not hear about your labs in the next 2 weeks, please call the office.  You should return to our clinic Return in about 2 weeks (around 07/17/2023) for BP f/u. Please arrive 15 minutes before your appointment to ensure smooth check in process.  We appreciate your efforts in making this happen.  Thank you for allowing me to participate in your care, Para March, DO 07/03/2023, 10:37 AM PGY-1, [facility]

## 2023-07-07 ENCOUNTER — Encounter: Payer: Self-pay | Admitting: Family Medicine

## 2023-07-07 LAB — URINALYSIS, ROUTINE W REFLEX MICROSCOPIC

## 2023-07-07 LAB — BASIC METABOLIC PANEL
BUN/Creatinine Ratio: 12 (ref 9–20)
BUN: 11 mg/dL (ref 6–20)
CO2: 21 mmol/L (ref 20–29)
Calcium: 9.5 mg/dL (ref 8.7–10.2)
Chloride: 98 mmol/L (ref 96–106)
Creatinine, Ser: 0.95 mg/dL (ref 0.76–1.27)
Glucose: 105 mg/dL — ABNORMAL HIGH (ref 70–99)
Potassium: 4.4 mmol/L (ref 3.5–5.2)
Sodium: 139 mmol/L (ref 134–144)
eGFR: 115 mL/min/{1.73_m2} (ref >59)

## 2023-07-07 LAB — MICROALBUMIN / CREATININE URINE RATIO

## 2023-07-21 ENCOUNTER — Ambulatory Visit: Payer: Medicaid Other | Admitting: Family Medicine

## 2023-07-21 NOTE — Progress Notes (Deleted)
    SUBJECTIVE:   CHIEF COMPLAINT / HPI:   ***  PERTINENT  PMH / PSH: ***  OBJECTIVE:   There were no vitals taken for this visit.  ***  ASSESSMENT/PLAN:   No problem-specific Assessment & Plan notes found for this encounter.     Para March, DO [facility]

## 2023-08-03 ENCOUNTER — Encounter: Payer: Self-pay | Admitting: Family Medicine

## 2023-08-03 ENCOUNTER — Ambulatory Visit (INDEPENDENT_AMBULATORY_CARE_PROVIDER_SITE_OTHER): Payer: Medicaid Other | Admitting: Family Medicine

## 2023-08-03 VITALS — BP 125/85 | HR 99 | Ht 74.0 in | Wt 248.0 lb

## 2023-08-03 DIAGNOSIS — I158 Other secondary hypertension: Secondary | ICD-10-CM | POA: Diagnosis present

## 2023-08-03 NOTE — Assessment & Plan Note (Signed)
Stable on Hyzaar 100-25mg , BP improved today! 125/85.  Will continue current regimen - obtained microalbumin/creatinine ratio today - will plan to f/u in 3 months

## 2023-08-03 NOTE — Progress Notes (Addendum)
    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 125/85.  Will continue current regimen - obtained microalbumin/creatinine ratio today - will plan to f/u in 3 months   Para March, DO [facility]

## 2023-08-03 NOTE — Patient Instructions (Signed)
It was great to see you today! Thank you for choosing [facility] for your primary care. Billy Ayala was seen for blood pressure.  Today we addressed: Blood pressure-continue current medication.  We will follow-up in 3 months.  I will call you if your urine test was abnormal

## 2023-08-04 ENCOUNTER — Encounter: Payer: Self-pay | Admitting: Family Medicine

## 2023-08-04 LAB — MICROALBUMIN / CREATININE URINE RATIO
Creatinine, Urine: 120.4 mg/dL
Microalb/Creat Ratio: 7 mg/g{creat} (ref 0–29)
Microalbumin, Urine: 8.4 ug/mL

## 2023-08-14 ENCOUNTER — Encounter: Payer: Self-pay | Admitting: Family Medicine

## 2023-08-15 ENCOUNTER — Telehealth: Payer: Self-pay

## 2023-08-15 NOTE — Telephone Encounter (Signed)
Attempted to call patient to schedule an appointment per Dr. Mliss Sax request.  Left generic VM for patient to call office back to schedule an appointment.  If patient calls back, please schedule an appointment regarding their interest in "Psychological evaluation" per Dr. Mliss Sax request.  Drusilla Kanner, CMA

## 2023-08-16 ENCOUNTER — Other Ambulatory Visit: Payer: Self-pay | Admitting: Family Medicine

## 2023-08-16 MED ORDER — LOSARTAN POTASSIUM-HCTZ 100-25 MG PO TABS: 1.0000 | ORAL_TABLET | Freq: Every day | ORAL | 0 refills | Status: DC

## 2023-08-22 ENCOUNTER — Telehealth: Payer: Self-pay | Admitting: Family Medicine

## 2023-08-22 ENCOUNTER — Encounter: Payer: Self-pay | Admitting: Family Medicine

## 2023-08-22 ENCOUNTER — Other Ambulatory Visit: Payer: Self-pay

## 2023-08-22 MED ORDER — VIMPAT 200 MG PO TABS: 200.0000 mg | ORAL_TABLET | Freq: Two times a day (BID) | ORAL | 1 refills | Status: DC

## 2023-08-22 NOTE — Telephone Encounter (Signed)
 Called and relayed:   Ok to switch to generic. Just have mom watch closely for any concerns of seizure activity that is new or unusual and let us know of any concerns.   Mother would like to call around to pharmacies 1st and then let us know before switching from vimpat 200bid to lacosamide. She will call back around 3pm

## 2023-08-22 NOTE — Telephone Encounter (Signed)
 Pt's mother said [facility] is out of VIMPAT 200 MG TABS tablet. Have found generic brand at [facility]. Could send a prescription for the generic brand to [address], [place], [address]. Phone: [phone]

## 2023-08-22 NOTE — Telephone Encounter (Signed)
 Based on mychart msg can you resend vimpat 200mg  bid to      08/22/23  2:18 PM    I do apologize  can we send a prescription for Vimpat 200 mg to [facility] at [address] [place], [place], [place] [address]  [phone].  they have it in stock

## 2023-08-22 NOTE — Telephone Encounter (Signed)
 Last seen 04/11/23, next appt 11/21/23  Dispenses   Dispensed Days Supply Quantity Provider Pharmacy  VIMPAT 200 MG TABLET 05/25/2023 90 180 each Shawnie Dapper, NP [facility] [phone] - G...  VIMPAT 200 MG TABLET 02/23/2023 90 180 each Lomax, Amy, NP [facility] [phone] - G...  VIMPAT 200 MG TABLET 01/26/2023 30 60 each Lomax, Amy, NP [id number] - [facility]...  VIMPAT 200 MG TABLET 10/25/2022 90 180 each Lomax, Amy, NP [facility] [phone] - G.Marland KitchenMarland Kitchen

## 2023-08-23 ENCOUNTER — Other Ambulatory Visit: Payer: Self-pay | Admitting: *Deleted

## 2023-08-23 MED ORDER — LEVETIRACETAM 100 MG/ML PO SOLN: 1500.0000 mg | Freq: Two times a day (BID) | ORAL | 2 refills | Status: DC

## 2023-08-24 ENCOUNTER — Other Ambulatory Visit: Payer: Self-pay | Admitting: Family Medicine

## 2023-08-24 NOTE — Telephone Encounter (Signed)
Brand

## 2023-09-13 ENCOUNTER — Other Ambulatory Visit: Payer: Self-pay | Admitting: Family Medicine

## 2023-10-16 ENCOUNTER — Encounter: Payer: Self-pay | Admitting: Family Medicine

## 2023-10-16 MED ORDER — CARBAMAZEPINE ER 200 MG PO CP12: 600.0000 mg | ORAL_CAPSULE | Freq: Two times a day (BID) | ORAL | 0 refills | Status: DC

## 2023-10-16 NOTE — Telephone Encounter (Signed)
 Follow up scheduled on 11/03/23 Last seen on 04/11/23

## 2023-11-20 NOTE — Progress Notes (Signed)
 Chief Complaint  Patient presents with   Follow-up    Pt in room 2 mother in room. Here for seizure follow up. No recent seizures. No concerns.      HISTORY OF PRESENT ILLNESS:  11/21/23 ALL:  Thong returns for follow up for seizures. He was last seen by me 04/2023 and mom reported questionable syncope events. EEG normal. We continued levetiracetam  1500mg , carbamazepine  600mg  and Vimpat  (Brand medically necessary) 200mg  BID and advised close follow up with PCP. Since, mom reports he has done well. Ni further syncope events since starting Hyzzar a few months ago. BP has been normal. No seizures. He seems to be tolerating meds well. Sleeping well. No concerns, today.   04/11/2023 ALL:  Harsh returns for follow up for seizures. He was last seen 09/2022 and reported breakthrough seizure 04/2022 in setting of COVID. He was doing well at the time and continued levetiracetam  1500mg , carbamazepine  600mg  and Vimpat  (Brand medically necessary) 200mg  BID. Mom called to report he had 2 seizures in June and 1 in July. Ambulatory EEG was normal. Onfi  5mg  BID added. Since, mom reports he is doing well. No further events. Mom described the last three events occurred similarly. He would make an usual gasping noise and fall backwards. He appeared to be unconscious for approx 5 seconds then seemed back to baseline. No tonic clonic activity. No post ictal period. His blood pressure is always elevated at our visits. Mom reports BP is elevated every time he sees a doctor. She does not have a cuff at home. He stays well hydrated. Great appetite. He seems to be doing well from a mood/behavioral standpoint.   09/07/2022 ALL: Elim returns for follow up for seizures. He continues  levetiracetam  1500mg , carbamazepine  600mg  and Vimpat  (Brand medically necessary) 200mg  BID.  Mom called to report concerns of possible breakthrough seizure activity in 04/2022. Labs were normal. No changes in treatment plan. He was seen in  the ER 08/17/2022 for concerns of syncope. Lacosamide  historically low. 1.8 in ER. Lev and carb levels normal. He was positive for COVID. IV fluids given and he was discharged home.   Since, mom reports he has done well. He seems to have recovered from COVID. He is staying well hydrated. Sleeping well. No seizure events. He is taking medicaitons at 7:30am and 7:30pm.   09/06/2021 ALL: JORDANY RUSSETT is a 23 y.o. male here today for follow up for seizure disorder. He continues levetiracetam  1500mg , carbamazepine  600mg  and Vimpat  200mg  BID. He is doing well. No seizure activity.   Mom reports BP is always elevated at MD office. Readings always recover once home. She denies any obvious symptoms of elevated BP at home. He is sleeping well. He has a good appetite. He does not drive. He has not had recent labs. He is fearful of needles. She requests to update labs with upcoming PCP visit for Covid booster.   HISTORY (copied from Dr Elon Hakim previous note)  UPDATE (04/27/20, VRP): Since last visit, doing well. Symptoms are stable. Severity is mild. No alleviating or aggravating factors. Tolerating meds. Last sz in Nov 2020.   UPDATE (04/22/19, VRP): Since last visit, doing well. Symptoms are stable. No seizures. No alleviating or aggravating factors. Tolerating meds. Some crying spells intermittently.    UPDATE (09/03/18, VRP): Since last visit, had mild sz (10-15 seconds) on 07/24/18. Went to ER and was stable. Since then, now on higher CBZ dosing and doing well. No more events. Intermittent mild stuttering. No  other side effects.    UPDATE (05/14/18, VRP): Since last visit, had another seizure and admission in oct 2019. Had aspiration, resp failure, hemoptysis, and ICU support. Now on vimpat  + levetiracetam . Some decr appetite and energy. No more seizures.     UPDATE (03/19/18, VRP): Since last visit, had 2 more seizures. LEV has been increased. Last sz no 03/11/18. No alleviating or aggravating factors.  Tolerating medication.    PRIOR HPI (02/27/18): 23 year old male here for evaluation of seizures.  History of autism.   June 2019 patient was at home, collapsed and had generalized convulsions.  Patient was taken to the emergency room and then began to cough and vomit blood.  Patient was also diagnosed with diffuse alveolar hemorrhage.  He was started on antiseizure medication.  No specific etiology for hemoptysis was found.   July 2019 patient returned to the hospital for another seizure.  Again patient began to have pulmonary edema and hemoptysis.  Patient was immediately started on steroids and antiseizure medication dose was increased.   Since that time patient has been doing well.  No further seizures or hemoptysis.  He has been following up with PCP and pulmonary clinic.  He has additional pulmonary testing scheduled for October 2019.   No prior history of seizures.  There is family history of seizure in patient's father and paternal aunt.  No side effects of antiseizure medication at this time.   REVIEW OF SYSTEMS: Out of a complete 14 system review of symptoms, the patient complains only of the following symptoms, cognitive delay, congenital and all other reviewed systems are negative.   ALLERGIES: No Known Allergies   HOME MEDICATIONS: Outpatient Medications Prior to Visit  Medication Sig Dispense Refill   diphenhydrAMINE HCl, Sleep, (ZZZQUIL PO) Take 30 mLs by mouth at bedtime.     doxycycline  (VIBRA -TABS) 100 MG tablet Take 1 tablet (100 mg total) by mouth daily. 30 tablet 0   losartan -hydrochlorothiazide (HYZAAR) 100-25 MG tablet TAKE 1 TABLET BY MOUTH EVERY DAY 30 tablet 3   triamcinolone  ointment (KENALOG ) 0.5 % Apply 1 Application topically 2 (two) times daily. 30 g 0   carbamazepine  (CARBATROL ) 200 MG 12 hr capsule Take 3 capsules (600 mg total) by mouth 2 (two) times daily. 540 capsule 0   cloBAZam  (ONFI ) 2.5 MG/ML solution Take 2 mLs (5 mg total) by mouth 2 (two) times  daily. 360 mL 1   levETIRAcetam  (KEPPRA ) 100 MG/ML solution Take 15 mLs (1,500 mg total) by mouth 2 (two) times daily. 2700 mL 2   VIMPAT  200 MG TABS tablet Take 1 tablet (200 mg total) by mouth 2 (two) times daily. Brand Name medically necessary 180 tablet 1   No facility-administered medications prior to visit.     PAST MEDICAL HISTORY: Past Medical History:  Diagnosis Date   Autism    Encounter for imaging study to confirm orogastric (OG) tube placement    Endotracheally intubated    Hemoptysis 01/14/2018   Hypoxia    Impaired oral gastric feeding tube    Seizure (HCC)    sz 07/24/2018   Sickle cell trait (HCC)      PAST SURGICAL HISTORY: History reviewed. No pertinent surgical history.   FAMILY HISTORY: Family History  Problem Relation Age of Onset   Asthma Mother    Hypertension Father    Sickle cell trait Father    Bipolar disorder Father    Asthma Sister    Hypertension Maternal Grandmother    Early death Neg Hx  SOCIAL HISTORY: Social History   Socioeconomic History   Marital status: Single    Spouse name: Not on file   Number of children: Not on file   Years of education: Not on file   Highest education level: 12th grade  Occupational History   Not on file  Tobacco Use   Smoking status: Never   Smokeless tobacco: Never  Vaping Use   Vaping status: Never Used  Substance and Sexual Activity   Alcohol use: Never   Drug use: Never   Sexual activity: Not on file  Other Topics Concern   Not on file  Social History Narrative   Lives with mom, three half brothers and boyfriend      Goes to [employer] full school days (6th grader)   Right handed    No caffeine    Social Drivers of Corporate investment banker Strain: Low Risk  (06/19/2023)   Overall Financial Resource Strain (CARDIA)    Difficulty of Paying Living Expenses: Not hard at all  Food Insecurity: No Food Insecurity (06/19/2023)   Hunger Vital Sign    Worried About Running Out of  Food in the Last Year: Never true    Ran Out of Food in the Last Year: Never true  Transportation Needs: No Transportation Needs (06/19/2023)   PRAPARE - Administrator, Civil Service (Medical): No    Lack of Transportation (Non-Medical): No  Physical Activity: Unknown (06/19/2023)   Exercise Vital Sign    Days of Exercise per Week: 0 days    Minutes of Exercise per Session: Not on file  Stress: No Stress Concern Present (06/19/2023)   [employer] of Occupational Health - Occupational Stress Questionnaire    Feeling of Stress : Not at all  Social Connections: Moderately Isolated (06/19/2023)   Social Connection and Isolation Panel [NHANES]    Frequency of Communication with Friends and Family: Never    Frequency of Social Gatherings with Friends and Family: More than three times a week    Attends Religious Services: 1 to 4 times per year    Active Member of [employer] or Organizations: No    Attends Engineer, structural: Not on file    Marital Status: Never married  Intimate Partner Violence: Not on file     PHYSICAL EXAM  Vitals:   11/21/23 0859  BP: 129/82  Pulse: 88  Weight: 251 lb 9.6 oz (114.1 kg)  Height: 6\' 2"  (1.88 m)      Body mass index is 32.3 kg/m.  Generalized: Well developed, in no acute distress  Cardiology: normal rate and rhythm, no murmur auscultated  Respiratory: clear to auscultation bilaterally    Neurological examination  Mentation: Alert, oriented to person, decreased memory, congenital cognitive delays, Follows simple commands speech and language fluent Cranial nerve II-XII: Pupils were equal round reactive to light. Extraocular movements were full, visual field were full on confrontational test. Facial sensation and strength were normal. Head turning and shoulder shrug  were normal and symmetric. Motor: The motor testing reveals 5 over 5 strength of all 4 extremities. Good symmetric motor tone is noted throughout.   Gait  and station: Gait is normal.    DIAGNOSTIC DATA (LABS, IMAGING, TESTING) - I reviewed patient records, labs, notes, testing and imaging myself where available.  Lab Results  Component Value Date   WBC 9.2 08/17/2022   HGB 14.9 08/17/2022   HCT 45.2 08/17/2022   MCV 78.2 (L) 08/17/2022   PLT  321 08/17/2022      Component Value Date/Time   NA 139 07/03/2023 1049   K 4.4 07/03/2023 1049   CL 98 07/03/2023 1049   CO2 21 07/03/2023 1049   GLUCOSE 105 (H) 07/03/2023 1049   GLUCOSE 120 (H) 08/17/2022 0950   BUN 11 07/03/2023 1049   CREATININE 0.95 07/03/2023 1049   CALCIUM 9.5 07/03/2023 1049   PROT 7.7 04/11/2023 0840   ALBUMIN 4.4 04/11/2023 0840   AST 24 04/11/2023 0840   ALT 23 04/11/2023 0840   ALKPHOS 135 (H) 04/11/2023 0840   BILITOT 0.3 04/11/2023 0840   GFRNONAA >60 08/17/2022 0950   GFRAA >60 05/03/2018 0955   Lab Results  Component Value Date   TRIG 142 04/30/2018   No results found for: "HGBA1C" Lab Results  Component Value Date   VITAMINB12 248 01/15/2018   Lab Results  Component Value Date   TSH 0.422 12/14/2017        No data to display               No data to display           ASSESSMENT AND PLAN  23 y.o. year old male  has a past medical history of Autism, Encounter for imaging study to confirm orogastric (OG) tube placement, Endotracheally intubated, Hemoptysis (01/14/2018), Hypoxia, Impaired oral gastric feeding tube, Seizure (HCC), and Sickle cell trait (HCC). here with    Seizure disorder (HCC)  Seizure-like activity (HCC)  Autism spectrum disorder  Nicholi is doing well, today. Syncopal events have resolved with HTN management. Ambulatory EEG was normal. Labs unremarkable 04/2023. He will continue levetiracetam  1500mg , lacosamide  200mg , carbamazepine  600mg  and Onfi  5mg  twice daily. Seizure precautions advised. Healthy lifestyle habits encouraged. He will follow up with Dr Salli Crawley in 1 year for review of care plan due to  complexity. I anticipate follow up with me, thereafter. His mother verbalizes understanding and agreement with this plan.    No orders of the defined types were placed in this encounter.    Meds ordered this encounter  Medications   cloBAZam  (ONFI ) 2.5 MG/ML solution    Sig: Take 2 mLs (5 mg total) by mouth 2 (two) times daily.    Dispense:  360 mL    Refill:  1   levETIRAcetam  (KEPPRA ) 100 MG/ML solution    Sig: Take 15 mLs (1,500 mg total) by mouth 2 (two) times daily.    Dispense:  2700 mL    Refill:  3   VIMPAT  200 MG TABS tablet    Sig: Take 1 tablet (200 mg total) by mouth 2 (two) times daily. Brand Name medically necessary    Dispense:  180 tablet    Refill:  1    BRAND NAME ONLY   carbamazepine  (CARBATROL ) 200 MG 12 hr capsule    Sig: Take 3 capsules (600 mg total) by mouth 2 (two) times daily.    Dispense:  540 capsule    Refill:  3      Zamier Eggebrecht, MSN, FNP-C 11/21/2023, 9:16 AM  [facility] [address], [address] [place], [place] [address] [phone]

## 2023-11-20 NOTE — Patient Instructions (Signed)
 Below is our plan:  We will levetiracetam  1500mg , carbamazepine  600mg  and Vimpat  (Brand medically necessary) 200mg  twice daily.  Please make sure you are consistent with timing of seizure medication. I recommend annual visit with primary care provider (PCP) for complete physical and routine blood work. I recommend daily intake of vitamin D (400-800iu) and calcium (800-1000mg ) for bone health. Discuss Dexa screening with PCP.   According to [place] law, you can not drive unless you are seizure / syncope free for at least 6 months and under physician's care.  Please maintain precautions. Do not participate in activities where a loss of awareness could harm you or someone else. No swimming alone, no tub bathing, no hot tubs, no driving, no operating motorized vehicles (cars, ATVs, motocycles, etc), lawnmowers, power tools or firearms. No standing at heights, such as rooftops, ladders or stairs. Avoid hot objects such as stoves, heaters, open fires. Wear a helmet when riding a bicycle, scooter, skateboard, etc. and avoid areas of traffic. Set your water heater to 120 degrees or less.  SUDEP is the sudden, unexpected death of someone with epilepsy, who was otherwise healthy. In SUDEP cases, no other cause of death is found when an autopsy is done. Each year, more than 1 in 1,000 people with epilepsy die from SUDEP. This is the leading cause of death in people with uncontrolled seizures. Until further answers are available, the best way to prevent SUDEP is to lower your risk by controlling seizures. Research has found that people with all types of epilepsy that experience convulsive seizures can be at risk.   Please make sure you are staying well hydrated. I recommend 50-60 ounces daily. Well balanced diet and regular exercise encouraged. Consistent sleep schedule with 6-8 hours recommended.   Please continue follow up with care team as directed.   Follow up with Dr Salli Crawley in 1 year   You may receive a  survey regarding today's visit. I encourage you to leave honest feed back as I do use this information to improve patient care. Thank you for seeing me today!

## 2023-11-21 ENCOUNTER — Encounter: Payer: Self-pay | Admitting: Family Medicine

## 2023-11-21 ENCOUNTER — Ambulatory Visit: Payer: Medicaid Other | Admitting: Family Medicine

## 2023-11-21 VITALS — BP 129/82 | HR 88 | Ht 74.0 in | Wt 251.6 lb

## 2023-11-21 DIAGNOSIS — R569 Unspecified convulsions: Secondary | ICD-10-CM

## 2023-11-21 DIAGNOSIS — F840 Autistic disorder: Secondary | ICD-10-CM | POA: Diagnosis not present

## 2023-11-21 DIAGNOSIS — G40909 Epilepsy, unspecified, not intractable, without status epilepticus: Secondary | ICD-10-CM | POA: Diagnosis not present

## 2023-11-21 MED ORDER — CARBAMAZEPINE ER 200 MG PO CP12: 600.0000 mg | ORAL_CAPSULE | Freq: Two times a day (BID) | ORAL | 3 refills | Status: AC

## 2023-11-21 MED ORDER — VIMPAT 200 MG PO TABS: 200.0000 mg | ORAL_TABLET | Freq: Two times a day (BID) | ORAL | 0 refills | Status: DC

## 2023-11-21 MED ORDER — CLOBAZAM 2.5 MG/ML PO SUSP: 5.0000 mg | Freq: Two times a day (BID) | ORAL | 1 refills | Status: DC

## 2023-11-21 MED ORDER — LEVETIRACETAM 100 MG/ML PO SOLN: 1500.0000 mg | Freq: Two times a day (BID) | ORAL | 3 refills | Status: AC

## 2023-11-21 MED ORDER — VIMPAT 200 MG PO TABS: 200.0000 mg | ORAL_TABLET | Freq: Two times a day (BID) | ORAL | 1 refills | Status: DC

## 2024-01-15 ENCOUNTER — Other Ambulatory Visit: Payer: Self-pay | Admitting: Family Medicine

## 2024-02-09 NOTE — Telephone Encounter (Signed)
 SABRA

## 2024-03-18 ENCOUNTER — Encounter: Payer: Self-pay | Admitting: Family Medicine

## 2024-03-18 NOTE — Telephone Encounter (Signed)
 Called [facility] at  [phone] to see if any refills are on file for Vimpat  200mg . Spoke w/ pharmacist. They last dispensed #60 11/21/23., no refills on file.  [facility] [phone]. Appears he filled with them last 12/20/23 #180. Spoke w/ Dale. Has one refill remaining. Needs PA. Pt had a change of insurance. Aware we will send urgent request to PA team since pt low on medication. He now has [employer] per pharmacy.

## 2024-03-18 NOTE — Telephone Encounter (Signed)
 Patient's mother called patient still has medicaid. Would like a call from nurse.

## 2024-03-19 ENCOUNTER — Telehealth: Payer: Self-pay

## 2024-03-19 ENCOUNTER — Other Ambulatory Visit (HOSPITAL_COMMUNITY): Payer: Self-pay

## 2024-03-19 ENCOUNTER — Telehealth: Payer: Self-pay | Admitting: *Deleted

## 2024-03-19 NOTE — Telephone Encounter (Signed)
 Urgent PA needed for Vimpat . Pt almost out of med. Per mom (see pt message encounter from 03/18/24):  Pt's mother states she only has pt's subscriber # from [facility] of [place] [id number] Q, she has not had a card for pt in years. She confirms pt has [employer] with a Rx Bin# [id number] and a [id number] she does not have any other information

## 2024-03-19 NOTE — Telephone Encounter (Signed)
 Pharmacy Patient Advocate Encounter  Received notification from [facility] MEDICAID that Prior Authorization for VIMPAT  has been APPROVED from 03/19/2024 to 03/19/2025   PA #/Case ID/Reference #: N/A

## 2024-03-19 NOTE — Telephone Encounter (Signed)
 Pt's mother has called, and the message from [place], [place] from 7:40 this morning was relayed.  Pt's mother states she only has pt's subscriber # from [place] of [place] [id number] Q, she has not had a card for pt in years.  She confirms pt has [employer] with a Rx Bin# [id number] and a [id number] she does not have any other information, please call pt's mother to discuss further, she is also open to a my chart response.

## 2024-03-19 NOTE — Telephone Encounter (Signed)
 Pharmacy Patient Advocate Encounter   Received notification from Physician's Office that prior authorization for Vimpat  Billy Ayala is required/requested.   Insurance verification completed.   The patient is insured through [facility] MEDICAID .   Per test claim: PA required; PA submitted to above mentioned insurance via Latent Key/confirmation #/EOC [id number] Status is pending  Submitted as an urgent request

## 2024-03-19 NOTE — Telephone Encounter (Signed)
 Sent mychart to pt notifying them about approval.

## 2024-04-05 ENCOUNTER — Telehealth: Payer: Self-pay

## 2024-04-05 ENCOUNTER — Other Ambulatory Visit (HOSPITAL_COMMUNITY): Payer: Self-pay

## 2024-04-05 NOTE — Telephone Encounter (Signed)
 Pharmacy Patient Advocate Encounter   Received notification from CoverMyMeds that prior authorization for Carmamazepine ER 200MG  er capsules is required/requested.   Insurance verification completed.   The patient is insured through [facility] MEDICAID.   Per test claim: PA required; PA started via CoverMyMeds. KEY [id number] . Waiting for clinical questions to populate.

## 2024-04-08 NOTE — Telephone Encounter (Signed)
 Clinical questions have been answered and PA submitted. PA currently Pending. Please be advised that most companies allow up to 30 days to make a decision. We will advise when a determination has been made, or follow up in 1 week.   Please reach out to our team, Rx Prior Auth Pool, if you haven't heard back in a week.

## 2024-04-09 NOTE — Telephone Encounter (Signed)
 Pharmacy Patient Advocate Encounter  Received notification from [facility] MEDICAID that Prior Authorization for Carbamazepine  ER 200mg  ER 12HR Capsule has been APPROVED from 04/09/2024 to 04/09/2025   PA #/Case ID/Reference #: [id number]

## 2024-05-31 ENCOUNTER — Encounter: Payer: Self-pay | Admitting: Family Medicine

## 2024-06-02 MED ORDER — CLOBAZAM 2.5 MG/ML PO SUSP: 5.0000 mg | Freq: Two times a day (BID) | ORAL | 1 refills | Status: AC

## 2024-06-17 ENCOUNTER — Encounter: Payer: Self-pay | Admitting: Family Medicine

## 2024-06-18 ENCOUNTER — Telehealth: Payer: Self-pay | Admitting: Family Medicine

## 2024-06-18 ENCOUNTER — Telehealth: Payer: Self-pay | Admitting: Neurology

## 2024-06-18 ENCOUNTER — Other Ambulatory Visit: Payer: Self-pay

## 2024-06-18 MED ORDER — VIMPAT 200 MG PO TABS: 200.0000 mg | ORAL_TABLET | Freq: Two times a day (BID) | ORAL | 3 refills | Status: DC

## 2024-06-18 MED ORDER — VIMPAT 200 MG PO TABS: 200.0000 mg | ORAL_TABLET | Freq: Two times a day (BID) | ORAL | 0 refills | Status: DC

## 2024-06-18 MED ORDER — VIMPAT 200 MG PO TABS: 200.0000 mg | ORAL_TABLET | Freq: Two times a day (BID) | ORAL | 3 refills | Status: AC

## 2024-06-18 NOTE — Telephone Encounter (Signed)
 See my chart message 06/17/24 regarding this call.

## 2024-06-18 NOTE — Telephone Encounter (Signed)
 Meds ordered this encounter  Medications   VIMPAT  200 MG TABS tablet    Sig: Take 1 tablet (200 mg total) by mouth 2 (two) times daily. Brand Name medically necessary    Dispense:  180 tablet    Refill:  3    BRAND NAME ONLY     Called on call for refill

## 2024-06-18 NOTE — Telephone Encounter (Signed)
 Patient's mother, said pharmacy does not have VIMPAT  200 MG TABS tablet in stock but [facility] [address]  [phone] [facility] in stock. [facility]

## 2024-06-18 NOTE — Telephone Encounter (Signed)
 Last seen on 11/21/23 Follow up scheduled on 11/18/24   Dispensed Days Supply Quantity Provider Pharmacy  VIMPAT  200 MG TABLET 04/15/2024 30 60 each Lomax, Amy, NP [facility] [phone] - G...     Rx pending to be signed

## 2024-06-18 NOTE — Telephone Encounter (Addendum)
 [facility]'s mother [facility] for a 30 day supply, only have 34 tablets. Can you write the prescription for the 30 tablets which would be 15 day supply or write prescription for generic brand. Would like a call back. Patient is out of medication

## 2024-06-18 NOTE — Telephone Encounter (Signed)
 Please see the below, Rx is pending to be sent to the pharmacy that has Rx in stock.

## 2024-07-03 ENCOUNTER — Other Ambulatory Visit: Payer: Self-pay | Admitting: Family Medicine

## 2024-11-18 ENCOUNTER — Ambulatory Visit: Admitting: Diagnostic Neuroimaging
